# Patient Record
Sex: Female | Born: 1995 | Race: White | Hispanic: No | Marital: Single | Smoking: Current every day smoker
Health system: Southern US, Community
[De-identification: ages and names within clinical notes are randomized; demographics above are authoritative.]

## PROBLEM LIST (undated history)

## (undated) DIAGNOSIS — F329 Major depressive disorder, single episode, unspecified: Secondary | ICD-10-CM

## (undated) DIAGNOSIS — F410 Panic disorder [episodic paroxysmal anxiety] without agoraphobia: Secondary | ICD-10-CM

## (undated) DIAGNOSIS — F319 Bipolar disorder, unspecified: Secondary | ICD-10-CM

## (undated) DIAGNOSIS — F32A Depression, unspecified: Secondary | ICD-10-CM

## (undated) DIAGNOSIS — L732 Hidradenitis suppurativa: Secondary | ICD-10-CM

## (undated) DIAGNOSIS — F419 Anxiety disorder, unspecified: Secondary | ICD-10-CM

## (undated) DIAGNOSIS — N730 Acute parametritis and pelvic cellulitis: Secondary | ICD-10-CM

## (undated) DIAGNOSIS — Z789 Other specified health status: Secondary | ICD-10-CM

## (undated) DIAGNOSIS — M1990 Unspecified osteoarthritis, unspecified site: Secondary | ICD-10-CM

## (undated) DIAGNOSIS — F209 Schizophrenia, unspecified: Secondary | ICD-10-CM

## (undated) HISTORY — DX: Bipolar disorder, unspecified: F31.9

---
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
## 2017-01-04 ENCOUNTER — Encounter (HOSPITAL_COMMUNITY): Payer: Self-pay | Admitting: Emergency Medicine

## 2017-01-04 ENCOUNTER — Emergency Department (HOSPITAL_COMMUNITY)
Admission: EM | Admit: 2017-01-04 | Discharge: 2017-01-05 | Disposition: A | Discharge status: Home / Self Care | Payer: BLUE CROSS/BLUE SHIELD | Attending: Emergency Medicine | Admitting: Emergency Medicine

## 2017-01-04 DIAGNOSIS — F32A Depression, unspecified: Secondary | ICD-10-CM | POA: Diagnosis present

## 2017-01-04 DIAGNOSIS — Z5181 Encounter for therapeutic drug level monitoring: Secondary | ICD-10-CM | POA: Insufficient documentation

## 2017-01-04 DIAGNOSIS — F411 Generalized anxiety disorder: Secondary | ICD-10-CM | POA: Diagnosis not present

## 2017-01-04 DIAGNOSIS — F419 Anxiety disorder, unspecified: Secondary | ICD-10-CM | POA: Diagnosis present

## 2017-01-04 DIAGNOSIS — F17200 Nicotine dependence, unspecified, uncomplicated: Secondary | ICD-10-CM | POA: Insufficient documentation

## 2017-01-04 DIAGNOSIS — F39 Unspecified mood [affective] disorder: Secondary | ICD-10-CM | POA: Diagnosis not present

## 2017-01-04 DIAGNOSIS — F329 Major depressive disorder, single episode, unspecified: Secondary | ICD-10-CM | POA: Diagnosis present

## 2017-01-04 HISTORY — DX: Unspecified osteoarthritis, unspecified site: M19.90

## 2017-01-04 NOTE — ED Triage Notes (Addendum)
Pt from home via EMS with complaints of panic attack that has been ongoing intermittently for 2 months. Pt is tearful at time of arrival. Per EMS pt has seen a therapist once, but stopped because she perceived the therapist saying something negative about pt's appearance. Pt states that today has been worse than normal. Pt denies SI/HI. Pt states she has previously was diagnosed with schiophrenia  But she questions the diagnosis because she spent little time with the psychiatrist. Pt has been on different medications, but is currently off meds. Pt is a poor historian and is labile

## 2017-01-05 DIAGNOSIS — F329 Major depressive disorder, single episode, unspecified: Secondary | ICD-10-CM | POA: Diagnosis present

## 2017-01-05 DIAGNOSIS — F411 Generalized anxiety disorder: Secondary | ICD-10-CM | POA: Diagnosis present

## 2017-01-05 DIAGNOSIS — F39 Unspecified mood [affective] disorder: Secondary | ICD-10-CM

## 2017-01-05 DIAGNOSIS — F32A Depression, unspecified: Secondary | ICD-10-CM | POA: Diagnosis present

## 2017-01-05 LAB — CBC WITH DIFFERENTIAL/PLATELET
Basophils Absolute: 0 10*3/uL (ref 0.0–0.1)
Basophils Relative: 0 %
EOS PCT: 0 %
Eosinophils Absolute: 0 10*3/uL (ref 0.0–0.7)
HCT: 40.5 % (ref 36.0–46.0)
Hemoglobin: 14.2 g/dL (ref 12.0–15.0)
LYMPHS ABS: 2.2 10*3/uL (ref 0.7–4.0)
LYMPHS PCT: 16 %
MCH: 30.8 pg (ref 26.0–34.0)
MCHC: 35.1 g/dL (ref 30.0–36.0)
MCV: 87.9 fL (ref 78.0–100.0)
MONO ABS: 0.9 10*3/uL (ref 0.1–1.0)
MONOS PCT: 7 %
Neutro Abs: 10.3 10*3/uL — ABNORMAL HIGH (ref 1.7–7.7)
Neutrophils Relative %: 77 %
PLATELETS: 343 10*3/uL (ref 150–400)
RBC: 4.61 MIL/uL (ref 3.87–5.11)
RDW: 12.8 % (ref 11.5–15.5)
WBC: 13.5 10*3/uL — AB (ref 4.0–10.5)

## 2017-01-05 LAB — BASIC METABOLIC PANEL
Anion gap: 10 (ref 5–15)
BUN: 11 mg/dL (ref 6–20)
CALCIUM: 9.5 mg/dL (ref 8.9–10.3)
CO2: 21 mmol/L — AB (ref 22–32)
CREATININE: 0.72 mg/dL (ref 0.44–1.00)
Chloride: 105 mmol/L (ref 101–111)
GFR calc Af Amer: >60 mL/min (ref >60)
GFR calc non Af Amer: >60 mL/min (ref >60)
GLUCOSE: 129 mg/dL — AB (ref 65–99)
Potassium: 3.9 mmol/L (ref 3.5–5.1)
Sodium: 136 mmol/L (ref 135–145)

## 2017-01-05 LAB — RAPID URINE DRUG SCREEN, HOSP PERFORMED
AMPHETAMINES: NOT DETECTED
Barbiturates: NOT DETECTED
Benzodiazepines: NOT DETECTED
Cocaine: NOT DETECTED
Opiates: NOT DETECTED
TETRAHYDROCANNABINOL: POSITIVE — AB

## 2017-01-05 LAB — SALICYLATE LEVEL: Salicylate Lvl: <7 mg/dL (ref 2.8–30.0)

## 2017-01-05 LAB — ACETAMINOPHEN LEVEL: Acetaminophen (Tylenol), Serum: <10 ug/mL — ABNORMAL LOW (ref 10–30)

## 2017-01-05 LAB — PREGNANCY, URINE: PREG TEST UR: NEGATIVE

## 2017-01-05 LAB — ETHANOL: Alcohol, Ethyl (B): <5 mg/dL (ref <5)

## 2017-01-05 MED ORDER — ONDANSETRON HCL 4 MG PO TABS: 4.0000 mg | ORAL_TABLET | Freq: Three times a day (TID) | ORAL | Status: DC | PRN

## 2017-01-05 MED ORDER — HYDROXYZINE HCL 25 MG PO TABS: 25.0000 mg | ORAL_TABLET | Freq: Four times a day (QID) | ORAL | Status: DC | PRN

## 2017-01-05 MED ORDER — CARBAMAZEPINE ER 200 MG PO TB12: 200.0000 mg | ORAL_TABLET | Freq: Two times a day (BID) | ORAL | Status: DC

## 2017-01-05 MED ORDER — CARBAMAZEPINE ER 200 MG PO TB12: 200.0000 mg | ORAL_TABLET | Freq: Two times a day (BID) | ORAL | 0 refills | Status: DC

## 2017-01-05 MED ORDER — LORAZEPAM 1 MG PO TABS: 1.0000 mg | ORAL_TABLET | Freq: Three times a day (TID) | ORAL | Status: DC | PRN

## 2017-01-05 MED ORDER — ACETAMINOPHEN 325 MG PO TABS: 650.0000 mg | ORAL_TABLET | ORAL | Status: DC | PRN

## 2017-01-05 MED ORDER — HYDROXYZINE HCL 25 MG PO TABS: 25.0000 mg | ORAL_TABLET | Freq: Four times a day (QID) | ORAL | 0 refills | Status: DC | PRN

## 2017-01-05 MED ORDER — LORAZEPAM 1 MG PO TABS
1.0000 mg | ORAL_TABLET | Freq: Once | ORAL | Status: AC
  Administered 2017-01-05: 1 mg via ORAL
  Filled 2017-01-05: qty 1

## 2017-01-05 NOTE — ED Notes (Signed)
Pt appears calm, cooperative, and friendly. Pt friend and mother are at the bedside.

## 2017-01-05 NOTE — [facility] Suicide Risk Assessment (Signed)
Suicide Risk Assessment  Discharge Assessment   [facility]   Principal Problem: Generalized anxiety disorder Discharge Diagnoses:  Patient Active Problem List   Diagnosis Date Noted  . Generalized anxiety disorder [F41.1] 01/05/2017    Priority: High  . Mood disorder (HCC) [F39] 01/05/2017    Priority: High    Total Time spent with patient: 45 minutes  Musculoskeletal: Strength & Muscle Tone: within normal limits Gait & Station: normal Patient leans: N/A  Psychiatric Specialty Exam:   Blood pressure 118/79, pulse 90, temperature 98.9 F (37.2 C), resp. rate 15, height 5\' 4"  (1.626 m), weight 57.5 kg (126 lb 12 oz), SpO2 98 %.Body mass index is 21.76 kg/m.  General Appearance: Casual  Eye Contact::  Good  Speech:  Normal Rate409  Volume:  Normal  Mood:  Anxious  Affect:  Congruent  Thought Process:  Coherent and Descriptions of Associations: Intact  Orientation:  Full (Time, Place, and Person)  Thought Content:  WDL and Logical  Suicidal Thoughts:  No  Homicidal Thoughts:  No  Memory:  Immediate;   Good Recent;   Good Remote;   Good  Judgement:  Fair  Insight:  Fair  Psychomotor Activity:  Normal  Concentration:  Good  Recall:  Good  Fund of Knowledge:Good  Language: Good  Akathisia:  No  Handed:  Right  AIMS (if indicated):     Assets:  Housing Leisure Time Physical Health Resilience Social Support  Sleep:     Cognition: WNL  ADL's:  Intact   Mental Status Per Nursing Assessment::   On Admission:   anxiety, panic attack.  Appointment in place for 4/4, Rx provided  Demographic Factors:  Adolescent or young adult and Caucasian  Loss Factors: NA  Historical Factors: NA  Risk Reduction Factors:   Sense of responsibility to family, Living with another person, especially a relative and Positive social support  Continued Clinical Symptoms:  Anxiety   Cognitive Features That Contribute To Risk:  None    Suicide Risk:   Minimal: No identifiable suicidal ideation.  Patients presenting with no risk factors but with morbid ruminations; may be classified as minimal risk based on the severity of the depressive symptoms    Plan Of Care/Follow-up recommendations:  Activity:  as tolerated Diet:  heart healthy diet  Brecken Walth, NP 01/05/2017, 11:38 AM

## 2017-01-05 NOTE — ED Provider Notes (Signed)
[facility]-EMER[facility]ENCY DEPT Provider Note   CSN: [id number] Arrival date & time: 01/04/17  2336     History   Chief Complaint Chief Complaint  Patient presents with  . Panic Attack    HPI Kimberly Boyle is a 21 y.o. female.  Patient presents to the ED with a chief complaint of anxiety.  She states that it feels like she has been having a panic attack for the past several months. She reports a history of schizophrenia, but is not actively being treated. She states that she likes to talk to someone. She states that she has many intrusive thoughts, but denies any suicidal or homicidal ideation. States that she had some tingling in her fingertips which she associates with panic attack.She denies any other associated symptoms or modifying factors. She has not taken anything for symptoms. She smokes marijauna. She denies any other drug use.   The history is provided by the patient. No language interpreter was used.    Past Medical History:  Diagnosis Date  . Arthritis     There are no active problems to display for this patient.   History reviewed. No pertinent surgical history.  OB History    No data available       Home Medications    Prior to Admission medications   Not on File    Family History No family history on file.  Social History Social History  Substance Use Topics  . Smoking status: Current Every Day Smoker  . Smokeless tobacco: Former Neurosurgeon  . Alcohol use No     Allergies   Penicillins   Review of Systems Review of Systems  All other systems reviewed and are negative.    Physical Exam Updated Vital Signs BP (!) 117/42 (BP Location: Left Arm)   Pulse 69   Temp 98.6 F (37 C) (Oral)   Resp 18   Ht 5\' 4"  (1.626 m)   Wt 57.5 kg   SpO2 100%   BMI 21.76 kg/m   Physical Exam  Constitutional: She is oriented to person, place, and time. She appears well-developed and well-nourished.  HENT:  Head: Normocephalic and atraumatic.  Eyes:  Conjunctivae and EOM are normal. Pupils are equal, round, and reactive to light.  Neck: Normal range of motion. Neck supple.  Cardiovascular: Normal rate and regular rhythm.  Exam reveals no gallop and no friction rub.   No murmur heard. Pulmonary/Chest: Effort normal and breath sounds normal. No respiratory distress. She has no wheezes. She has no rales. She exhibits no tenderness.  Abdominal: Soft. Bowel sounds are normal. She exhibits no distension and no mass. There is no tenderness. There is no rebound and no guarding.  Musculoskeletal: Normal range of motion. She exhibits no edema or tenderness.  Neurological: She is alert and oriented to person, place, and time.  Skin: Skin is warm and dry.  Psychiatric:  Anxious Possibly manic Tangential thoughts  Nursing note and vitals reviewed.    ED Treatments / Results  Labs (all labs ordered are listed, but only abnormal results are displayed) Labs Reviewed  CBC WITH DIFFERENTIAL/PLATELET  BASIC METABOLIC PANEL  ETHANOL  SALICYLATE LEVEL  ACETAMINOPHEN LEVEL    EKG  EKG Interpretation None       Radiology No results found.  Procedures Procedures (including critical care time)  Medications Ordered in ED Medications - No data to display   Initial Impression / Assessment and Plan / ED Course  I have reviewed the triage vital signs and  the nursing notes.  Pertinent labs & imaging results that were available during my care of the patient were reviewed by me and considered in my medical decision making (see chart for details).     Patient with panic attack 2-3 months. I think that this is less likely a panic attack, and more likely chronic underlying behavioral disorder.  She reports history of schizophrenia. Question whether she has some bipolar/mania as well. Patient would like to speak with a psychiatrist. I think this is reasonable given the patient's current presentation. Will check labs and consult  TTS.  Nonspecific leukocytosis.  VSS.  Afebrile.  Medically clear.  TTS consult pending  Final Clinical Impressions(s) / ED Diagnoses   Final diagnoses:  None    New Prescriptions New Prescriptions   No medications on file     Roxy HorsemanRobert Shantell Belongia, PA-C 01/05/17 0034    Roxy Horsemanobert Brielyn Bosak, PA-C 01/05/17 0104    Rolland PorterMark James, MD 01/13/17 1229
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
## 2017-08-19 ENCOUNTER — Other Ambulatory Visit: Payer: Self-pay

## 2017-08-19 ENCOUNTER — Emergency Department
Admission: EM | Admit: 2017-08-19 | Discharge: 2017-08-21 | Disposition: A | Discharge status: Other Acute Inpatient Hospital | Payer: BLUE CROSS/BLUE SHIELD | Attending: Student in an Organized Health Care Education/Training Program | Admitting: Student in an Organized Health Care Education/Training Program

## 2017-08-19 ENCOUNTER — Encounter: Payer: Self-pay | Admitting: Emergency Medicine

## 2017-08-19 DIAGNOSIS — R740 Nonspecific elevation of levels of transaminase and lactic acid dehydrogenase [LDH]: Secondary | ICD-10-CM | POA: Insufficient documentation

## 2017-08-19 DIAGNOSIS — Z7289 Other problems related to lifestyle: Secondary | ICD-10-CM

## 2017-08-19 DIAGNOSIS — F17200 Nicotine dependence, unspecified, uncomplicated: Secondary | ICD-10-CM | POA: Diagnosis not present

## 2017-08-19 DIAGNOSIS — R7401 Elevation of levels of liver transaminase levels: Secondary | ICD-10-CM

## 2017-08-19 DIAGNOSIS — T71162A Asphyxiation due to hanging, intentional self-harm, initial encounter: Secondary | ICD-10-CM | POA: Insufficient documentation

## 2017-08-19 DIAGNOSIS — X781XXA Intentional self-harm by knife, initial encounter: Secondary | ICD-10-CM | POA: Insufficient documentation

## 2017-08-19 DIAGNOSIS — F39 Unspecified mood [affective] disorder: Secondary | ICD-10-CM | POA: Insufficient documentation

## 2017-08-19 DIAGNOSIS — X838XXA Intentional self-harm by other specified means, initial encounter: Secondary | ICD-10-CM

## 2017-08-19 DIAGNOSIS — Y939 Activity, unspecified: Secondary | ICD-10-CM | POA: Diagnosis not present

## 2017-08-19 DIAGNOSIS — T07XXXA Unspecified multiple injuries, initial encounter: Secondary | ICD-10-CM

## 2017-08-19 DIAGNOSIS — F1210 Cannabis abuse, uncomplicated: Secondary | ICD-10-CM | POA: Insufficient documentation

## 2017-08-19 DIAGNOSIS — T391X1A Poisoning by 4-Aminophenol derivatives, accidental (unintentional), initial encounter: Secondary | ICD-10-CM

## 2017-08-19 DIAGNOSIS — F333 Major depressive disorder, recurrent, severe with psychotic symptoms: Secondary | ICD-10-CM

## 2017-08-19 DIAGNOSIS — X789XXA Intentional self-harm by unspecified sharp object, initial encounter: Secondary | ICD-10-CM

## 2017-08-19 DIAGNOSIS — T1491XA Suicide attempt, initial encounter: Secondary | ICD-10-CM | POA: Insufficient documentation

## 2017-08-19 DIAGNOSIS — Y92009 Unspecified place in unspecified non-institutional (private) residence as the place of occurrence of the external cause: Secondary | ICD-10-CM | POA: Insufficient documentation

## 2017-08-19 DIAGNOSIS — S61512A Laceration without foreign body of left wrist, initial encounter: Secondary | ICD-10-CM | POA: Diagnosis not present

## 2017-08-19 DIAGNOSIS — F1220 Cannabis dependence, uncomplicated: Secondary | ICD-10-CM

## 2017-08-19 DIAGNOSIS — T71192A Asphyxiation due to mechanical threat to breathing due to other causes, intentional self-harm, initial encounter: Secondary | ICD-10-CM

## 2017-08-19 DIAGNOSIS — Y999 Unspecified external cause status: Secondary | ICD-10-CM | POA: Diagnosis not present

## 2017-08-19 DIAGNOSIS — F419 Anxiety disorder, unspecified: Secondary | ICD-10-CM | POA: Insufficient documentation

## 2017-08-19 DIAGNOSIS — S61519A Laceration without foreign body of unspecified wrist, initial encounter: Secondary | ICD-10-CM

## 2017-08-19 HISTORY — DX: Anxiety disorder, unspecified: F41.9

## 2017-08-19 HISTORY — DX: Major depressive disorder, single episode, unspecified: F32.9

## 2017-08-19 LAB — CBC WITH DIFFERENTIAL/PLATELET
BASOS ABS: 0 10*3/uL (ref 0–0.1)
Basophils Relative: 0 %
Eosinophils Absolute: 0 10*3/uL (ref 0–0.7)
Eosinophils Relative: 0 %
HCT: 41.1 % (ref 35.0–47.0)
HEMOGLOBIN: 13.8 g/dL (ref 12.0–16.0)
LYMPHS PCT: 6 %
Lymphs Abs: 0.6 10*3/uL — ABNORMAL LOW (ref 1.0–3.6)
MCH: 30.8 pg (ref 26.0–34.0)
MCHC: 33.5 g/dL (ref 32.0–36.0)
MCV: 91.8 fL (ref 80.0–100.0)
Monocytes Absolute: 0.4 10*3/uL (ref 0.2–0.9)
Monocytes Relative: 4 %
NEUTROS ABS: 9.9 10*3/uL — AB (ref 1.4–6.5)
Neutrophils Relative %: 90 %
Platelets: 179 10*3/uL (ref 150–440)
RBC: 4.48 MIL/uL (ref 3.80–5.20)
RDW: 13 % (ref 11.5–14.5)
WBC: 11 10*3/uL (ref 3.6–11.0)

## 2017-08-19 LAB — COMPREHENSIVE METABOLIC PANEL
ALT: 3817 U/L — ABNORMAL HIGH (ref 14–54)
AST: 2834 U/L — AB (ref 15–41)
Albumin: 4 g/dL (ref 3.5–5.0)
Alkaline Phosphatase: 62 U/L (ref 38–126)
Anion gap: 12 (ref 5–15)
BILIRUBIN TOTAL: 1.2 mg/dL (ref 0.3–1.2)
BUN: 7 mg/dL (ref 6–20)
CO2: 25 mmol/L (ref 22–32)
CREATININE: 0.75 mg/dL (ref 0.44–1.00)
Calcium: 8.8 mg/dL — ABNORMAL LOW (ref 8.9–10.3)
Chloride: 99 mmol/L — ABNORMAL LOW (ref 101–111)
GFR calc Af Amer: >60 mL/min (ref >60)
Glucose, Bld: 190 mg/dL — ABNORMAL HIGH (ref 65–99)
Potassium: 3.1 mmol/L — ABNORMAL LOW (ref 3.5–5.1)
Sodium: 136 mmol/L (ref 135–145)
Total Protein: 6.5 g/dL (ref 6.5–8.1)

## 2017-08-19 LAB — URINE DRUG SCREEN, QUALITATIVE (ARMC ONLY)
Amphetamines, Ur Screen: NOT DETECTED
Barbiturates, Ur Screen: NOT DETECTED
Benzodiazepine, Ur Scrn: NOT DETECTED
Cannabinoid 50 Ng, Ur ~~LOC~~: POSITIVE — AB
Cocaine Metabolite,Ur ~~LOC~~: NOT DETECTED
MDMA (Ecstasy)Ur Screen: NOT DETECTED
Methadone Scn, Ur: NOT DETECTED
Opiate, Ur Screen: NOT DETECTED
Phencyclidine (PCP) Ur S: NOT DETECTED
Tricyclic, Ur Screen: NOT DETECTED

## 2017-08-19 LAB — URINALYSIS, COMPLETE (UACMP) WITH MICROSCOPIC
Bilirubin Urine: NEGATIVE
Glucose, UA: 150 mg/dL — AB
Ketones, ur: 5 mg/dL — AB
Leukocytes, UA: NEGATIVE
Nitrite: NEGATIVE
Protein, ur: 30 mg/dL — AB
Specific Gravity, Urine: 1.008 (ref 1.005–1.030)
pH: 6 (ref 5.0–8.0)

## 2017-08-19 LAB — SALICYLATE LEVEL: Salicylate Lvl: <7 mg/dL (ref 2.8–30.0)

## 2017-08-19 LAB — ACETAMINOPHEN LEVEL

## 2017-08-19 MED ORDER — CEPHALEXIN 500 MG PO CAPS
500.0000 mg | ORAL_CAPSULE | Freq: Three times a day (TID) | ORAL | Status: DC
  Administered 2017-08-19 – 2017-08-21 (×5): 500 mg via ORAL
  Filled 2017-08-19 (×7): qty 1

## 2017-08-19 MED ORDER — BACITRACIN ZINC 500 UNIT/GM EX OINT
TOPICAL_OINTMENT | CUTANEOUS | Status: AC
Start: 2017-08-19 — End: 2017-08-19
  Administered 2017-08-19: 1 via TOPICAL
  Filled 2017-08-19: qty 6.3

## 2017-08-19 MED ORDER — BACITRACIN ZINC 500 UNIT/GM EX OINT
TOPICAL_OINTMENT | CUTANEOUS | Status: DC | PRN
  Administered 2017-08-19: 1 via TOPICAL

## 2017-08-19 MED ORDER — BUPIVACAINE HCL (PF) 0.5 % IJ SOLN
30.0000 mL | Freq: Once | INTRAMUSCULAR | Status: AC
Start: 2017-08-19 — End: 2017-08-19
  Administered 2017-08-19: 30 mL
  Filled 2017-08-19: qty 30
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
## 2017-08-19 NOTE — ED Notes (Addendum)
Patient has given permission to speak and update mother on medical conditions, psychiatric care, and her treatment plans.  Mother given password for patient access.

## 2017-08-19 NOTE — ED Provider Notes (Signed)
[facility] Emergency Department Provider Note    None    (approximate)  I have reviewed the triage vital signs and the nursing notes.   HISTORY  Chief Complaint Suicidal and Laceration    HPI Kimberly Boyle is a 21 y.o. female who presents via EMS after suicide attempt.  Patient is 0 reportedly been trying to hang herself with it by wrapping necktie around her neck and pulling on it to strangulate herself.  Patient also has been cutting her left wrist.  According to EMS the family found her trying to choke herself and were able to stop her.  She then ran around the house trying to get kitchen knives to cut herself.  They are agreeable to get these away from her and they drove her to the church to ask for help and they called EMS.  Past Medical History:  Diagnosis Date  . Anxiety   . Arthritis   . Major depressive disorder    No family history on file. History reviewed. No pertinent surgical history. Patient Active Problem List   Diagnosis Date Noted  . Generalized anxiety disorder 01/05/2017  . Mood disorder (HCC) 01/05/2017      Prior to Admission medications   Medication Sig Start Date End Date Taking? Authorizing Provider  carbamazepine (TEGRETOL XR) 200 MG 12 hr tablet Take 1 tablet (200 mg total) by mouth 2 (two) times daily. 01/05/17   Charm Rings, NP  hydrOXYzine (ATARAX/VISTARIL) 25 MG tablet Take 1 tablet (25 mg total) by mouth every 6 (six) hours as needed for anxiety. 01/05/17   Charm Rings, NP    Allergies Penicillins    Social History Social History   Tobacco Use  . Smoking status: Current Every Day Smoker  . Smokeless tobacco: Former Engineer, water Use Topics  . Alcohol use: No  . Drug use: Yes    Types: Marijuana    Review of Systems Patient denies headaches, rhinorrhea, blurry vision, numbness, shortness of breath, chest pain, edema, cough, abdominal pain, nausea, vomiting, diarrhea, dysuria, fevers, rashes or  hallucinations unless otherwise stated above in HPI. ____________________________________________   PHYSICAL EXAM:  VITAL SIGNS: Vitals:   08/19/17 2129  BP: 124/83  Pulse: 94  Resp: 18  Temp: 98.7 F (37.1 C)  SpO2: 98%    Constitutional: Alert and oriented. in no acute distress. Eyes: Conjunctivae are normal.  Head: Atraumatic. Nose: No congestion/rhinnorhea. Mouth/Throat: Mucous membranes are moist.   Neck: No stridor. Painless ROM., no contusion, small superficial linear abrasion to right anteiror neck  Cardiovascular: Normal rate, regular rhythm. Grossly normal heart sounds.  Good peripheral circulation. Respiratory: Normal respiratory effort.  No retractions. Lungs CTAB. Gastrointestinal: Soft and nontender. No distention. No abdominal bruits. No CVA tenderness. Genitourinary:  Musculoskeletal: No lower extremity tenderness nor edema.  No joint effusions. Neurologic:  Normal speech and language. No gross focal neurologic deficits are appreciated. No facial droop Skin: Innumerable lacerations to the left volar forearm she has 6 poorly approximated deep lacerations through the subcutaneous tissue.  No tendon involvement.  They are hemostatic and appear to be acute.  Purulence but there is surrounding erythema.Marland Kitchen Psychiatric: Withdrawn with bizarre affect.  ____________________________________________   LABS (all labs ordered are listed, but only abnormal results are displayed)  Results for orders placed or performed during the hospital encounter of 08/19/17 (from the past 24 hour(s))  CBC with Differential/Platelet     Status: Abnormal   Collection Time: 08/19/17  9:31  PM  Result Value Ref Range   WBC 11.0 3.6 - 11.0 K/uL   RBC 4.48 3.80 - 5.20 MIL/uL   Hemoglobin 13.8 12.0 - 16.0 g/dL   HCT 82.941.1 56.235.0 - 13.047.0 %   MCV 91.8 80.0 - 100.0 fL   MCH 30.8 26.0 - 34.0 pg   MCHC 33.5 32.0 - 36.0 g/dL   RDW 86.513.0 78.411.5 - 69.614.5 %   Platelets 179 150 - 440 K/uL   Neutrophils  Relative % 90 %   Neutro Abs 9.9 (H) 1.4 - 6.5 K/uL   Lymphocytes Relative 6 %   Lymphs Abs 0.6 (L) 1.0 - 3.6 K/uL   Monocytes Relative 4 %   Monocytes Absolute 0.4 0.2 - 0.9 K/uL   Eosinophils Relative 0 %   Eosinophils Absolute 0.0 0 - 0.7 K/uL   Basophils Relative 0 %   Basophils Absolute 0.0 0 - 0.1 K/uL  Comprehensive metabolic panel     Status: Abnormal   Collection Time: 08/19/17  9:31 PM  Result Value Ref Range   Sodium 136 135 - 145 mmol/L   Potassium 3.1 (L) 3.5 - 5.1 mmol/L   Chloride 99 (L) 101 - 111 mmol/L   CO2 25 22 - 32 mmol/L   Glucose, Bld 190 (H) 65 - 99 mg/dL   BUN 7 6 - 20 mg/dL   Creatinine, Ser 2.950.75 0.44 - 1.00 mg/dL   Calcium 8.8 (L) 8.9 - 10.3 mg/dL   Total Protein 6.5 6.5 - 8.1 g/dL   Albumin 4.0 3.5 - 5.0 g/dL   AST 2,8412,834 (H) 15 - 41 U/L   ALT 3,817 (H) 14 - 54 U/L   Alkaline Phosphatase 62 38 - 126 U/L   Total Bilirubin 1.2 0.3 - 1.2 mg/dL   GFR calc non Af Amer >60 >60 mL/min   GFR calc Af Amer >60 >60 mL/min   Anion gap 12 5 - 15  Urine Drug Screen, Qualitative ([facility] only)     Status: Abnormal   Collection Time: 08/19/17  9:31 PM  Result Value Ref Range   Tricyclic, Ur Screen NONE DETECTED NONE DETECTED   Amphetamines, Ur Screen NONE DETECTED NONE DETECTED   MDMA (Ecstasy)Ur Screen NONE DETECTED NONE DETECTED   Cocaine Metabolite,Ur [place] NONE DETECTED NONE DETECTED   Opiate, Ur Screen NONE DETECTED NONE DETECTED   Phencyclidine (PCP) Ur S NONE DETECTED NONE DETECTED   Cannabinoid 50 Ng, Ur [place] POSITIVE (A) NONE DETECTED   Barbiturates, Ur Screen NONE DETECTED NONE DETECTED   Benzodiazepine, Ur Scrn NONE DETECTED NONE DETECTED   Methadone Scn, Ur NONE DETECTED NONE DETECTED  Urinalysis, Complete w Microscopic     Status: Abnormal   Collection Time: 08/19/17  9:31 PM  Result Value Ref Range   Color, Urine YELLOW (A) YELLOW   APPearance HAZY (A) CLEAR   Specific Gravity, Urine 1.008 1.005 - 1.030   pH 6.0 5.0 - 8.0   Glucose, UA 150 (A)  NEGATIVE mg/dL   Hgb urine dipstick MODERATE (A) NEGATIVE   Bilirubin Urine NEGATIVE NEGATIVE   Ketones, ur 5 (A) NEGATIVE mg/dL   Protein, ur 30 (A) NEGATIVE mg/dL   Nitrite NEGATIVE NEGATIVE   Leukocytes, UA NEGATIVE NEGATIVE   RBC / HPF 0-5 0 - 5 RBC/hpf   WBC, UA 0-5 0 - 5 WBC/hpf   Bacteria, UA RARE (A) NONE SEEN   Squamous Epithelial / LPF 6-30 (A) NONE SEEN   Mucus PRESENT   Acetaminophen level  Status: Abnormal   Collection Time: 08/19/17  9:31 PM  Result Value Ref Range   Acetaminophen (Tylenol), Serum <10 (L) 10 - 30 ug/mL  Salicylate level     Status: None   Collection Time: 08/19/17  9:31 PM  Result Value Ref Range   Salicylate Lvl <7.0 2.8 - 30.0 mg/dL   ____________________________________________  EKG My review and personal interpretation at Time: 21:36   Indication: psych  Rate: 95  Rhythm: sinus Axis: normal Other: normal intervals, no stemi ____________________________________________  RADIOLOGY   ____________________________________________   PROCEDURES  Procedure(s) performed:  Marland Kitchen.Marland Kitchen.Laceration Repair Date/Time: 08/19/2017 11:24 PM Performed by: Willy Eddyobinson, Tanai Bouler, MD Authorized by: Willy Eddyobinson, Keawe Marcello, MD   Consent:    Consent obtained:  Verbal   Consent given by:  Patient   Risks discussed:  Infection, need for additional repair, nerve damage, poor cosmetic result, poor wound healing and pain   Alternatives discussed:  Delayed treatment and no treatment Anesthesia (see MAR for exact dosages):    Anesthesia method:  Local infiltration   Local anesthetic:  Bupivacaine 0.5% w/o epi Laceration details:    Location:  Shoulder/arm   Shoulder/arm location:  L lower arm   Wound length (cm): 6 lacerations in horizontal orientation totalling 15 cm.   Depth (mm):  5 Repair type:    Repair type:  Complex Exploration:    Limited defect created (wound extended): no     Wound exploration: wound explored through full range of motion and entire depth of  wound probed and visualized     Wound extent: no foreign bodies/material noted, no nerve damage noted and no tendon damage noted     Contaminated: yes   Treatment:    Area cleansed with:  Hibiclens   Amount of cleaning:  Extensive   Irrigation method:  Syringe   Visualized foreign bodies/material removed: no     Debridement:  Moderate   Undermining:  None Subcutaneous repair:    Suture size:  5-0   Suture material:  Vicryl   Number of sutures:  6 Skin repair:    Repair method:  Sutures   Suture size:  4-0   Suture material:  Nylon   Number of sutures:  29 Approximation:    Approximation:  Loose   Vermilion border: well-aligned   Post-procedure details:    Dressing:  Antibiotic ointment, non-adherent dressing and bulky dressing   Patient tolerance of procedure:  Tolerated well, no immediate complications      Critical Care performed: no ____________________________________________   INITIAL IMPRESSION / ASSESSMENT AND PLAN / ED COURSE  Pertinent labs & imaging results that were available during my care of the patient were reviewed by me and considered in my medical decision making (see chart for details).  DDX: Psychosis, delirium, medication effect, noncompliance, polysubstance abuse, Si, Hi, depression   Neal DySarah B Auman is a 21 y.o. who presents to the ED with extensive lacerations as described above as well concern for strangulation injury.  She has no stridor no hard or soft signs of deep space neck injury.  Wound appears to be old therefore I do suspect some component of this being repetitive cell facet asphyxiation attempts.  Laceration repaired as above.  Blood work sent for the above differential.  Patient clearly suffering some acute psychotic or mental breakdown and is clearly a danger to herself therefore she was IVC and will be kept in the hospital for evaluation by psychiatry.     ____________________________________________   FINAL CLINICAL IMPRESSION(S) /  ED DIAGNOSES  Final diagnoses:  Deliberate self-cutting  Intentional self-harm by strangulation [facility])  Multiple lacerations      NEW MEDICATIONS STARTED DURING THIS VISIT:  This SmartLink is deprecated. Use AVSMEDLIST instead to display the medication list for a patient.   Note:  This document was prepared using Dragon voice recognition software and may include unintentional dictation errors.    Willy Eddy, MD 08/19/17 2328

## 2017-08-19 NOTE — ED Notes (Signed)
Physician at bedside suturing patient lacerations.  Patient tolerating this procedure well at this time with discomfort noted.

## 2017-08-19 NOTE — ED Notes (Signed)
Patient mother is Jason NestDeborah Curington and can be reached at [phone].

## 2017-08-19 NOTE — ED Notes (Signed)
ED Provider at bedside. 

## 2017-08-20 ENCOUNTER — Emergency Department: Payer: BLUE CROSS/BLUE SHIELD

## 2017-08-20 DIAGNOSIS — F333 Major depressive disorder, recurrent, severe with psychotic symptoms: Secondary | ICD-10-CM

## 2017-08-20 DIAGNOSIS — F1220 Cannabis dependence, uncomplicated: Secondary | ICD-10-CM

## 2017-08-20 DIAGNOSIS — X789XXA Intentional self-harm by unspecified sharp object, initial encounter: Secondary | ICD-10-CM

## 2017-08-20 DIAGNOSIS — T391X1A Poisoning by 4-Aminophenol derivatives, accidental (unintentional), initial encounter: Secondary | ICD-10-CM

## 2017-08-20 DIAGNOSIS — S61519A Laceration without foreign body of unspecified wrist, initial encounter: Secondary | ICD-10-CM

## 2017-08-20 DIAGNOSIS — T1491XA Suicide attempt, initial encounter: Secondary | ICD-10-CM

## 2017-08-20 LAB — COMPREHENSIVE METABOLIC PANEL
ALK PHOS: 57 U/L (ref 38–126)
ALT: 3459 U/L — AB (ref 14–54)
AST: 1972 U/L — ABNORMAL HIGH (ref 15–41)
Albumin: 3.6 g/dL (ref 3.5–5.0)
Anion gap: 7 (ref 5–15)
BILIRUBIN TOTAL: 0.6 mg/dL (ref 0.3–1.2)
BUN: 6 mg/dL (ref 6–20)
CALCIUM: 8.4 mg/dL — AB (ref 8.9–10.3)
CO2: 25 mmol/L (ref 22–32)
CREATININE: 0.62 mg/dL (ref 0.44–1.00)
Chloride: 105 mmol/L (ref 101–111)
Glucose, Bld: 88 mg/dL (ref 65–99)
Potassium: 3.3 mmol/L — ABNORMAL LOW (ref 3.5–5.1)
Sodium: 137 mmol/L (ref 135–145)
Total Protein: 6 g/dL — ABNORMAL LOW (ref 6.5–8.1)

## 2017-08-20 LAB — PROTIME-INR
INR: 1.47
Prothrombin Time: 17.7 seconds — ABNORMAL HIGH (ref 11.4–15.2)

## 2017-08-20 LAB — AMMONIA: Ammonia: 17 umol/L (ref 9–35)

## 2017-08-20 LAB — CARBAMAZEPINE LEVEL, TOTAL: Carbamazepine Lvl: <2 ug/mL — ABNORMAL LOW (ref 4.0–12.0)

## 2017-08-20 MED ORDER — ALBUTEROL SULFATE (2.5 MG/3ML) 0.083% IN NEBU
2.5000 mg | INHALATION_SOLUTION | Freq: Once | RESPIRATORY_TRACT | Status: AC
  Administered 2017-08-20: 2.5 mg via RESPIRATORY_TRACT

## 2017-08-20 MED ORDER — ALBUTEROL SULFATE (2.5 MG/3ML) 0.083% IN NEBU
INHALATION_SOLUTION | RESPIRATORY_TRACT | Status: AC
  Filled 2017-08-20: qty 3

## 2017-08-20 MED ORDER — ALBUTEROL SULFATE (5 MG/ML) 0.5% IN NEBU: 2.5000 mg | INHALATION_SOLUTION | RESPIRATORY_TRACT | Status: DC | PRN

## 2017-08-20 MED ORDER — ALBUTEROL SULFATE (2.5 MG/3ML) 0.083% IN NEBU
2.5000 mg | INHALATION_SOLUTION | RESPIRATORY_TRACT | Status: DC | PRN
  Filled 2017-08-20: qty 3

## 2017-08-20 NOTE — ED Notes (Signed)
Pts mother brought knife with blood on it that pt used to cut herself. Placed in biohazard bag and locked up in the safe upfront.

## 2017-08-20 NOTE — ED Notes (Signed)
Pt mother called and reported she found 3 empty extra strength tylenol bottles under pts bed along with bloody knife. Pts mom did report she believes she took the pills Tuesday because on Tuesday pt was vomiting all day. MD notified.

## 2017-08-20 NOTE — ED Notes (Signed)
Sitter at bedside, nurse gave Patient tray, she does not want to eat at this time, she is sleeping, q 15 minute checks and camera surveillance in progress for safety.

## 2017-08-20 NOTE — ED Notes (Signed)
Poison control says no med reversal necessary due to decreasing levels.

## 2017-08-20 NOTE — ED Notes (Signed)
EKG completed and signed off by Dr. Lenard LancePaduchowski

## 2017-08-20 NOTE — ED Notes (Signed)
Pt. Alert and oriented, warm and dry, in no distress. Pt. Denies  HI, and AVH. Pt states having SI no plan. Patient unable to contract for safety. Patient continues to be a one to one with a sitter. Pt appears to be hearing something but denies when asked. This Clinical research associatewriter talked to patient about the importance of being truthful with staff if she is hearing voices or seeing any hallucinations. Pt states if she sees or hears anything she would let this writer know. Pt. Encouraged to let nursing staff know of any concerns or needs.

## 2017-08-20 NOTE — ED Notes (Signed)
Nurse talked with Patient, and she only would answer a few questions, and would respond very slowly, she has thought blocking but did state " Im worthless, and I know I'm no good, I should be able to do more" patient has poor eye contact, looks down most of the time, she is calm, sitter ordered , and nurse to monitor, camera surveillance in progress for safety.

## 2017-08-20 NOTE — ED Notes (Signed)
Pt is medically cleared per Dr. Pershing ProudSchaevitz.

## 2017-08-20 NOTE — ED Provider Notes (Signed)
-----------------------------------------   11:00 AM on 08/20/2017 -----------------------------------------   Blood pressure (!) 126/95, pulse 79, temperature 99.2 F (37.3 C), temperature source Oral, resp. rate 18, height 5\' 3"  (1.6 m), weight 59.9 kg (132 lb), SpO2 97 %.  The patient had no acute events since last update.  Calm and cooperative at this time.  The patient's liver labs are trending down.  Normal INR today.  Patient now admitting to attempting Tylenol overdose about 1 week ago.  ER staff had also discussed the case with the mother who found 3 bottles, extra strength Tylenol, MDM to the patient's bed.  Mother reports that the patient had been vomiting this past Tuesday or Wednesday.  [facility] with the representative, Beth, who will be checking with the toxicologist but does not believe that the patient will require treatment at this time.  Patient is denying any nausea or vomiting.  I palpated the abdomen and the abdomen is nontender.    Myrna BlazerSchaevitz, Kimberly Traore Matthew, MD 08/20/17 1102

## 2017-08-20 NOTE — BH Assessment (Signed)
Assessment Note  Kimberly Boyle is an 21 y.o. female presenting to the ED voluntarily for suicidal ideations with plan and intent. Patient reportedly was found by her mother asphyxiating herself with a belt tonight.  Per the mother, she did not realize the patient has been cutting herself until she saw it tonight.  Patient's mother states that she noticed she had been withdrawn, not eating, and not sleeping for 5 days. Pt's mother was able to stop pt from strangling herself. However, pt ran to the kitchen and grabbed a knife and cut her forearm.  Pt reports prior hospital[facility] [place].      Diagnosis: Major Depressive Disorder  Past Medical History:  Past Medical History:  Diagnosis Date  . Anxiety   . Arthritis   . Major depressive disorder     History reviewed. No pertinent surgical history.  Family History: No family history on file.  Social History:  reports that she has been smoking.  She has quit using smokeless tobacco. She reports that she uses drugs. Drug: Marijuana. She reports that she does not drink alcohol.  Additional Social History:  Alcohol / Drug Use Pain Medications: See PTA Prescriptions: Se PTA Over the Counter: See PTA History of alcohol / drug use?: No history of alcohol / drug abuse  CIWA: CIWA-Ar BP: 124/83 Pulse Rate: 94 COWS:    Allergies:  Allergies  Allergen Reactions  . Penicillins Nausea Only    Has patient had a PCN reaction causing immediate rash, facial/tongue/throat swelling, SOB or lightheadedness with hypotension:  no Has patient had a PCN reaction causing severe rash involving mucus membranes or skin necrosis:  no Has patient had a PCN reaction that required hospitalization: no Has patient had a PCN reaction occurring within the last 10 years: no If all of the above answers are "NO", then may proceed with Cephalosporin use.     Home Medications:  (Not in a hospital admission)  OB/GYN Status:  No  LMP recorded.  General Assessment Data [facility] Assessment: In system Is this a Tele or Face-to-Face Assessment?: Face-to-Face Is this an Initial Assessment or a Re-assessment for this encounter?: Initial Assessment Marital status: Single Maiden name: n/a Is patient pregnant?: No Pregnancy Status: No Living Arrangements: Parent Can pt return to current living arrangement?: No Admission Status: Voluntary Is patient capable of signing voluntary admission?: Yes Referral Source: Self/Family/Friend Insurance type: BCBS     Crisis Care Plan Living Arrangements: Parent Legal Guardian: Other:(self) Name of Psychiatrist: unknown Name of Therapist: unknown  Education Status Is patient currently in school?: No Current Grade: n/a Highest grade of school patient has completed: hs Name of school: na Contact person: na  Risk to self with the past 6 months Suicidal Ideation: Yes-Currently Present Has patient been a risk to self within the past 6 months prior to admission? : Yes Suicidal Intent: Yes-Currently Present Has patient had any suicidal intent within the past 6 months prior to admission? : Yes Is patient at risk for suicide?: Yes Suicidal Plan?: Yes-Currently Present Has patient had any suicidal plan within the past 6 months prior to admission? : Yes Specify Current Suicidal Plan: Pt tried to strangle herself Access to Means: Yes Specify Access to Suicidal Means: Pt has access to razors and tie to strangle herself What has been your use of drugs/alcohol within the last 12 months?: cannabis Previous Attempts/Gestures: Yes How many times?: 3 Other Self Harm Risks: hx  of cutting Triggers for Past Attempts: Unknown Intentional Self Injurious Behavior: Cutting Comment - Self Injurious Behavior: Pt has hx of cutting her wrists Family Suicide History: No Recent stressful life event(s): Conflict (Comment) Persecutory voices/beliefs?: No Depression:  Yes Depression Symptoms: Loss of interest in usual pleasures, Feeling worthless/self pity, Isolating Substance abuse history and/or treatment for substance abuse?: Yes Suicide prevention information given to non-admitted patients: Not applicable  Risk to Others within the past 6 months Homicidal Ideation: No Does patient have any lifetime risk of violence toward others beyond the six months prior to admission? : No Thoughts of Harm to Others: No Current Homicidal Intent: No Current Homicidal Plan: No Access to Homicidal Means: No Identified Victim: none identified History of harm to others?: No Assessment of Violence: None Noted Violent Behavior Description: none identified Does patient have access to weapons?: No Criminal Charges Pending?: No Does patient have a court date: No Is patient on probation?: No  Psychosis Hallucinations: None noted Delusions: None noted  Mental Status Report Appearance/Hygiene: In scrubs Eye Contact: Poor Motor Activity: Freedom of movement Speech: Slow, Soft Level of Consciousness: Alert Mood: Empty, Depressed Affect: Blunted, Depressed, Sad Anxiety Level: None Thought Processes: Relevant Judgement: Impaired Orientation: Place, Person, Time, Situation Obsessive Compulsive Thoughts/Behaviors: Minimal  Cognitive Functioning Concentration: Normal Memory: Recent Intact, Remote Intact IQ: Average Insight: Poor Impulse Control: Poor Appetite: Poor Weight Loss: 0 Weight Gain: 0 Sleep: Decreased Total Hours of Sleep: 2 Vegetative Symptoms: None  ADLScreening [facility] Assessment Services) Patient's cognitive ability adequate to safely complete daily activities?: Yes Patient able to express need for assistance with ADLs?: Yes Independently performs ADLs?: Yes (appropriate for developmental age)  Prior Inpatient Therapy Prior Inpatient Therapy: Yes Prior Therapy Dates: 2017,2018 Prior Therapy Facilty/Provider(s[facility] [place] Reason  for Treatment: depression  Prior Outpatient Therapy Prior Outpatient Therapy: Yes Prior Therapy Dates: current Prior Therapy Facilty/Provider(s): unknown Reason for Treatment: depression Does patient have an ACCT team?: No Does patient have Intensive In-House Services?  : No Does patient have [facility] services? : No Does patient have P4CC services?: No  ADL Screening (condition at time of admission) Patient's cognitive ability adequate to safely complete daily activities?: Yes Patient able to express need for assistance with ADLs?: Yes Independently performs ADLs?: Yes (appropriate for developmental age)       Abuse/Neglect Assessment (Assessment to be complete while patient is alone) Abuse/Neglect Assessment Can Be Completed: Yes Physical Abuse: Denies Verbal Abuse: Denies Sexual Abuse: Denies Exploitation of patient/patient's resources: Denies Self-Neglect: Denies Values / Beliefs Cultural Requests During Hospitalization: None Spiritual Requests During Hospitalization: None Consults Spiritual Care Consult Needed: No Social Work Consult Needed: No Merchant navy officerAdvance Directives (For Healthcare) Does Patient Have a Medical Advance Directive?: No    Additional Information 1:1 In Past 12 Months?: No CIRT Risk: No Elopement Risk: No Does patient have medical clearance?: Yes     Disposition:  Disposition Initial Assessment Completed for this Encounter: Yes Disposition of Patient: Pending Review with psychiatrist  On Site Evaluation by:   Reviewed with Physician:    Manus Ruddoxana C Jamol Ginyard 08/20/2017 2:30 AM
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
## 2017-08-20 NOTE — ED Notes (Signed)
Patient transferred to room 1, nurse [place]. Patient does have order for sitter, sitter at bedside.

## 2017-08-20 NOTE — ED Provider Notes (Signed)
-----------------------------------------   12:17 AM on 08/20/2017 -----------------------------------------  Noted abnormally elevated AST and ALT.  T bili within normal limits.  Comparison from May 2018 significantly changed.  Patient denies Tylenol ingestion and Tylenol level is within normal limits.  Looks like she is on Tegretol.  Will check Tegretol level, obtain hepatitis panel, evaluate with right upper quadrant ultrasound.  Clinically patient is not jaundiced, not complaining of abdominal pain, nausea or vomiting.  ----------------------------------------- 1:22 AM on 08/20/2017 -----------------------------------------  Noted results of normal ultrasound, subtherapeutic Tegretol level and negative ammonia.  Hepatitis panel is pending.  Patient resting in no acute distress.  ----------------------------------------- 6:43 AM on 08/20/2017 -----------------------------------------  Patient sleeping in no acute distress.  Hepatitis panel is pending.  Disposition per psychiatry.   Irean HongSung, Jade J, MD 08/20/17 [phone]

## 2017-08-20 NOTE — ED Notes (Signed)
Patient did eat 50% of dinner tray, and had beverage.

## 2017-08-20 NOTE — ED Notes (Signed)
Bacitracin applied and rewrapped pts arm.

## 2017-08-20 NOTE — ED Notes (Signed)

## 2017-08-20 NOTE — ED Notes (Signed)
Pt in shower.  

## 2017-08-21 LAB — HEPATITIS PANEL, ACUTE
HCV Ab: <0.1 s/co ratio (ref 0.0–0.9)
HEP B C IGM: NEGATIVE
Hep A IgM: NEGATIVE
Hepatitis B Surface Ag: NEGATIVE

## 2017-08-21 LAB — COMPREHENSIVE METABOLIC PANEL
ALBUMIN: 3.6 g/dL (ref 3.5–5.0)
ALK PHOS: 46 U/L (ref 38–126)
ALT: 2184 U/L — ABNORMAL HIGH (ref 14–54)
AST: 522 U/L — AB (ref 15–41)
Anion gap: 7 (ref 5–15)
BILIRUBIN TOTAL: 1 mg/dL (ref 0.3–1.2)
BUN: 7 mg/dL (ref 6–20)
CALCIUM: 8.4 mg/dL — AB (ref 8.9–10.3)
CO2: 24 mmol/L (ref 22–32)
Chloride: 107 mmol/L (ref 101–111)
Creatinine, Ser: 0.71 mg/dL (ref 0.44–1.00)
GFR calc Af Amer: >60 mL/min (ref >60)
GFR calc non Af Amer: >60 mL/min (ref >60)
GLUCOSE: 87 mg/dL (ref 65–99)
Potassium: 3.8 mmol/L (ref 3.5–5.1)
Sodium: 138 mmol/L (ref 135–145)
TOTAL PROTEIN: 5.8 g/dL — AB (ref 6.5–8.1)

## 2017-08-21 MED ORDER — LORAZEPAM 1 MG PO TABS
1.0000 mg | ORAL_TABLET | Freq: Once | ORAL | Status: AC
  Administered 2017-08-21: 1 mg via ORAL
  Filled 2017-08-21: qty 1

## 2017-08-21 NOTE — ED Provider Notes (Signed)
-----------------------------------------   7:13 AM on 08/21/2017 -----------------------------------------   Blood pressure 121/81, pulse 81, temperature 98 F (36.7 C), temperature source Oral, resp. rate 20, height 1.6 m (5\' 3" ), weight 59.9 kg (132 lb), SpO2 98 %.  No acute overnight events.  Patient accepted to [facility] for transfer after 10:00am today. Completed EMTALA documentation but will require reassessment prior to transfer.   Loleta RoseForbach, Maxten Shuler, MD 08/21/17 [phone]

## 2017-08-21 NOTE — ED Notes (Signed)
emtala reviewed by this RN 

## 2017-08-21 NOTE — ED Notes (Signed)
Pt pleasant and cooperative with staff. Pt with thought blocking and flat, sad affect.  Pt denies SI and AVH.  However, patient does appear to be responding to internal stimuli. Pt often covers her head / ears with pillows. Maintained on 15 minute checks and observation by security camera for safety.

## 2017-08-21 NOTE — ED Provider Notes (Signed)
-----------------------------------------   3:57 PM on 08/21/2017 -----------------------------------------   Blood pressure (!) 102/53, pulse 68, temperature 98.6 F (37 C), temperature source Oral, resp. rate 18, height 5\' 3"  (1.6 m), weight 59.9 kg (132 lb), SpO2 98 %.  The patient had no acute events since last update.  Calm and cooperative at this time.  Disposition is pending Psychiatry/Behavioral Medicine team recommendations.     Arnaldo NatalMalinda, Shavonda Wiedman F, MD 08/21/17 [phone]

## 2017-08-21 NOTE — ED Notes (Signed)
Pt in dayroom visiting with her mother. Pt is calm, flat, sad affect. Maintained on 15 minute checks and observation by security camera for safety.

## 2017-08-21 NOTE — BH Assessment (Signed)
Patient has been accepted by [facility] (Per Annice PihJackie, Admissions Rep). Accepting MD:  Dr. Roselyn Reefreque Call Report: [phone] Pt can arrive after 10 am to:    [facility] [address] [facility] [address] [place] [id number]

## 2017-08-21 NOTE — ED Notes (Signed)
Pt aware of transfer to [facility]. Pt accepting. Patient's mother also aware of transfer. Pt compliant with medication and VS.  No behavioral issues. Maintained on 15 minute checks and observation by security camera for safety.

## 2017-08-21 NOTE — ED Notes (Signed)
Pt complaining of elevated anxiety.  RN notified EDP. 1 mg of Ativan ordered. Maintained on 15 minute checks and observation by security camera for safety.

## 2017-08-21 NOTE — ED Notes (Signed)
Pt transferred to [facility] under [facility].  Pt accepting. All belongings sent with officer. Report called to RN Sasha. No behavioral issues from patient.

## 2017-08-21 NOTE — ED Notes (Signed)
Patient resting quietly in room. No noted distress or abnormal behaviors noted. Will continue 15 minute checks and observation by security camera for safety. 

## 2017-08-31 ENCOUNTER — Other Ambulatory Visit (HOSPITAL_COMMUNITY): Payer: BLUE CROSS/BLUE SHIELD | Attending: Psychiatry | Admitting: Licensed Clinical Social Worker

## 2017-08-31 DIAGNOSIS — F39 Unspecified mood [affective] disorder: Secondary | ICD-10-CM | POA: Insufficient documentation

## 2017-08-31 DIAGNOSIS — F250 Schizoaffective disorder, bipolar type: Secondary | ICD-10-CM | POA: Insufficient documentation

## 2017-08-31 DIAGNOSIS — F411 Generalized anxiety disorder: Secondary | ICD-10-CM | POA: Insufficient documentation

## 2017-08-31 DIAGNOSIS — Z915 Personal history of self-harm: Secondary | ICD-10-CM | POA: Insufficient documentation
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
## 2017-10-12 ENCOUNTER — Emergency Department
Admission: EM | Admit: 2017-10-12 | Discharge: 2017-10-13 | Disposition: A | Discharge status: Home / Self Care | Payer: BLUE CROSS/BLUE SHIELD | Attending: Emergency Medicine | Admitting: Emergency Medicine

## 2017-10-12 ENCOUNTER — Encounter: Payer: Self-pay | Admitting: Emergency Medicine

## 2017-10-12 DIAGNOSIS — F3181 Bipolar II disorder: Secondary | ICD-10-CM

## 2017-10-12 DIAGNOSIS — F329 Major depressive disorder, single episode, unspecified: Secondary | ICD-10-CM | POA: Insufficient documentation

## 2017-10-12 DIAGNOSIS — F39 Unspecified mood [affective] disorder: Secondary | ICD-10-CM

## 2017-10-12 DIAGNOSIS — F209 Schizophrenia, unspecified: Secondary | ICD-10-CM | POA: Insufficient documentation

## 2017-10-12 DIAGNOSIS — F17210 Nicotine dependence, cigarettes, uncomplicated: Secondary | ICD-10-CM | POA: Insufficient documentation

## 2017-10-12 DIAGNOSIS — R451 Restlessness and agitation: Secondary | ICD-10-CM | POA: Insufficient documentation

## 2017-10-12 DIAGNOSIS — F4325 Adjustment disorder with mixed disturbance of emotions and conduct: Secondary | ICD-10-CM

## 2017-10-12 HISTORY — DX: Schizophrenia, unspecified: F20.9

## 2017-10-12 HISTORY — DX: Depression, unspecified: F32.A

## 2017-10-12 HISTORY — DX: Panic disorder (episodic paroxysmal anxiety): F41.0

## 2017-10-12 HISTORY — DX: Major depressive disorder, single episode, unspecified: F32.9

## 2017-10-12 LAB — COMPREHENSIVE METABOLIC PANEL
ALBUMIN: 4.1 g/dL (ref 3.5–5.0)
ALK PHOS: 66 U/L (ref 38–126)
ALT: 16 U/L (ref 14–54)
AST: 23 U/L (ref 15–41)
Anion gap: 8 (ref 5–15)
BILIRUBIN TOTAL: 0.5 mg/dL (ref 0.3–1.2)
BUN: 8 mg/dL (ref 6–20)
CO2: 25 mmol/L (ref 22–32)
CREATININE: 0.45 mg/dL (ref 0.44–1.00)
Calcium: 8.9 mg/dL (ref 8.9–10.3)
Chloride: 105 mmol/L (ref 101–111)
GFR calc Af Amer: >60 mL/min (ref >60)
GLUCOSE: 90 mg/dL (ref 65–99)
POTASSIUM: 3.7 mmol/L (ref 3.5–5.1)
Sodium: 138 mmol/L (ref 135–145)
TOTAL PROTEIN: 6.9 g/dL (ref 6.5–8.1)

## 2017-10-12 LAB — CBC
HEMATOCRIT: 40.1 % (ref 35.0–47.0)
Hemoglobin: 13.9 g/dL (ref 12.0–16.0)
MCH: 31.3 pg (ref 26.0–34.0)
MCHC: 34.6 g/dL (ref 32.0–36.0)
MCV: 90.6 fL (ref 80.0–100.0)
Platelets: 320 10*3/uL (ref 150–440)
RBC: 4.43 MIL/uL (ref 3.80–5.20)
RDW: 13.6 % (ref 11.5–14.5)
WBC: 10.9 10*3/uL (ref 3.6–11.0)

## 2017-10-12 LAB — URINE DRUG SCREEN, QUALITATIVE (ARMC ONLY)
Amphetamines, Ur Screen: NOT DETECTED
BENZODIAZEPINE, UR SCRN: NOT DETECTED
Barbiturates, Ur Screen: NOT DETECTED
CANNABINOID 50 NG, UR ~~LOC~~: POSITIVE — AB
Cocaine Metabolite,Ur ~~LOC~~: NOT DETECTED
MDMA (ECSTASY) UR SCREEN: NOT DETECTED
Methadone Scn, Ur: NOT DETECTED
OPIATE, UR SCREEN: NOT DETECTED
PHENCYCLIDINE (PCP) UR S: NOT DETECTED
Tricyclic, Ur Screen: NOT DETECTED

## 2017-10-12 LAB — POCT PREGNANCY, URINE: Preg Test, Ur: NEGATIVE

## 2017-10-12 LAB — SALICYLATE LEVEL: Salicylate Lvl: <7 mg/dL (ref 2.8–30.0)

## 2017-10-12 LAB — ACETAMINOPHEN LEVEL: Acetaminophen (Tylenol), Serum: <10 ug/mL — ABNORMAL LOW (ref 10–30)

## 2017-10-12 LAB — ETHANOL
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
## 2017-10-12 NOTE — ED Notes (Addendum)
Report given to Dr Maricela BoSprague, [facility], over the phone.

## 2017-10-12 NOTE — ED Notes (Addendum)
SOC Completed.  

## 2017-10-13 DIAGNOSIS — F4325 Adjustment disorder with mixed disturbance of emotions and conduct: Secondary | ICD-10-CM | POA: Diagnosis not present

## 2017-10-13 DIAGNOSIS — F39 Unspecified mood [affective] disorder: Secondary | ICD-10-CM

## 2017-10-13 DIAGNOSIS — F3181 Bipolar II disorder: Secondary | ICD-10-CM

## 2017-10-13 NOTE — ED Notes (Signed)
Patient's parents requesting to speak to RN.  Pt's stepfather and mother wanting patient to be admitted.  RN paged and spoke with Dr. Chelsea Primuslapcacs. Pt to be discharged - no change in disposition.  Pt discharged to parents.

## 2017-10-13 NOTE — ED Notes (Signed)
[facility] and [facility] Pt mood is sad and affect is anxious. Pt acknowledges tx plan and is agreeable to inpatient admission, although she prefers out patient follow up. She denies SI at this time. Writer provided nutrition and 15 minute checks are ongoing for safety.

## 2017-10-13 NOTE — ED Notes (Signed)
Pt is visiting with her mother in the dayroom.  Pt is very soft spoken. Denies SI.HI and AVH.  Maintained on 15 minute checks and observation by security camera for safety.

## 2017-10-13 NOTE — ED Notes (Signed)
Pt discharged to parents. Pt denies SI/HI and AVH.  All belongings returned to patient.

## 2017-10-13 NOTE — ED Provider Notes (Signed)
Discussed with Dr. Toni Amendlapacs after his evaluation in the ED. Feels the patient is psychiatrically stable, not a danger to herself or others, will resend IVC. Patient remains medically stable with unremarkable vital signs and suitable for outpatient follow-up.    Kimberly Boyle, Kimberly Furness, MD 10/13/17 [phone]
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
## 2017-10-13 NOTE — ED Notes (Signed)
Patient resting quietly in room. No noted distress or abnormal behaviors noted. Will continue 15 minute checks and observation by security camera for safety. 

## 2017-10-13 NOTE — ED Notes (Signed)
Pt is waiting on her mother to pick her up. Maintained on 15 minute checks and observation by security camera for safety.

## 2017-10-18 ENCOUNTER — Encounter: Payer: Self-pay | Admitting: Emergency Medicine

## 2017-10-20 ENCOUNTER — Other Ambulatory Visit: Payer: Self-pay

## 2017-10-20 ENCOUNTER — Ambulatory Visit (INDEPENDENT_AMBULATORY_CARE_PROVIDER_SITE_OTHER): Payer: BLUE CROSS/BLUE SHIELD | Admitting: Psychiatry

## 2017-10-20 ENCOUNTER — Encounter: Payer: Self-pay | Admitting: Psychiatry

## 2017-10-20 VITALS — BP 123/84 | HR 76 | Temp 99.2°F | Wt 137.0 lb

## 2017-10-20 DIAGNOSIS — F411 Generalized anxiety disorder: Secondary | ICD-10-CM | POA: Diagnosis not present

## 2017-10-20 DIAGNOSIS — F1210 Cannabis abuse, uncomplicated: Secondary | ICD-10-CM | POA: Diagnosis not present

## 2017-10-20 DIAGNOSIS — F209 Schizophrenia, unspecified: Secondary | ICD-10-CM

## 2017-10-20 MED ORDER — RISPERIDONE 2 MG PO TABS: 2.0000 mg | ORAL_TABLET | Freq: Two times a day (BID) | ORAL | 1 refills | Status: DC

## 2017-10-20 MED ORDER — CARBAMAZEPINE ER 200 MG PO CP12: 200.0000 mg | ORAL_CAPSULE | Freq: Two times a day (BID) | ORAL | 1 refills | Status: DC

## 2017-10-20 MED ORDER — HYDROXYZINE HCL 25 MG PO TABS: 25.0000 mg | ORAL_TABLET | Freq: Three times a day (TID) | ORAL | 1 refills | Status: DC | PRN

## 2017-10-20 MED ORDER — BENZTROPINE MESYLATE 0.5 MG PO TABS: 0.5000 mg | ORAL_TABLET | Freq: Two times a day (BID) | ORAL | 1 refills | Status: DC

## 2017-10-20 MED ORDER — MIRTAZAPINE 7.5 MG PO TABS
7.5000 mg | ORAL_TABLET | Freq: Every day | ORAL | 1 refills | Status: DC
Start: 2017-10-20 — End: 2017-12-16
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
## 2017-10-21 ENCOUNTER — Encounter: Payer: Self-pay | Admitting: Psychiatry

## 2017-10-25 ENCOUNTER — Other Ambulatory Visit: Payer: Self-pay | Admitting: Psychiatry

## 2017-10-26 ENCOUNTER — Other Ambulatory Visit: Payer: Self-pay | Admitting: Psychiatry

## 2017-10-26 LAB — LIPID PANEL W/O CHOL/HDL RATIO
CHOLESTEROL TOTAL: 139 mg/dL (ref 100–199)
HDL: 43 mg/dL (ref >39)
LDL Calculated: 72 mg/dL (ref 0–99)
TRIGLYCERIDES: 119 mg/dL (ref 0–149)
VLDL Cholesterol Cal: 24 mg/dL (ref 5–40)

## 2017-10-26 LAB — CBC WITH DIFFERENTIAL/PLATELET
BASOS: 0 %
Basophils Absolute: 0 10*3/uL (ref 0.0–0.2)
EOS (ABSOLUTE): 0.1 10*3/uL (ref 0.0–0.4)
EOS: 2 %
HEMATOCRIT: 38.9 % (ref 34.0–46.6)
Hemoglobin: 13.4 g/dL (ref 11.1–15.9)
IMMATURE GRANULOCYTES: 0 %
Immature Grans (Abs): 0 10*3/uL (ref 0.0–0.1)
LYMPHS ABS: 1.6 10*3/uL (ref 0.7–3.1)
Lymphs: 21 %
MCH: 31.2 pg (ref 26.6–33.0)
MCHC: 34.4 g/dL (ref 31.5–35.7)
MCV: 91 fL (ref 79–97)
MONOS ABS: 0.7 10*3/uL (ref 0.1–0.9)
Monocytes: 9 %
Neutrophils Absolute: 5.2 10*3/uL (ref 1.4–7.0)
Neutrophils: 68 %
PLATELETS: 267 10*3/uL (ref 150–379)
RBC: 4.29 x10E6/uL (ref 3.77–5.28)
RDW: 13.5 % (ref 12.3–15.4)
WBC: 7.6 10*3/uL (ref 3.4–10.8)

## 2017-10-26 LAB — COMPREHENSIVE METABOLIC PANEL
ALK PHOS: 66 IU/L (ref 39–117)
ALT: 8 IU/L (ref 0–32)
AST: 13 IU/L (ref 0–40)
Albumin/Globulin Ratio: 1.8 (ref 1.2–2.2)
Albumin: 4.1 g/dL (ref 3.5–5.5)
BUN/Creatinine Ratio: 11 (ref 9–23)
BUN: 6 mg/dL (ref 6–20)
CALCIUM: 8.8 mg/dL (ref 8.7–10.2)
CHLORIDE: 102 mmol/L (ref 96–106)
CO2: 22 mmol/L (ref 20–29)
Creatinine, Ser: 0.56 mg/dL — ABNORMAL LOW (ref 0.57–1.00)
GFR calc Af Amer: 152 mL/min/{1.73_m2} (ref >59)
GFR, EST NON AFRICAN AMERICAN: 132 mL/min/{1.73_m2} (ref >59)
GLOBULIN, TOTAL: 2.3 g/dL (ref 1.5–4.5)
Glucose: 84 mg/dL (ref 65–99)
Potassium: 4.6 mmol/L (ref 3.5–5.2)
SODIUM: 143 mmol/L (ref 134–144)
Total Protein: 6.4 g/dL (ref 6.0–8.5)

## 2017-10-26 LAB — PROLACTIN: Prolactin: 132.8 ng/mL — ABNORMAL HIGH (ref 4.8–23.3)

## 2017-10-26 LAB — CARBAMAZEPINE LEVEL, TOTAL: Carbamazepine (Tegretol), S: 5.9 ug/mL (ref 4.0–12.0)

## 2017-10-26 NOTE — Telephone Encounter (Signed)
RECEIVED lab result  from [facility] - dated 10/25/2017  CBC with diff - wnl CMP - wnl Lipid panel- wnl Prolactin - 132.8 - High Carbamazepine level - 5.9 ug/ml - wnl   Patient has appointment on 11/03/2017 - will discuss .

## 2017-11-03 ENCOUNTER — Encounter: Payer: Self-pay | Admitting: Psychiatry

## 2017-11-03 ENCOUNTER — Other Ambulatory Visit: Payer: Self-pay

## 2017-11-03 ENCOUNTER — Ambulatory Visit (INDEPENDENT_AMBULATORY_CARE_PROVIDER_SITE_OTHER): Payer: BLUE CROSS/BLUE SHIELD | Admitting: Psychiatry

## 2017-11-03 VITALS — BP 110/72 | HR 75 | Temp 98.8°F | Wt 142.4 lb

## 2017-11-03 DIAGNOSIS — F200 Paranoid schizophrenia: Secondary | ICD-10-CM

## 2017-11-03 DIAGNOSIS — F411 Generalized anxiety disorder: Secondary | ICD-10-CM

## 2017-11-03 DIAGNOSIS — F17200 Nicotine dependence, unspecified, uncomplicated: Secondary | ICD-10-CM | POA: Diagnosis not present

## 2017-11-03 DIAGNOSIS — F1210 Cannabis abuse, uncomplicated: Secondary | ICD-10-CM

## 2017-11-03 MED ORDER — ARIPIPRAZOLE 5 MG PO TABS: 5.0000 mg | ORAL_TABLET | Freq: Every day | ORAL | 1 refills | Status: DC

## 2017-11-03 MED ORDER — RISPERIDONE 2 MG PO TABS: 1.0000 mg | ORAL_TABLET | Freq: Every day | ORAL | 0 refills | Status: DC
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
## 2017-11-03 NOTE — Patient Instructions (Signed)
Aripiprazole intramuscular injection What is this medicine? ARIPIPRAZOLE (ay ri PIP ray zole) is an atypical antipsychotic. It is used to treat agitation associated with schizophrenia or bipolar disorder, also known as manic-depression. This medicine may be used for other purposes; ask your health care provider or pharmacist if you have questions. COMMON BRAND NAME(S): Abilify What should I tell my health care provider before I take this medicine? They need to know if you have any of these conditions: -dehydration -dementia -diabetes -heart disease -history of stroke -low blood counts, like low white cell, platelet, or red cell counts -Parkinson's disease -seizures -suicidal thoughts, plans, or attempt; a previous suicide attempt by you or a family member -an unusual or allergic reaction to aripiprazole, other medicines, foods, dyes, or preservatives -pregnant or trying to get pregnant -breast-feeding How should I use this medicine? This medicine is for injection into a muscle. It is given by a health care professional in a hospital or clinic setting. A special MedGuide will be given to you before each treatment. Be sure to read this information carefully each time. Talk to your pediatrician regarding the use of this medicine in children. Special care may be needed. Overdosage: If you think you have taken too much of this medicine contact a poison control center or emergency room at once. NOTE: This medicine is only for you. Do not share this medicine with others. What if I miss a dose? This does not apply. What may interact with this medicine? Do not take this medicine with any of the following medications: -brexpiprazole -cisapride -dofetilide -dronedarone -metoclopramide -pimozide -thioridazine This medicine may also interact with the following medications: -alcohol -carbamazepine -certain medicines for anxiety or sleep -certain medicines for blood pressure -certain  medicines for fungal infections like ketoconazole, fluconazole, posaconazole, and itraconazole -clarithromycin -fluoxetine -other medicines that prolong the QT interval (cause an abnormal heart rhythm) -paroxetine -quinidine -rifampin This list may not describe all possible interactions. Give your health care provider a list of all the medicines, herbs, non-prescription drugs, or dietary supplements you use. Also tell them if you smoke, drink alcohol, or use illegal drugs. Some items may interact with your medicine. What should I watch for while using this medicine? Your condition will be monitored carefully while you are receiving this medicine. Tell your doctor or healthcare professional if your symptoms do not start to get better or if they get worse. You may get dizzy or drowsy. Do not drive, use machinery, or do anything that needs mental alertness until you know how this medicine affects you. Do not stand or sit up quickly, especially if you are an older patient. This reduces the risk of dizzy or fainting spells. Alcohol can increase dizziness and drowsiness. Avoid alcoholic drinks. This medicine can reduce the response of your body to heat or cold. Dress warm in cold weather and stay hydrated in hot weather. If possible, avoid extreme temperatures like saunas, hot tubs, very hot or cold showers, or activities that can cause dehydration such as vigorous exercise. This medicine may cause dry eyes and blurred vision. If you wear contact lenses you may feel some discomfort. Lubricating drops may help. See your eye doctor if the problem does not go away or is severe. If you notice an increased hunger or thirst, different from your normal hunger or thirst, or if you find that you have to urinate more frequently, you should contact your health care provider as soon as possible. You may need to have your blood sugar monitored.  This medicine may cause changes in your blood sugar levels. You should monitor  your blood sugar frequently if you have diabetes. There have been reports of uncontrollable and strong urges to gamble, binge eat, shop, and have sex while taking this medicine. If you experience any of these or other uncontrollable and strong urges while taking this medicine, you should report it to your health care provider as soon as possible. What side effects may I notice from receiving this medicine? Side effects that you should report to your doctor or health care professional as soon as possible: -allergic reactions like skin rash, itching or hives, swelling of the face, lips, or tongue -breathing problems -confusion -feeling faint or lightheaded, falls -fever or chills, sore throat -increased hunger or thirst -increased urination -joint pain -muscles pain, spasms -pain, redness, or irritation at site where injected -problems with balance, talking, walking -restlessness or need to keep moving -seizures -suicidal thoughts or other mood changes -trouble swallowing -uncontrollable and excessive urges (examples: gambling, binge eating, shopping, having sex) -uncontrollable head, mouth, neck, arm, or leg movements -unusually weak or tired Side effects that usually do not require medical attention (report to your doctor or health care professional if they continue or are bothersome): -blurred vision -constipation -headache -nausea, vomiting -trouble sleeping -weight gain This list may not describe all possible side effects. Call your doctor for medical advice about side effects. You may report side effects to FDA at 1-800-FDA-1088. Where should I keep my medicine? This does not apply. You will not be given this medicine to store at home. NOTE: This sheet is a summary. It may not cover all possible information. If you have questions about this medicine, talk to your doctor, pharmacist, or health care provider.  2018 Elsevier/Gold Standard (2016-09-13 11:41:34) Aripiprazole  tablets What is this medicine? ARIPIPRAZOLE (ay ri PIP ray zole) is an atypical antipsychotic. It is used to treat schizophrenia and bipolar disorder, also known as manic-depression. It is also used to treat Tourette's disorder and some symptoms of autism. This medicine may also be used in combination with antidepressants to treat major depressive disorder. This medicine may be used for other purposes; ask your health care provider or pharmacist if you have questions. COMMON BRAND NAME(S): Abilify What should I tell my health care provider before I take this medicine? They need to know if you have any of these conditions: -dehydration -dementia -diabetes -heart disease -history of stroke -low blood counts, like low white cell, platelet, or red cell counts -Parkinson's disease -seizures -suicidal thoughts, plans, or attempt; a previous suicide attempt by you or a family member -an unusual or allergic reaction to aripiprazole, other medicines, foods, dyes, or preservatives -pregnant or trying to get pregnant -breast-feeding How should I use this medicine? Take this medicine by mouth with a glass of water. Follow the directions on the prescription label. You can take this medicine with or without food. Take your doses at regular intervals. Do not take your medicine more often than directed. Do not stop taking except on the advice of your doctor or health care professional. A special MedGuide will be given to you by the pharmacist with each prescription and refill. Be sure to read this information carefully each time. Talk to your pediatrician regarding the use of this medicine in children. While this drug may be prescribed for children as young as 59 years of age for selected conditions, precautions do apply. Overdosage: If you think you have taken too much of this medicine  contact a poison control center or emergency room at once. NOTE: This medicine is only for you. Do not share this medicine  with others. What if I miss a dose? If you miss a dose, take it as soon as you can. If it is almost time for your next dose, take only that dose. Do not take double or extra doses. What may interact with this medicine? Do not take this medicine with any of the following medications: -brexpiprazole -cisapride -dofetilide -dronedarone -metoclopramide -pimozide -thioridazine This medicine may also interact with the following medications: -alcohol -carbamazepine -certain medicines for anxiety or sleep -certain medicines for blood pressure -certain medicines for fungal infections like ketoconazole, fluconazole, posaconazole, and itraconazole -clarithromycin -fluoxetine -other medicines that prolong the QT interval (cause an abnormal heart rhythm) -paroxetine -quinidine -rifampin This list may not describe all possible interactions. Give your health care provider a list of all the medicines, herbs, non-prescription drugs, or dietary supplements you use. Also tell them if you smoke, drink alcohol, or use illegal drugs. Some items may interact with your medicine. What should I watch for while using this medicine? Visit your doctor or health care professional for regular checks on your progress. It may be several weeks before you see the full effects of this medicine. Do not suddenly stop taking this medicine. You may need to gradually reduce the dose. Patients and their families should watch out for worsening depression or thoughts of suicide. Also watch out for sudden changes in feelings such as feeling anxious, agitated, panicky, irritable, hostile, aggressive, impulsive, severely restless, overly excited and hyperactive, or not being able to sleep. If this happens, especially at the beginning of antidepressant treatment or after a change in dose, call your health care professional. Bonita Quin may get dizzy or drowsy. Do not drive, use machinery, or do anything that needs mental alertness until you  know how this medicine affects you. Do not stand or sit up quickly, especially if you are an older patient. This reduces the risk of dizzy or fainting spells. Alcohol can increase dizziness and drowsiness. Avoid alcoholic drinks. This medicine can reduce the response of your body to heat or cold. Dress warm in cold weather and stay hydrated in hot weather. If possible, avoid extreme temperatures like saunas, hot tubs, very hot or cold showers, or activities that can cause dehydration such as vigorous exercise. This medicine may cause dry eyes and blurred vision. If you wear contact lenses you may feel some discomfort. Lubricating drops may help. See your eye doctor if the problem does not go away or is severe. If you notice an increased hunger or thirst, different from your normal hunger or thirst, or if you find that you have to urinate more frequently, you should contact your health care provider as soon as possible. You may need to have your blood sugar monitored. This medicine may cause changes in your blood sugar levels. You should monitor you blood sugar frequently if you have diabetes. There have been reports of uncontrollable and strong urges to gamble, binge eat, shop, and have sex while taking this medicine. If you experience any of these or other uncontrollable and strong urges while taking this medicine, you should report it to your health care provider as soon as possible. What side effects may I notice from receiving this medicine? Side effects that you should report to your doctor or health care professional as soon as possible: -allergic reactions like skin rash, itching or hives, swelling of the face, lips,  or tongue -breathing problems -confusion -feeling faint or lightheaded, falls -fever or chills, sore throat -increased hunger or thirst -increased urination -joint pain -muscles pain, spasms -problems with balance, talking, walking -restlessness or need to keep  moving -seizures -suicidal thoughts or other mood changes -trouble swallowing -uncontrollable and excessive urges (examples: gambling, binge eating, shopping, having sex) -uncontrollable head, mouth, neck, arm, or leg movements -unusually weak or tired Side effects that usually do not require medical attention (report to your doctor or health care professional if they continue or are bothersome): -blurred vision -constipation -headache -nausea, vomiting -trouble sleeping -weight gain This list may not describe all possible side effects. Call your doctor for medical advice about side effects. You may report side effects to FDA at 1-800-FDA-1088. Where should I keep my medicine? Keep out of the reach of children. Store at room temperature between 15 and 30 degrees C (59 and 86 degrees F). Throw away any unused medicine after the expiration date. NOTE: This sheet is a summary. It may not cover all possible information. If you have questions about this medicine, talk to your doctor, pharmacist, or health care provider.  2018 Elsevier/Gold Standard (2016-09-13 11:45:05)

## 2017-11-23 ENCOUNTER — Ambulatory Visit: Payer: BLUE CROSS/BLUE SHIELD | Admitting: Licensed Clinical Social Worker

## 2017-11-23 ENCOUNTER — Ambulatory Visit: Payer: BLUE CROSS/BLUE SHIELD | Admitting: Psychiatry

## 2017-11-27 DIAGNOSIS — F250 Schizoaffective disorder, bipolar type: Secondary | ICD-10-CM | POA: Insufficient documentation

## 2017-12-14 ENCOUNTER — Ambulatory Visit: Payer: BLUE CROSS/BLUE SHIELD | Admitting: Psychiatry

## 2017-12-16 ENCOUNTER — Encounter: Payer: Self-pay | Admitting: Psychiatry

## 2017-12-16 ENCOUNTER — Ambulatory Visit (INDEPENDENT_AMBULATORY_CARE_PROVIDER_SITE_OTHER): Payer: BLUE CROSS/BLUE SHIELD | Admitting: Psychiatry

## 2017-12-16 ENCOUNTER — Other Ambulatory Visit: Payer: Self-pay

## 2017-12-16 ENCOUNTER — Ambulatory Visit: Payer: Self-pay

## 2017-12-16 VITALS — BP 130/87 | HR 92 | Temp 98.8°F | Wt 129.6 lb

## 2017-12-16 DIAGNOSIS — F411 Generalized anxiety disorder: Secondary | ICD-10-CM

## 2017-12-16 DIAGNOSIS — F250 Schizoaffective disorder, bipolar type: Secondary | ICD-10-CM | POA: Diagnosis not present

## 2017-12-16 DIAGNOSIS — F17200 Nicotine dependence, unspecified, uncomplicated: Secondary | ICD-10-CM | POA: Diagnosis not present

## 2017-12-16 DIAGNOSIS — F1210 Cannabis abuse, uncomplicated: Secondary | ICD-10-CM | POA: Diagnosis not present

## 2017-12-16 MED ORDER — LITHIUM CARBONATE ER 450 MG PO TBCR: 450.0000 mg | EXTENDED_RELEASE_TABLET | Freq: Two times a day (BID) | ORAL | 1 refills | Status: DC

## 2017-12-16 MED ORDER — RISPERIDONE MICROSPHERES 25 MG IM SUSR: 25.0000 mg | INTRAMUSCULAR | 1 refills | Status: DC

## 2017-12-16 MED ORDER — RISPERIDONE 2 MG PO TABS: 1.0000 mg | ORAL_TABLET | Freq: Every day | ORAL | 1 refills | Status: DC

## 2017-12-16 MED ORDER — MIRTAZAPINE 7.5 MG PO TABS: 7.5000 mg | ORAL_TABLET | Freq: Every day | ORAL | 1 refills | Status: DC

## 2017-12-16 MED ORDER — HYDROXYZINE HCL 25 MG PO TABS: 25.0000 mg | ORAL_TABLET | Freq: Three times a day (TID) | ORAL | 1 refills | Status: DC | PRN
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
## 2017-12-17 ENCOUNTER — Encounter: Payer: Self-pay | Admitting: Psychiatry

## 2017-12-20 ENCOUNTER — Telehealth: Payer: Self-pay

## 2017-12-20 ENCOUNTER — Ambulatory Visit: Payer: Self-pay

## 2017-12-20 NOTE — Telephone Encounter (Signed)
pt mother called left a message on voicemail that Kimberly Boyle is not allow back in there neighborhood and so she been staying at a hotel.  this am her stuff was on the front pourch and she was standing at the end of the road.  she is out of it. not taking medications.  Mom states she needs her place in a facility that is going to keep her.  She needs to be in the hospital. States if she takes her up her they just release her.   She needs help.   Mother crying upset.

## 2017-12-20 NOTE — Telephone Encounter (Signed)
Returned called to mother - she states her daughter is hearing voices and standing at the end of the road this AM , could not stay at the motel due to her psychosis. Advised her to call 911 or take out IVC petition on her at the magistrate's office if she is having an acute psychotic episode at this time.  Pt as well as mother was advised to come back to clinic after her last visit for an LAI Risperdal consta on 12/16/2017. Mother states pharmacy did not have it. Mother seems to believe that LAI is not going to solve  her problems either. Pt with medication noncompliance , several IP admissions , will need LAI as well as community services in place.

## 2017-12-22 ENCOUNTER — Telehealth: Payer: Self-pay

## 2017-12-22 ENCOUNTER — Telehealth: Payer: Self-pay | Admitting: Psychiatry

## 2017-12-22 NOTE — Telephone Encounter (Signed)
Lea gave me a message that a Dolores from [facility]otherap[facility]etic wanted to speak with you about this pt.

## 2017-12-22 NOTE — Telephone Encounter (Signed)
Returned call to [employer].  As per her they are not taking any patients for IPRS funding right now and hence will not be  able to offer services to Evie   at this time.

## 2017-12-22 NOTE — Telephone Encounter (Signed)
Can I have her number pls and I can call her today

## 2017-12-22 NOTE — Telephone Encounter (Signed)
[phone]

## 2017-12-23 ENCOUNTER — Ambulatory Visit: Payer: BLUE CROSS/BLUE SHIELD | Admitting: Psychiatry

## 2017-12-30 ENCOUNTER — Ambulatory Visit: Payer: BLUE CROSS/BLUE SHIELD | Admitting: Licensed Clinical Social Worker

## 2018-02-09 ENCOUNTER — Encounter: Payer: Self-pay | Admitting: Psychiatry

## 2018-02-09 ENCOUNTER — Ambulatory Visit (INDEPENDENT_AMBULATORY_CARE_PROVIDER_SITE_OTHER): Payer: BLUE CROSS/BLUE SHIELD | Admitting: Psychiatry

## 2018-02-09 ENCOUNTER — Other Ambulatory Visit: Payer: Self-pay

## 2018-02-09 VITALS — BP 115/80 | HR 94 | Temp 99.3°F | Wt 119.4 lb

## 2018-02-09 DIAGNOSIS — F250 Schizoaffective disorder, bipolar type: Secondary | ICD-10-CM

## 2018-02-09 DIAGNOSIS — F17200 Nicotine dependence, unspecified, uncomplicated: Secondary | ICD-10-CM

## 2018-02-09 DIAGNOSIS — F1210 Cannabis abuse, uncomplicated: Secondary | ICD-10-CM

## 2018-02-09 DIAGNOSIS — F411 Generalized anxiety disorder: Secondary | ICD-10-CM

## 2018-02-09 NOTE — Patient Instructions (Addendum)
I am unable to make medication changes since I have not received any discharge recommendation records from your most recent admission. Please sign a release to obtain medical records and return in 1 week. Please also get Lithium level done in the morning prior to morning dose of Lithium.

## 2018-02-09 NOTE — Progress Notes (Signed)
BH MD OP Progress Note  02/09/2018 3:28 PM Kimberly Boyle  MRN:  [id number]  Chief Complaint: ' I am here for follow up." Chief Complaint    Follow-up; Medication Refill     HPI: Kimberly Boyle is a 22 year old Caucasian female, unemployed, single, lives in [place], has a history of schizoaffective disorder, cannabis use disorder, anxiety disorder, tobacco use disorder, presented to the clinic today for a follow-up visit.  Patient reports she was recently discharged from [facility] .  Patient reports she went to a party and used some illicit drugs during that time and ended up in the ED. Patient reports her medications were changed during her most recent hospital stay.   Patient  cannot recollect all the medications that she is on.  She does remember that she is taking lithium and that she recently got the Risperdal Consta injection.  Patient however does not know what is the dose and when she is due for the injection.  It is likely that patient continues to be noncompliant with her medication as well as outpatient appointments.  Advised patient to sign a release so that we can obtain medical records from her most recent hospital stay.  Advised patient to return to clinic in 1 week from now.  Discussed with patient to get lithium levels done and provided her with lab slip to do so.  Patient today appears to be alert and oriented.  She denies any perceptual disturbances.  She reports her mood symptoms is okay.  She denies any suicidality or homicidality.  She reports she is trying to get a job and is in the process of doing so.  She reports she is very happy about the same.  She reports she continues to stay with her parents.  She continues to have a rocky relationship with her parents.  She denies abusing any drugs or alcohol.  She reports the last time she used was a one-time use when she was at a party.    Visit Diagnosis:    ICD-10-CM   1. Schizoaffective disorder, bipolar type (HCC) F25.0    2. Tobacco use disorder F17.200   3. GAD (generalized anxiety disorder) F41.1   4. Cannabis use disorder, mild, abuse F12.10     Past Psychiatric History: I have reviewed past psychiatric history from my progress note on 12/16/2017.  Patient reports she was recently discharged from [facility].  Past Medical History:  Past Medical History:  Diagnosis Date  . Anxiety   . Arthritis   . Bipolar disorder (HCC)   . Depression   . Major depressive disorder   . Panic disorder   . Schizophrenia (HCC)    History reviewed. No pertinent surgical history.  Family Psychiatric History: I have reviewed family psychiatric history from my progress note on 12/16/2017.  Family History:  Family History  Problem Relation Age of Onset  . Post-traumatic stress disorder Mother   . Depression Mother   . Depression Maternal Uncle   . Cancer Maternal Grandmother    Substance abuse history: Denies at this now.  However patient was seen in the emergency department end of March 2019 and at that time was positive for cannabis.  Patient however reports she stopped using cannabis.  Per ED notes she was also seen for methamphetamine abuse.  Social History: Patient reports she currently lives with her parents in [place].  She has 2 siblings.  I have reviewed social history from my progress note on 12/16/2017.  Social History   Socioeconomic History  . Marital status: Single    Spouse name: Not on file  . Number of children: 0  . Years of education: Not on file  . Highest education level: High school graduate  Occupational History    Comment: unemployed  Social Needs  . Financial resource strain: Not on file  . Food insecurity:    Worry: Not on file    Inability: Not on file  . Transportation needs:    Medical: No    Non-medical: No  Tobacco Use  . Smoking status: Current Every Day Smoker    Packs/day: 0.25    Types: Cigarettes  . Smokeless tobacco: Never Used  Substance and Sexual  Activity  . Alcohol use: No    Frequency: Never  . Drug use: No    Types: Marijuana  . Sexual activity: Not Currently  Lifestyle  . Physical activity:    Days per week: 0 days    Minutes per session: 0 min  . Stress: To some extent  Relationships  . Social connections:    Talks on phone: Not on file    Gets together: Not on file    Attends religious service: Never    Active member of club or organization: No    Attends meetings of clubs or organizations: Never    Relationship status: Never married  Other Topics Concern  . Not on file  Social History Narrative   ** Merged History Encounter **        Allergies:  Allergies  Allergen Reactions  . Codeine   . Penicillins Nausea Only    Has patient had a PCN reaction causing immediate rash, facial/tongue/throat swelling, SOB or lightheadedness with hypotension:  no Has patient had a PCN reaction causing severe rash involving mucus membranes or skin necrosis:  no Has patient had a PCN reaction that required hospitalization: no Has patient had a PCN reaction occurring within the last 10 years: no If all of the above answers are "NO", then may proceed with Cephalosporin use.     Metabolic Disorder Labs: No results found for: HGBA1C, MPG Lab Results  Component Value Date   PROLACTIN 132.8 (H) 10/25/2017   Lab Results  Component Value Date   CHOL 139 10/25/2017   TRIG 119 10/25/2017   HDL 43 10/25/2017   LDLCALC 72 10/25/2017   No results found for: TSH  Therapeutic Level Labs: No results found for: LITHIUM No results found for: VALPROATE No components found for:  CBMZ  Current Medications: Current Outpatient Medications  Medication Sig Dispense Refill  . benztropine (COGENTIN) 0.5 MG tablet Take 1 tablet (0.5 mg total) by mouth 2 (two) times daily. 60 tablet 1  . hydrOXYzine (ATARAX/VISTARIL) 25 MG tablet Take 1 tablet (25 mg total) by mouth every 8 (eight) hours as needed for anxiety. 90 tablet 1  . lithium  carbonate (ESKALITH) 450 MG CR tablet Take 1 tablet (450 mg total) by mouth 2 (two) times daily. 60 tablet 1  . risperiDONE (RISPERDAL) 2 MG tablet Take 0.5 tablets (1 mg total) by mouth daily. 15 tablet 1  . risperiDONE microspheres (RISPERDAL CONSTA) 25 MG injection Inject 2 mLs (25 mg total) into the muscle every 14 (fourteen) days. First dose 12/17/2017 1 each 1   No current facility-administered medications for this visit.      Musculoskeletal: Strength & Muscle Tone: within normal limits Gait & Station: normal Patient leans: N/A  Psychiatric Specialty Exam: Review of Systems  Psychiatric/Behavioral: Negative for depression. The patient is not nervous/anxious.   All other systems reviewed and are negative.   Blood pressure 115/80, pulse 94, temperature 99.3 F (37.4 C), temperature source Oral, weight 119 lb 6.4 oz (54.2 kg), last menstrual period 01/10/2018.Body mass index is 21.15 kg/m.  General Appearance: Casual  Eye Contact:  Fair  Speech:  Normal Rate  Volume:  Normal  Mood:  Euthymic  Affect:  Congruent  Thought Process:  Goal Directed and Descriptions of Associations: Intact  Orientation:  Full (Time, Place, and Person)  Thought Content: Logical   Suicidal Thoughts:  No  Homicidal Thoughts:  No  Memory:  Immediate;   Fair Recent;   Fair Remote;   Fair  Judgement:  Fair  Insight:  Fair  Psychomotor Activity:  Normal  Concentration:  Concentration: Fair and Attention Span: Fair  Recall:  [employer] of Knowledge: Fair  Language: Fair  Akathisia:  No  Handed:  Right  AIMS (if indicated): denies stiffness, rigidity  Assets:  Communication Skills Desire for Improvement Housing Social Support  ADL's:  Intact  Cognition: WNL  Sleep:  Fair   Screenings:   Assessment and Plan: Daryana is a 22 year old Caucasian female who has a history of schizophrenia, anxiety disorder, cannabis abuse, presented to the clinic today for a follow-up visit.  Patient reports she  was recently discharged from [facility].  However does not know what medications she was discharged on.  She reports she has been compliant.  She also reports she was recently started on Risperdal Consta however she does not know when she is due for her next injection.  It is likely that patient may be noncompliant with medication.  Patient may need more community support system like PSR/ACT . Will discuss this with patient as well as Child psychotherapist here in clinic.  Plan as noted below.  Plan Schizoaffective disorder Patient was recently discharged from [facility] however patient unable to give information about what medication she is on.  Neither did she bring in any discharge instructions or medication list with her.  Discussed with patient to sign a release so that we can obtain medical records.  Discussed with patient that I am not changing any medications for her today and to continue the same medications as prescribed by her recent hospital stay.  We will provide lab slip to get lithium level done.  Advised patient to get lithium level in the morning prior to a.m. dose of lithium.  Will discuss patient's referral for more community services with Ms.Nolon Rod due to her multiple inpatient hospital admissions as well as noncompliance with medications and outpatient appointments.    Follow-up in clinic in 1 week or sooner if needed.    More than 50 % of the time was spent for psychoeducation and supportive psychotherapy and care coordination.  This note was generated in part or whole with voice recognition software. Voice recognition is usually quite accurate but there are transcription errors that can and very often do occur. I apologize for any typographical errors that were not detected and corrected.         Jomarie Longs, MD 02/09/2018, 3:28 PM

## 2018-02-16 ENCOUNTER — Ambulatory Visit (INDEPENDENT_AMBULATORY_CARE_PROVIDER_SITE_OTHER): Payer: BLUE CROSS/BLUE SHIELD | Admitting: Psychiatry

## 2018-02-16 ENCOUNTER — Encounter: Payer: Self-pay | Admitting: Psychiatry

## 2018-02-16 ENCOUNTER — Other Ambulatory Visit: Payer: Self-pay

## 2018-02-16 VITALS — BP 112/79 | HR 86 | Temp 98.0°F | Wt 138.2 lb

## 2018-02-16 DIAGNOSIS — F250 Schizoaffective disorder, bipolar type: Secondary | ICD-10-CM

## 2018-02-16 DIAGNOSIS — F1210 Cannabis abuse, uncomplicated: Secondary | ICD-10-CM

## 2018-02-16 DIAGNOSIS — F17200 Nicotine dependence, unspecified, uncomplicated: Secondary | ICD-10-CM

## 2018-02-16 DIAGNOSIS — F411 Generalized anxiety disorder: Secondary | ICD-10-CM | POA: Diagnosis not present

## 2018-02-16 NOTE — Progress Notes (Signed)
BH MD OP Progress Note  02/16/2018 4:07 PM Kimberly Boyle  MRN:  [id number]  Chief Complaint: 'I am here for follow up." Chief Complaint    Follow-up; Medication Refill     HPI: Kimberly Boyle is a 22 year old Caucasian female, unemployed, single, lives in [place], has a history of schizoaffective disorder, cannabis use disorder, anxiety  disorder, tobacco use disorder, presented to the clinic today for a follow-up visit. Pt was advised to return to clinic in a week during her last visit. She was advised to bring medication list as well as discharge instruction from her most recent hospital stay.  Patient was also advised to get lithium level done in order to help writer make medication readjustment and also to monitor her levels .  Patient today presents without any discharge instructions or summaries.  Patient also was unable to get her lithium levels done.  Patient appears to be passive although pleasant.  She reports she does not remember the dosages of her medications with which she were discharged.  Patient however claims that she is compliant with her medications as prescribed.  Patient also reports she was discharged on Risperdal Consta injection however she reports she does not know when she is due for her next one.  Discussed with patient that she needs to get her medication list and discharge instructions to writer.  Discussed with patient to sign another release to obtain medical records from her most recent inpatient hospital stay.  Discussed with patient to get her lithium level done again.  Visit Diagnosis:    ICD-10-CM   1. Schizoaffective disorder, bipolar type (HCC) F25.0   2. Tobacco use disorder F17.200   3. GAD (generalized anxiety disorder) F41.1   4. Cannabis use disorder, mild, abuse F12.10     Past Psychiatric History: Reviewed past psychiatric history from my progress note on 12/16/2017.  Past Medical History:  Past Medical History:  Diagnosis Date  . Anxiety   . Arthritis    . Bipolar disorder (HCC)   . Depression   . Major depressive disorder   . Panic disorder   . Schizophrenia (HCC)    History reviewed. No pertinent surgical history.  Family Psychiatric History: Have reviewed family psychiatric history from my progress note on 12/16/2017  Family History:  Family History  Problem Relation Age of Onset  . Post-traumatic stress disorder Mother   . Depression Mother   . Depression Maternal Uncle   . Cancer Maternal Grandmother   Substance abuse history: Denies at this time.  Patient however was seen in the emergency department end of March 2019 at that time was positive for cannabis.  Patient however reports she stopped using cannabis.  Per ED notes she was also seen for methamphetamine abuse.   Social History: Reviewed social history from my progress note on 12/16/2017 Social History   Socioeconomic History  . Marital status: Single    Spouse name: Not on file  . Number of children: 0  . Years of education: Not on file  . Highest education level: High school graduate  Occupational History    Comment: unemployed  Social Needs  . Financial resource strain: Not on file  . Food insecurity:    Worry: Not on file    Inability: Not on file  . Transportation needs:    Medical: No    Non-medical: No  Tobacco Use  . Smoking status: Current Every Day Smoker    Packs/day: 0.25    Types: Cigarettes  .  Smokeless tobacco: Never Used  Substance and Sexual Activity  . Alcohol use: No    Frequency: Never  . Drug use: No    Types: Marijuana  . Sexual activity: Not Currently  Lifestyle  . Physical activity:    Days per week: 0 days    Minutes per session: 0 min  . Stress: To some extent  Relationships  . Social connections:    Talks on phone: Not on file    Gets together: Not on file    Attends religious service: Never    Active member of club or organization: No    Attends meetings of clubs or organizations: Never    Relationship status: Never  married  Other Topics Concern  . Not on file  Social History Narrative   ** Merged History Encounter **        Allergies:  Allergies  Allergen Reactions  . Codeine   . Penicillins Nausea Only    Has patient had a PCN reaction causing immediate rash, facial/tongue/throat swelling, SOB or lightheadedness with hypotension:  no Has patient had a PCN reaction causing severe rash involving mucus membranes or skin necrosis:  no Has patient had a PCN reaction that required hospitalization: no Has patient had a PCN reaction occurring within the last 10 years: no If all of the above answers are "NO", then may proceed with Cephalosporin use.     Metabolic Disorder Labs: No results found for: HGBA1C, MPG Lab Results  Component Value Date   PROLACTIN 132.8 (H) 10/25/2017   Lab Results  Component Value Date   CHOL 139 10/25/2017   TRIG 119 10/25/2017   HDL 43 10/25/2017   LDLCALC 72 10/25/2017   No results found for: TSH  Therapeutic Level Labs: No results found for: LITHIUM No results found for: VALPROATE No components found for:  CBMZ  Current Medications: Current Outpatient Medications  Medication Sig Dispense Refill  . benztropine (COGENTIN) 0.5 MG tablet Take 1 tablet (0.5 mg total) by mouth 2 (two) times daily. 60 tablet 1  . hydrOXYzine (ATARAX/VISTARIL) 25 MG tablet Take 1 tablet (25 mg total) by mouth every 8 (eight) hours as needed for anxiety. 90 tablet 1  . lithium carbonate (ESKALITH) 450 MG CR tablet Take 1 tablet (450 mg total) by mouth 2 (two) times daily. 60 tablet 1  . risperiDONE (RISPERDAL) 2 MG tablet Take 0.5 tablets (1 mg total) by mouth daily. 15 tablet 1  . risperiDONE microspheres (RISPERDAL CONSTA) 25 MG injection Inject 2 mLs (25 mg total) into the muscle every 14 (fourteen) days. First dose 12/17/2017 1 each 1  . VIENVA 0.1-20 MG-MCG tablet TK 1 T PO DAILY  3   No current facility-administered medications for this visit.       Musculoskeletal: Strength & Muscle Tone: within normal limits Gait & Station: normal Patient leans: N/A  Psychiatric Specialty Exam: Review of Systems  Psychiatric/Behavioral: The patient has insomnia.   All other systems reviewed and are negative.   Blood pressure 112/79, pulse 86, temperature 98 F (36.7 C), temperature source Oral, weight 138 lb 3.2 oz (62.7 kg), last menstrual period 01/17/2018.Body mass index is 24.48 kg/m.  General Appearance: Casual  Eye Contact:  Fair  Speech:  Clear and Coherent  Volume:  Normal  Mood:  Euthymic  Affect:  Congruent  Thought Process:  Goal Directed and Descriptions of Associations: Intact  Orientation:  Full (Time, Place, and Person)  Thought Content: Logical   Suicidal Thoughts:  No  Homicidal Thoughts:  No  Memory:  Immediate;   Fair Recent;   Fair Remote;   Fair  Judgement:  Fair  Insight:  Fair  Psychomotor Activity:  Normal  Concentration:  Concentration: Fair and Attention Span: Fair  Recall:  [employer] of Knowledge: Fair  Language: Fair  Akathisia:  No  Handed:  Right  AIMS (if indicated): denies rigidity, stiffness  Assets:  Communication Skills Desire for Improvement  ADL's:  Intact  Cognition: WNL  Sleep:  restless   Screenings:   Assessment and Plan:Kimberly Boyle is a 22 year old Caucasian female who has a history of schizophrenia, anxiety disorder, cannabis abuse, presented to the clinic today for a follow-up visit.  Patient today presents without any discharge instructions or medication list.  Patient also was unable to get her lithium level as requested last visit.  She is likely noncompliant with her medications.  Discussed with patient to get her lithium level as well as return to clinic with her medication list.  Discussed with patient that she will be terminated from the clinic if she is not following recommendations.  Plan Schizoaffective disorder No medication changes were made today.  Patient was  recently discharged from inpatient mental health admissions, however she was unable to bring in any discharge instructions or medication list with her as requested during her last visit with Clinical research associate.  She was also provided with lab slip to get lithium level done last visit however patient did not follow recommendations.  Patient also has been referred to Ms. Nolon Rod for referral for community support team or act team.  Follow-up in clinic in 1 week or sooner if needed.  More than 50 % of the time was spent for psychoeducation and supportive psychotherapy and care coordination.  This note was generated in part or whole with voice recognition software. Voice recognition is usually quite accurate but there are transcription errors that can and very often do occur. I apologize for any typographical errors that were not detected and corrected.       Jomarie Longs, MD 02/17/2018, 9:44 AM

## 2018-02-16 NOTE — Patient Instructions (Signed)
Please bring in the medication list as well as discharge instructions when you come in next week.  Please bring your lab report for Lithium level with you when you come in next week.  Please sign a release at front desk to obtain medical records from you most recent hospital stay.

## 2018-02-17 ENCOUNTER — Encounter: Payer: Self-pay | Admitting: Psychiatry

## 2018-02-23 ENCOUNTER — Ambulatory Visit: Payer: BLUE CROSS/BLUE SHIELD | Admitting: Psychiatry

## 2018-05-01 ENCOUNTER — Emergency Department
Admission: EM | Admit: 2018-05-01 | Discharge: 2018-05-01 | Disposition: A | Discharge status: Home / Self Care | Payer: BLUE CROSS/BLUE SHIELD | Attending: Student in an Organized Health Care Education/Training Program | Admitting: Student in an Organized Health Care Education/Training Program

## 2018-05-01 ENCOUNTER — Other Ambulatory Visit: Payer: Self-pay

## 2018-05-01 DIAGNOSIS — R5383 Other fatigue: Secondary | ICD-10-CM | POA: Insufficient documentation

## 2018-05-01 DIAGNOSIS — F17210 Nicotine dependence, cigarettes, uncomplicated: Secondary | ICD-10-CM | POA: Diagnosis not present

## 2018-05-01 DIAGNOSIS — R55 Syncope and collapse: Secondary | ICD-10-CM | POA: Diagnosis present

## 2018-05-01 LAB — BASIC METABOLIC PANEL
ANION GAP: 6 (ref 5–15)
BUN: 8 mg/dL (ref 6–20)
CALCIUM: 8.7 mg/dL — AB (ref 8.9–10.3)
CO2: 25 mmol/L (ref 22–32)
Chloride: 108 mmol/L (ref 98–111)
Creatinine, Ser: 0.51 mg/dL (ref 0.44–1.00)
Glucose, Bld: 107 mg/dL — ABNORMAL HIGH (ref 70–99)
Potassium: 4 mmol/L (ref 3.5–5.1)
Sodium: 139 mmol/L (ref 135–145)

## 2018-05-01 LAB — CBC WITH DIFFERENTIAL/PLATELET
BASOS ABS: 0 10*3/uL (ref 0–0.1)
BASOS PCT: 0 %
EOS PCT: 1 %
Eosinophils Absolute: 0.1 10*3/uL (ref 0–0.7)
HEMATOCRIT: 36.8 % (ref 35.0–47.0)
Hemoglobin: 12.7 g/dL (ref 12.0–16.0)
Lymphocytes Relative: 11 %
Lymphs Abs: 1.3 10*3/uL (ref 1.0–3.6)
MCH: 32.1 pg (ref 26.0–34.0)
MCHC: 34.6 g/dL (ref 32.0–36.0)
MCV: 92.8 fL (ref 80.0–100.0)
MONO ABS: 0.7 10*3/uL (ref 0.2–0.9)
Monocytes Relative: 6 %
NEUTROS ABS: 9.8 10*3/uL — AB (ref 1.4–6.5)
Neutrophils Relative %: 82 %
PLATELETS: 300 10*3/uL (ref 150–440)
RBC: 3.96 MIL/uL (ref 3.80–5.20)
RDW: 13.1 % (ref 11.5–14.5)
WBC: 12 10*3/uL — ABNORMAL HIGH (ref 3.6–11.0)

## 2018-05-01 LAB — POCT PREGNANCY, URINE: PREG TEST UR: NEGATIVE

## 2018-05-01 LAB — HCG, QUANTITATIVE, PREGNANCY: hCG, Beta Chain, Quant, S: <1 m[IU]/mL (ref <5)

## 2018-05-01 MED ORDER — SODIUM CHLORIDE 0.9 % IV BOLUS
1000.0000 mL | Freq: Once | INTRAVENOUS | Status: AC
  Administered 2018-05-01: 1000 mL via INTRAVENOUS

## 2018-05-01 NOTE — ED Notes (Signed)
Pt changed her mind and stated that she DOES want the fluids now. This RN started fluids.
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
## 2018-05-01 NOTE — ED Notes (Signed)
Pt refused fluids.

## 2018-05-01 NOTE — ED Notes (Signed)
Pt given food tray.

## 2018-05-01 NOTE — ED Notes (Signed)
Recently released from psych and was placed on flexeril 3x daily. States it makes her feel "messed up". Advised she should not be driving on muscle relaxers or working with muscle relaxers in her system.

## 2018-05-01 NOTE — ED Triage Notes (Signed)
Pt arrived at work and states before she could go in she felt like the blood rushed to her head and felt dizzy so sat down.

## 2018-05-05 ENCOUNTER — Emergency Department
Admission: EM | Admit: 2018-05-05 | Discharge: 2018-05-06 | Disposition: A | Discharge status: Other Acute Inpatient Hospital | Payer: BLUE CROSS/BLUE SHIELD | Attending: Emergency Medicine | Admitting: Emergency Medicine

## 2018-05-05 DIAGNOSIS — F1220 Cannabis dependence, uncomplicated: Secondary | ICD-10-CM | POA: Diagnosis present

## 2018-05-05 DIAGNOSIS — F200 Paranoid schizophrenia: Secondary | ICD-10-CM | POA: Insufficient documentation

## 2018-05-05 DIAGNOSIS — F17210 Nicotine dependence, cigarettes, uncomplicated: Secondary | ICD-10-CM | POA: Diagnosis not present

## 2018-05-05 DIAGNOSIS — F315 Bipolar disorder, current episode depressed, severe, with psychotic features: Secondary | ICD-10-CM | POA: Diagnosis not present

## 2018-05-05 DIAGNOSIS — F29 Unspecified psychosis not due to a substance or known physiological condition: Secondary | ICD-10-CM | POA: Diagnosis present

## 2018-05-05 DIAGNOSIS — Z79899 Other long term (current) drug therapy: Secondary | ICD-10-CM | POA: Diagnosis not present

## 2018-05-05 DIAGNOSIS — F250 Schizoaffective disorder, bipolar type: Secondary | ICD-10-CM

## 2018-05-05 LAB — URINE DRUG SCREEN, QUALITATIVE (ARMC ONLY)
AMPHETAMINES, UR SCREEN: NOT DETECTED
Barbiturates, Ur Screen: NOT DETECTED
Benzodiazepine, Ur Scrn: NOT DETECTED
Cannabinoid 50 Ng, Ur ~~LOC~~: POSITIVE — AB
Cocaine Metabolite,Ur ~~LOC~~: NOT DETECTED
MDMA (ECSTASY) UR SCREEN: NOT DETECTED
Methadone Scn, Ur: NOT DETECTED
OPIATE, UR SCREEN: NOT DETECTED
PHENCYCLIDINE (PCP) UR S: NOT DETECTED
Tricyclic, Ur Screen: NOT DETECTED

## 2018-05-05 LAB — COMPREHENSIVE METABOLIC PANEL
ALT: 11 U/L (ref 0–44)
AST: 18 U/L (ref 15–41)
Albumin: 4.2 g/dL (ref 3.5–5.0)
Alkaline Phosphatase: 53 U/L (ref 38–126)
Anion gap: 8 (ref 5–15)
BILIRUBIN TOTAL: 0.4 mg/dL (ref 0.3–1.2)
BUN: 7 mg/dL (ref 6–20)
CHLORIDE: 105 mmol/L (ref 98–111)
CO2: 25 mmol/L (ref 22–32)
Calcium: 8.9 mg/dL (ref 8.9–10.3)
Creatinine, Ser: 0.56 mg/dL (ref 0.44–1.00)
GFR calc Af Amer: >60 mL/min (ref >60)
GFR calc non Af Amer: >60 mL/min (ref >60)
GLUCOSE: 99 mg/dL (ref 70–99)
POTASSIUM: 3.6 mmol/L (ref 3.5–5.1)
Sodium: 138 mmol/L (ref 135–145)
TOTAL PROTEIN: 7.7 g/dL (ref 6.5–8.1)

## 2018-05-05 LAB — CBC
HEMATOCRIT: 40.1 % (ref 35.0–47.0)
Hemoglobin: 14 g/dL (ref 12.0–16.0)
MCH: 32.3 pg (ref 26.0–34.0)
MCHC: 34.9 g/dL (ref 32.0–36.0)
MCV: 92.6 fL (ref 80.0–100.0)
Platelets: 383 10*3/uL (ref 150–440)
RBC: 4.34 MIL/uL (ref 3.80–5.20)
RDW: 12.6 % (ref 11.5–14.5)
WBC: 8.9 10*3/uL (ref 3.6–11.0)

## 2018-05-05 LAB — SALICYLATE LEVEL: Salicylate Lvl: <7 mg/dL (ref 2.8–30.0)

## 2018-05-05 LAB — POCT PREGNANCY, URINE: Preg Test, Ur: NEGATIVE

## 2018-05-05 LAB — ETHANOL: Alcohol, Ethyl (B): <10 mg/dL (ref <10)

## 2018-05-05 LAB — ACETAMINOPHEN LEVEL: Acetaminophen (Tylenol), Serum: <10 ug/mL — ABNORMAL LOW (ref 10–30)

## 2018-05-05 MED ORDER — ACETAMINOPHEN 500 MG PO TABS
1000.0000 mg | ORAL_TABLET | Freq: Once | ORAL | Status: AC
  Administered 2018-05-05: 1000 mg via ORAL
  Filled 2018-05-05: qty 2
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
## 2018-05-05 NOTE — ED Notes (Signed)
Patient is talking to self. When asked who she is speaking to, Patient states she is just practicing conversations.
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
## 2018-05-05 NOTE — ED Notes (Signed)
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
## 2018-05-05 NOTE — ED Notes (Signed)
Patient's belonging's include:  Silver-colored ring, metal earring, metal earring, pair sneakers, pair panties, pair pants, long-sleeve shirt, bra. belonging's placed in labeled belonging's bag.

## 2018-05-06 ENCOUNTER — Other Ambulatory Visit: Payer: Self-pay

## 2018-05-06 ENCOUNTER — Inpatient Hospital Stay
Admission: AD | Admit: 2018-05-06 | Discharge: 2018-06-08 | DRG: 885 | Disposition: A | Discharge status: Home / Self Care | Payer: BLUE CROSS/BLUE SHIELD | Attending: Psychiatry | Admitting: Psychiatry

## 2018-05-06 ENCOUNTER — Encounter: Payer: Self-pay | Admitting: Behavioral Health

## 2018-05-06 DIAGNOSIS — G2581 Restless legs syndrome: Secondary | ICD-10-CM | POA: Diagnosis not present

## 2018-05-06 DIAGNOSIS — Z818 Family history of other mental and behavioral disorders: Secondary | ICD-10-CM

## 2018-05-06 DIAGNOSIS — F17200 Nicotine dependence, unspecified, uncomplicated: Secondary | ICD-10-CM | POA: Diagnosis present

## 2018-05-06 DIAGNOSIS — F410 Panic disorder [episodic paroxysmal anxiety] without agoraphobia: Secondary | ICD-10-CM | POA: Diagnosis present

## 2018-05-06 DIAGNOSIS — F411 Generalized anxiety disorder: Secondary | ICD-10-CM | POA: Diagnosis present

## 2018-05-06 DIAGNOSIS — Z915 Personal history of self-harm: Secondary | ICD-10-CM | POA: Diagnosis not present

## 2018-05-06 DIAGNOSIS — F1220 Cannabis dependence, uncomplicated: Secondary | ICD-10-CM | POA: Diagnosis present

## 2018-05-06 DIAGNOSIS — G4700 Insomnia, unspecified: Secondary | ICD-10-CM | POA: Diagnosis present

## 2018-05-06 DIAGNOSIS — K5900 Constipation, unspecified: Secondary | ICD-10-CM | POA: Diagnosis present

## 2018-05-06 DIAGNOSIS — F17210 Nicotine dependence, cigarettes, uncomplicated: Secondary | ICD-10-CM | POA: Diagnosis present

## 2018-05-06 DIAGNOSIS — F250 Schizoaffective disorder, bipolar type: Principal | ICD-10-CM | POA: Diagnosis present

## 2018-05-06 DIAGNOSIS — F200 Paranoid schizophrenia: Secondary | ICD-10-CM | POA: Diagnosis not present

## 2018-05-06 DIAGNOSIS — F1210 Cannabis abuse, uncomplicated: Secondary | ICD-10-CM | POA: Diagnosis present

## 2018-05-06 DIAGNOSIS — Z79899 Other long term (current) drug therapy: Secondary | ICD-10-CM | POA: Diagnosis not present

## 2018-05-06 DIAGNOSIS — F315 Bipolar disorder, current episode depressed, severe, with psychotic features: Secondary | ICD-10-CM

## 2018-05-06 DIAGNOSIS — Z9114 Patient's other noncompliance with medication regimen: Secondary | ICD-10-CM

## 2018-05-06 DIAGNOSIS — F4481 Dissociative identity disorder: Secondary | ICD-10-CM | POA: Diagnosis present

## 2018-05-06 LAB — LITHIUM LEVEL: Lithium Lvl: <0.06 mmol/L — ABNORMAL LOW (ref 0.60–1.20)

## 2018-05-06 LAB — TSH: TSH: 5.367 u[IU]/mL — AB (ref 0.350–4.500)

## 2018-05-06 MED ORDER — ALUM & MAG HYDROXIDE-SIMETH 200-200-20 MG/5ML PO SUSP: 30.0000 mL | ORAL | Status: DC | PRN

## 2018-05-06 MED ORDER — LORAZEPAM 1 MG PO TABS
1.0000 mg | ORAL_TABLET | Freq: Four times a day (QID) | ORAL | Status: DC | PRN
  Administered 2018-05-06 – 2018-05-08 (×5): 1 mg via ORAL
  Filled 2018-05-06 (×6): qty 1

## 2018-05-06 MED ORDER — LITHIUM CARBONATE 150 MG PO CAPS
450.0000 mg | ORAL_CAPSULE | Freq: Two times a day (BID) | ORAL | Status: DC
  Administered 2018-05-06 – 2018-05-12 (×12): 450 mg via ORAL
  Filled 2018-05-06 (×12): qty 3

## 2018-05-06 MED ORDER — RISPERIDONE 1 MG PO TABS
1.0000 mg | ORAL_TABLET | Freq: Two times a day (BID) | ORAL | Status: DC
  Administered 2018-05-06 – 2018-05-09 (×6): 1 mg via ORAL
  Filled 2018-05-06 (×6): qty 1

## 2018-05-06 MED ORDER — ACETAMINOPHEN 325 MG PO TABS
650.0000 mg | ORAL_TABLET | Freq: Four times a day (QID) | ORAL | Status: DC | PRN
  Administered 2018-05-07 – 2018-06-02 (×7): 650 mg via ORAL
  Filled 2018-05-06 (×7): qty 2

## 2018-05-06 MED ORDER — OLANZAPINE 10 MG IM SOLR: 5.0000 mg | Freq: Once | INTRAMUSCULAR | Status: DC | PRN

## 2018-05-06 MED ORDER — RISPERIDONE 1 MG PO TABS: 1.0000 mg | ORAL_TABLET | Freq: Two times a day (BID) | ORAL | Status: DC

## 2018-05-06 MED ORDER — LORAZEPAM 1 MG PO TABS
1.0000 mg | ORAL_TABLET | Freq: Four times a day (QID) | ORAL | Status: DC | PRN
  Administered 2018-05-06: 1 mg via ORAL
  Filled 2018-05-06: qty 1

## 2018-05-06 MED ORDER — BENZTROPINE MESYLATE 1 MG PO TABS
0.5000 mg | ORAL_TABLET | Freq: Two times a day (BID) | ORAL | Status: DC
  Administered 2018-05-06 – 2018-05-09 (×6): 0.5 mg via ORAL
  Filled 2018-05-06 (×6): qty 1

## 2018-05-06 MED ORDER — MAGNESIUM HYDROXIDE 400 MG/5ML PO SUSP
30.0000 mL | Freq: Every day | ORAL | Status: DC | PRN
  Filled 2018-05-06 (×2): qty 30

## 2018-05-06 MED ORDER — HYDROXYZINE HCL 25 MG PO TABS: 50.0000 mg | ORAL_TABLET | Freq: Four times a day (QID) | ORAL | Status: DC | PRN

## 2018-05-06 MED ORDER — LITHIUM CARBONATE 150 MG PO CAPS
450.0000 mg | ORAL_CAPSULE | Freq: Two times a day (BID) | ORAL | Status: DC
  Filled 2018-05-06: qty 3

## 2018-05-06 MED ORDER — BENZTROPINE MESYLATE 1 MG PO TABS: 0.5000 mg | ORAL_TABLET | Freq: Two times a day (BID) | ORAL | Status: DC

## 2018-05-06 MED ORDER — HYDROXYZINE HCL 50 MG PO TABS
50.0000 mg | ORAL_TABLET | Freq: Four times a day (QID) | ORAL | Status: DC | PRN
  Administered 2018-05-08 – 2018-05-15 (×10): 50 mg via ORAL
  Filled 2018-05-06 (×10): qty 1

## 2018-05-06 NOTE — ED Notes (Signed)
Breakfast tray left in pt room. Pt sleeping 
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
## 2018-05-06 NOTE — ED Notes (Signed)
Hourly rounding reveals patient sleeping in room. No complaints, stable, in no acute distress. Q15 minute rounds and monitoring via Security Cameras to continue. 

## 2018-05-06 NOTE — ED Notes (Signed)
Pt given supper tray and sprite 
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
## 2018-05-06 NOTE — ED Notes (Signed)
SOC in progress with Dr Hermelinda MedicusAlvarado.

## 2018-05-06 NOTE — Plan of Care (Signed)
Pt continues to progress towards goals and d/c. RN will continue to monitor.  Problem: Education: Goal: Knowledge of [place] [facility] Education information/materials will improve Outcome: Progressing Goal: Mental status will improve Outcome: Progressing   Problem: Coping: Goal: Coping ability will improve Outcome: Progressing Goal: Will verbalize feelings Outcome: Progressing   Problem: Safety: Goal: Ability to remain free from injury will improve Outcome: Progressing   Problem: Self-Concept: Goal: Will verbalize positive feelings about self Outcome: Progressing   Problem: Education: Goal: Utilization of techniques to improve thought processes will improve Outcome: Progressing Goal: Knowledge of the prescribed therapeutic regimen will improve Outcome: Progressing   Problem: Activity: Goal: Interest or engagement in leisure activities will improve Outcome: Progressing Goal: Imbalance in normal sleep/wake cycle will improve Outcome: Progressing   Problem: Coping: Goal: Coping ability will improve Outcome: Progressing Goal: Will verbalize feelings Outcome: Progressing   Problem: Health Behavior/Discharge Planning: Goal: Ability to make decisions will improve Outcome: Progressing Goal: Compliance with therapeutic regimen will improve Outcome: Progressing   Problem: Role Relationship: Goal: Will demonstrate positive changes in social behaviors and relationships Outcome: Progressing   Problem: Safety: Goal: Ability to disclose and discuss suicidal ideas will improve Outcome: Progressing Goal: Ability to identify and utilize support systems that promote safety will improve Outcome: Progressing   Problem: Self-Concept: Goal: Will verbalize positive feelings about self Outcome: Progressing Goal: Level of anxiety will decrease Outcome: Progressing

## 2018-05-06 NOTE — BH Assessment (Signed)
[facility] by Dr. Jennet MaduroPucilowska.  Attending Physician will be Dr. Jennet MaduroPucilowska.   Patient has been [facility] to room 316, [facility] T'Ywan.   Intake Paper Work has been signed and placed on patient chart.  ER staff is aware of the admission:  Davy PiqueLuan, ER Kathrynn SpeedSectary   Dr. Alphonzo LemmingsMcShane, ER MD   Geralynn OchsJadeka, Patient's Nurse   Ethelene BrownsAnthony, Patient Access.

## 2018-05-06 NOTE — ED Notes (Signed)
Report given to Danella DeisShay, RN in the BMU, patient room will be 316
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
## 2018-05-06 NOTE — Tx Team (Signed)
Initial Treatment Plan 05/06/2018 6:20 PM Kimberly Boyle [id number]    PATIENT STRESSORS: Marital or family conflict Substance abuse Other: paranoia   PATIENT STRENGTHS: Communication skills Motivation for treatment/growth Supportive family/friends   PATIENT IDENTIFIED PROBLEMS: Coping skills  Medication compliance  paranoia  Family issues               DISCHARGE CRITERIA:  Ability to meet basic life and health needs Improved stabilization in mood, thinking, and/or behavior Motivation to continue treatment in a less acute level of care  PRELIMINARY DISCHARGE PLAN: Participate in family therapy Return to previous living arrangement  PATIENT/FAMILY INVOLVEMENT: This treatment plan has been presented to and reviewed with the patient, Kimberly Boyle, and/or family member.  The patient and family have been given the opportunity to ask questions and make suggestions.  Leamon ArntShatara K Ady Heimann, RN 05/06/2018, 6:20 PM
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
## 2018-05-06 NOTE — Progress Notes (Signed)
Nursing note 7p-7a  Pt observed isolating in room unit this shift. Displayed a animated affect and anxious mood upon interaction with this Clinical research associatewriter. Pt denies pain ,denies SI/HI, and also denies any audio or visual hallucinations at this time.But seems to be responding to internal stimuli. Pt laughing, and smiling at opposite bed in her room.  Pt complains of anxiety 7/10 but could not express why. See MAR for prn medication administration. Pt is now resting in bed with eyes closed, with no signs or symptoms of pain or distress noted. Pt continues to remain safe on the unit and is observed by rounding every 15 min. RN will continue to monitor.

## 2018-05-06 NOTE — ED Notes (Signed)
Patient talking to [name]

## 2018-05-06 NOTE — ED Provider Notes (Signed)
-----------------------------------------   2:59 AM on 05/06/2018 -----------------------------------------   Blood pressure (!) 141/81, pulse 73, temperature 98 F (36.7 C), temperature source Oral, resp. rate 19, height 5\' 4"  (1.626 m), weight 63.5 kg (140 lb), last menstrual period 04/11/2018, SpO2 99 %.  Patient was [facility] Dr. Hermelinda MedicusAlvarado who recommends inpatient psychiatric hospitalization and to continue IVC.  At her recommendation, will follow up on TSH and lithium.   ----------------------------------------- 5:00 AM on 05/06/2018 -----------------------------------------  Have noted results of TSH and lithium.    Irean HongSung, Jade J, MD 05/06/18 [phone]

## 2018-05-07 DIAGNOSIS — F250 Schizoaffective disorder, bipolar type: Principal | ICD-10-CM

## 2018-05-07 LAB — LIPID PANEL
Cholesterol: 190 mg/dL (ref 0–200)
HDL: 44 mg/dL (ref >40)
LDL CALC: 124 mg/dL — AB (ref 0–99)
Total CHOL/HDL Ratio: 4.3 RATIO
Triglycerides: 112 mg/dL (ref <150)
VLDL: 22 mg/dL (ref 0–40)

## 2018-05-07 LAB — HEMOGLOBIN A1C
HEMOGLOBIN A1C: 5.1 % (ref 4.8–5.6)
MEAN PLASMA GLUCOSE: 99.67 mg/dL

## 2018-05-07 LAB — TSH: TSH: 2.386 u[IU]/mL (ref 0.350–4.500)
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
## 2018-05-07 NOTE — Plan of Care (Signed)
Pt. Denies SI/HI/AVH. Pt. Verbally is able to contract for safety. Pt. Is monitored by staff for safety. Pt. Needs reinforcement on education provided. Pt. Mental status this evening continues to present paranoid and delusional. Pt. Isolative and withdrawn and does not participate this evening in unit activities or snacks. Pt. Is partially compliant this evening, failing to comply with prescribed EKG testing due to paranoia and was caught this evening attempting to cheek her medications. Will notify treatment team during morning report of cheeking precautions of medications.    Problem: Safety: Goal: Ability to remain free from injury will improve 05/07/2018 2305 by Lenox PondsStevens, Neena Beecham J, RN Outcome: Progressing 05/07/2018 2257 by Lenox PondsStevens, [place] J, RN Outcome: Progressing   Problem: Safety: Goal: Ability to disclose and discuss suicidal ideas will improve 05/07/2018 2305 by Lenox PondsStevens, Nabilah Davoli J, RN Outcome: Progressing 05/07/2018 2257 by Lenox PondsStevens, Auren Valdes J, RN Outcome: Progressing   Problem: Education: Goal: Knowledge of  General Education information/materials will improve 05/07/2018 2305 by Lenox PondsStevens, Rohil Lesch J, RN Outcome: Not Progressing 05/07/2018 2257 by Lenox PondsStevens, Keaghan Staton J, RN Outcome: Not Progressing Goal: Mental status will improve 05/07/2018 2305 by Lenox PondsStevens, Aleecia Tapia J, RN Outcome: Not Progressing 05/07/2018 2257 by Lenox PondsStevens, Sumiko Ceasar J, RN Outcome: Not Progressing   Problem: Education: Goal: Knowledge of the prescribed therapeutic regimen will improve 05/07/2018 2305 by Lenox PondsStevens, Glorine Hanratty J, RN Outcome: Not Progressing 05/07/2018 2257 by Lenox PondsStevens, Philomene Haff J, RN Outcome: Not Progressing   Problem: Activity: Goal: Interest or engagement in leisure activities will improve 05/07/2018 2305 by Lenox PondsStevens, Tracey Stewart J, RN Outcome: Not Progressing 05/07/2018 2257 by Lenox PondsStevens, Mechille Varghese J, RN Outcome: Not Progressing   Problem: Health Behavior/Discharge Planning: Goal: Compliance with therapeutic regimen will  improve 05/07/2018 2305 by Lenox PondsStevens, Rosaleah Person J, RN Outcome: Not Progressing 05/07/2018 2257 by Lenox PondsStevens, [place] Kallio J, RN Outcome: Progressing

## 2018-05-07 NOTE — Progress Notes (Signed)
D: Pt denies SI/HI/AVH. Pt. Presents very paranoid, preoccupied, and delusional as evidence by statements made and behaviors. Pt. Reports, "Do the police know that I'm here? Are the people able to know that I'm here? Well the police officer that knows me has a tracking device on my phone and will come here to take me to [place], because he's a terrorist and is going to rap me". Pt. Speech is soft, but tangential and rapid. Pt. Facial expression and eye contact is avertive and flat. Pt. Appears to be responding to some internal stimulation.   A: Q x 15 minute observation checks were completed for safety. Patient was provided with education, but needs reinforcement.  Patient was given/offered medications per orders. Patient  was encourage to attend groups, participate in unit activities and continue with plan of care. Pt. Chart and plans of care reviewed. Pt. Given support and encouragement.    R: Patient is partially compliant with medications and procedures. Pt. Was caught attempting several times to cheek her medications and save them. Pt. Was educated that this behavior is not appropriate. Pt. Not able to be compliant with EKG testing required for med management. Will continue to encourage compliance. Will notify next shift of cheeking precautions.             Precautionary checks every 15 minutes for safety maintained, room free of safety hazards, patient sustains no injury or falls during this shift. Will endorse care to next shift.

## 2018-05-07 NOTE — [facility] Suicide Risk Assessment (Signed)
[facility]   Nursing information obtained from:  Patient Demographic factors:  Caucasian, Adolescent or young adult Current Mental Status:  NA Loss Factors:  NA Historical Factors:  NA Risk Reduction Factors:  Positive social support, Living with another person, especially a relative  Total Time spent with patient: 45 minutes Principal Problem: active psychosis Diagnosis:   Patient Active Problem List   Diagnosis Date Noted  . Schizoaffective disorder, bipolar type (HCC) [F25.0] 05/06/2018  . Suicide attempt (HCC) [T14.91XA] 08/20/2017  . Cannabis abuse [F12.10] 08/20/2017  . Self-inflicted laceration of wrist [S61.519A] 08/20/2017  . Acetaminophen poisoning [T39.1X1A] 08/20/2017  . Generalized anxiety disorder [F41.1] 01/05/2017  . Mood disorder (HCC) [F39] 01/05/2017   Subjective Data: see H&P  Continued Clinical Symptoms:  Alcohol Use Disorder Identification Test Final Score (AUDIT): 0 The "Alcohol Use Disorders Identification Test", Guidelines for Use in Primary Care, Second Edition.  World Science writerHealth Organization [facility](WHO). Score between 0-7:  no or low risk or alcohol related problems. Score between 8-15:  moderate risk of alcohol related problems. Score between 16-19:  high risk of alcohol related problems. Score 20 or above:  warrants further diagnostic evaluation for alcohol dependence and treatment.   CLINICAL FACTORS:   Schizophrenia:   Command hallucinatons Less than 24 years old Paranoid or undifferentiated type   Musculoskeletal: Strength & Muscle Tone: within normal limits Gait & Station: normal Patient leans: N/A  Psychiatric Specialty Exam: Physical Exam  ROS  Blood pressure 114/80, pulse (!) 114, temperature 98.1 F (36.7 C), temperature source Oral, resp. rate 18, height 5\' 2"  (1.575 m), weight 63.5 kg (140 lb), last menstrual period 04/11/2018, SpO2 100 %.Body mass index is 25.61 kg/m.  General Appearance: Fairly Groomed  Eye  Contact:  Good  Speech:  Normal Rate  Volume:  Normal  Mood:  Depressed  Affect:  Congruent, Constricted and Depressed  Thought Process:  Goal Directed  Orientation:  Full (Time, Place, and Person)  Thought Content:  Delusions and Paranoid Ideation  Suicidal Thoughts:  No  Homicidal Thoughts:  No  Memory:  Immediate;   Fair Recent;   Fair Remote;   Fair  Judgement:  Impaired  Insight:  Lacking  Psychomotor Activity:  Normal  Concentration:  Concentration: Fair and Attention Span: Fair  Recall:  [employer]is[employer]Fair  Fund of Knowledge:  Fair  Language:  Fair  Akathisia:  No  Handed:  Right  AIMS (if indicated):     Assets:  Manufacturing systems engineerCommunication Skills Physical Health  ADL's:  Intact  Cognition:  WNL  Sleep:  Number of Hours: 7.25      COGNITIVE FEATURES THAT CONTRIBUTE TO RISK:  Closed-mindedness and Thought constriction (tunnel vision)    SUICIDE RISK:   Minimal: No identifiable suicidal ideation.  Patients presenting with no risk factors but with morbid ruminations; may be classified as minimal risk based on the severity of the depressive symptoms  PLAN OF CARE: medication and therapy. See H&P for details.   I certify that inpatient services furnished can reasonably be expected to improve the patient's condition.   Allisha Harter, MD 05/07/2018, 2:02 PM

## 2018-05-07 NOTE — BHH Group Notes (Signed)
LCSW Group Therapy Note  05/07/2018 1:15pm  Type of Therapy and Topic:  Group Therapy:  Cognitive Distortions  Participation Level:  Did Not Attend   Description of Group:    Patients in this group will be introduced to the topic of cognitive distortions.  Patients will identify and describe cognitive distortions, describe the feelings these distortions create for them.  Patients will identify one or more situations in their personal life where they have cognitively distorted thinking and will verbalize challenging this cognitive distortion through positive thinking skills.  Patients will practice the skill of using positive affirmations to challenge cognitive distortions using affirmation cards.    Therapeutic Goals:  1. Patient will identify two or more cognitive distortions they have used 2. Patient will identify one or more emotions that stem from use of a cognitive distortion 3. Patient will demonstrate use of a positive affirmation to counter a cognitive distortion through discussion and/or role play. 4. Patient will describe one way cognitive distortions can be detrimental to wellness   Summary of Patient Progress: Pt was invited to attend group but chose not to attend. CSW will continue to encourage pt to attend group throughout their admission.      Therapeutic Modalities:   Cognitive Behavioral Therapy Motivational Interviewing   Mckynleigh Mussell  CUEBAS-COLON, LCSW 05/07/2018 3:54 PM   

## 2018-05-08 ENCOUNTER — Encounter: Payer: Self-pay | Admitting: Psychiatry

## 2018-05-08 DIAGNOSIS — F17200 Nicotine dependence, unspecified, uncomplicated: Secondary | ICD-10-CM | POA: Diagnosis present
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
## 2018-05-08 NOTE — Plan of Care (Addendum)
Patient found asleep in bed upon my arrival. Patient is neither visible nor social this evening. Patient is isolative to her room throughout the evening. Complains of depression and anxiety, given Ativan PRN with positive results. Patient is minimally verbal throughout the assessment. Eye contact is brief. Hygiene is poor. Continues to report AH but is unable/unwilling to discuss the nature of it. Patient does not seem to be progressing. Denies HI/AVH. Denies SI/HI/VH. Reports appetite is poor. Q 15 minute checks maintained. Will continue to monitor throughout the shift. Patient slept 6.75 hours. No apparent distress. Compliant with EKG. VSS. Will endorse care to oncoming shift.  Problem: Safety: Goal: Ability to remain free from injury will improve Outcome: Progressing   Problem: Education: Goal: Knowledge of [facility] Education information/materials will improve Outcome: Not Progressing Goal: Mental status will improve Outcome: Not Progressing   Problem: Coping: Goal: Coping ability will improve Outcome: Not Progressing Goal: Will verbalize feelings Outcome: Not Progressing   Problem: Self-Concept: Goal: Will verbalize positive feelings about self Outcome: Not Progressing   Problem: Activity: Goal: Interest or engagement in leisure activities will improve Outcome: Not Progressing Goal: Imbalance in normal sleep/wake cycle will improve Outcome: Not Progressing   Problem: Self-Concept: Goal: Level of anxiety will decrease Outcome: Not Progressing
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
## 2018-05-08 NOTE — Progress Notes (Signed)
Received Kimberly Boyle this AM in her room asleep, She refused breakfast. She was compliant with her medications and later requested an Ativan for anxiety. She verbalized being worried about her mother and will call her later. She was OOB briefly for her meals and requested Vistaril for anxiety.

## 2018-05-08 NOTE — BHH Group Notes (Signed)
LCSW Group Therapy Note 05/08/2018 1:15pm  Type of Therapy and Topic: Group Therapy: Feelings Around Returning Home & Establishing a Supportive Framework and Supporting Oneself When Supports Not Available  Participation Level: Did Not Attend  Description of Group:  Patients first processed thoughts and feelings about upcoming discharge. These included fears of upcoming changes, lack of change, new living environments, judgements and expectations from others and overall stigma of mental health issues. The group then discussed the definition of a supportive framework, what that looks and feels like, and how do to discern it from an unhealthy non-supportive network. The group identified different types of supports as well as what to do when your family/friends are less than helpful or unavailable  Therapeutic Goals  1. Patient will identify one healthy supportive network that they can use at discharge. 2. Patient will identify one factor of a supportive framework and how to tell it from an unhealthy network. 3. Patient able to identify one coping skill to use when they do not have positive supports from others. 4. Patient will demonstrate ability to communicate their needs through discussion and/or role plays.  Summary of Patient Progress:  Pt was invited to attend group but chose not to attend. CSW will continue to encourage pt to attend group throughout their admission.   Therapeutic Modalities Cognitive Behavioral Therapy Motivational Interviewing   Kimberly Boyle  CUEBAS-COLON, LCSW 05/08/2018 9:50 AM 

## 2018-05-09 MED ORDER — RISPERIDONE 1 MG PO TABS
2.0000 mg | ORAL_TABLET | Freq: Two times a day (BID) | ORAL | Status: DC
  Administered 2018-05-09 – 2018-05-10 (×2): 2 mg via ORAL
  Filled 2018-05-09 (×2): qty 2

## 2018-05-09 MED ORDER — GABAPENTIN 300 MG PO CAPS
300.0000 mg | ORAL_CAPSULE | Freq: Three times a day (TID) | ORAL | Status: DC
  Administered 2018-05-09 – 2018-06-08 (×88): 300 mg via ORAL
  Filled 2018-05-09 (×89): qty 1

## 2018-05-09 MED ORDER — LORAZEPAM 0.5 MG PO TABS: 0.5000 mg | ORAL_TABLET | Freq: Four times a day (QID) | ORAL | Status: DC | PRN

## 2018-05-09 NOTE — Tx Team (Addendum)
Interdisciplinary Treatment and Diagnostic Plan Update  05/09/2018 Time of Session: 10:30am Kimberly Boyle MRN: [id number]  Principal Diagnosis: Schizoaffective disorder, bipolar type [facility])  Secondary Diagnoses: Principal Problem:   Schizoaffective disorder, bipolar type (HCC) Active Problems:   Cannabis use disorder, moderate, dependence (HCC)   Tobacco use disorder   Current Medications:  Current Facility-Administered Medications  Medication Dose Route Frequency Provider Last Rate Last Dose  . acetaminophen (TYLENOL) tablet 650 mg  650 mg Oral Q6H PRN [name], [name], MD   650 mg at 05/07/18 1224  . alum & mag hydroxide-simeth (MAALOX/MYLANTA) 200-200-20 MG/5ML suspension 30 mL  30 mL Oral Q4H PRN [name], [name], MD      . gabapentin (NEURONTIN) capsule 300 mg  300 mg Oral TID Pucilowska, [name], MD      . hydrOXYzine (ATARAX/VISTARIL) tablet 50 mg  50 mg Oral Q6H PRN [name], [name], MD   50 mg at 05/08/18 1743  . lithium carbonate capsule 450 mg  450 mg Oral BID WC [name], [name], MD   450 mg at 05/09/18 0810  . magnesium hydroxide (MILK OF MAGNESIA) suspension 30 mL  30 mL Oral Daily PRN [name], [name], MD      . OLANZapine (ZYPREXA) injection 5 mg  5 mg Intramuscular Once PRN [name], [name], MD      . risperiDONE (RISPERDAL) tablet 2 mg  2 mg Oral BID Pucilowska, [name], MD       PTA Medications: Medications Prior to Admission  Medication Sig Dispense Refill Last Dose  . benztropine (COGENTIN) 0.5 MG tablet Take 1 tablet (0.5 mg total) by mouth 2 (two) times daily. 60 tablet 1 unknown at unknown  . fluPHENAZine (PROLIXIN) 10 MG tablet Take 20 mg by mouth at bedtime.  0 unknown at unknown  . fluPHENAZine (PROLIXIN) 5 MG tablet Take 5 mg by mouth 2 (two) times daily.  0 unknown at unknown  . hydrOXYzine (ATARAX/VISTARIL) 25 MG tablet Take 1 tablet (25 mg total) by mouth every 8 (eight) hours as needed for anxiety. (Patient not taking: Reported on 05/05/2018) 90 tablet  1 Not Taking at Unknown time  . lithium carbonate (ESKALITH) 450 MG CR tablet Take 1 tablet (450 mg total) by mouth 2 (two) times daily. 60 tablet 1 unknown at unknown  . risperiDONE (RISPERDAL) 2 MG tablet Take 0.5 tablets (1 mg total) by mouth daily. 15 tablet 1 unknown at unknown  . risperiDONE microspheres (RISPERDAL CONSTA) 25 MG injection Inject 2 mLs (25 mg total) into the muscle every 14 (fourteen) days. First dose 12/17/2017 (Patient not taking: Reported on 05/05/2018) 1 each 1 Not Taking  . 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   Physician Treatment Plan for Primary Diagnosis: Schizoaffective disorder, bipolar type (HCC) Long Term Goal(s): Improvement in symptoms so as ready for discharge Improvement in symptoms so as ready for discharge   Short Term Goals: Ability to identify changes in lifestyle to reduce recurrence of condition will improve Ability to identify changes in lifestyle to reduce recurrence of condition will improve  Medication Management: Evaluate patient's response, side effects, and tolerance of medication regimen.  Therapeutic Interventions: 1 to 1 sessions, Unit Group sessions  and Medication administration.  Evaluation of Outcomes: Progressing  Physician Treatment Plan for Secondary Diagnosis: Principal Problem:   Schizoaffective disorder, bipolar type (HCC) Active Problems:   Cannabis use disorder, moderate, dependence (HCC)   Tobacco use disorder  Long Term Goal(s): Improvement in symptoms so as ready for discharge Improvement in symptoms so as ready for discharge   Short Term Goals: Ability to identify  changes in lifestyle to reduce recurrence of condition will improve Ability to identify changes in lifestyle to reduce recurrence of condition will improve     Medication Management: Evaluate patient's response, side effects, and tolerance of medication regimen.  Therapeutic Interventions: 1 to 1 sessions, Unit Group sessions and Medication administration.  Evaluation of Outcomes: Progressing   RN Treatment Plan for Primary Diagnosis: Schizoaffective disorder, bipolar type (HCC) Long Term Goal(s): Knowledge of disease and therapeutic regimen to maintain health will improve  Short Term Goals: Ability to identify and develop effective coping behaviors will improve and Compliance with prescribed medications will improve  Medication Management: RN will administer medications as ordered by provider, will assess and evaluate patient's response and provide education to patient for prescribed medication. RN will report any adverse and/or side effects to prescribing provider.  Therapeutic Interventions: 1 on 1 counseling sessions, Psychoeducation, Medication administration, Evaluate responses to treatment, Monitor vital signs and CBGs as ordered, Perform/monitor CIWA, COWS, AIMS and Fall Risk screenings as ordered, Perform wound care treatments as ordered.  Evaluation of Outcomes: Progressing   LCSW Treatment Plan for Primary Diagnosis: Schizoaffective disorder, bipolar type (HCC) Long Term Goal(s): Safe transition to appropriate next level of care at discharge, Engage patient in therapeutic group addressing interpersonal concerns.  Short Term Goals: Engage patient in aftercare planning with referrals and resources, Identify triggers associated with mental health/substance abuse issues and Increase skills for wellness and recovery  Therapeutic Interventions: Assess for all discharge needs, 1 to 1 time with Social worker, Explore available resources and support systems, Assess for adequacy in  community support network, Educate family and significant other(s) on suicide prevention, Complete Psychosocial Assessment, Interpersonal group therapy.  Evaluation of Outcomes: Progressing   Progress in Treatment: Attending groups: No. Participating in groups: No. Taking medication as prescribed: Yes. Toleration medication: Yes. Family/Significant other contact made: No, will contact:    Patient understands diagnosis: Yes. Discussing patient identified problems/goals with staff: Yes. Medical problems stabilized or resolved: No. Denies suicidal/homicidal ideation: No. Issues/concerns per patient self-inventory: No. Other:    New problem(s) identified: No, Describe:     New Short Term/Long Term Goal(s):  Patient Goals:  To feel safe.   Discharge Plan or Barriers: Pt will likely return home with her parents and follow up with outpatient provider in [place].  Reason for Continuation of Hospitalization: Aggression Delusions  Medication stabilization  Estimated Length of Stay: 5-7 days    Attendees: Patient:Kimberly Boyle 05/09/2018 4:20 PM  Physician: Kristine LineaJolanta Pucilowska, MD 05/09/2018 4:20 PM  Nursing:  05/09/2018 4:20 PM  RN Care Manager: 05/09/2018 4:20 PM  Social Worker: Jake SharkSara Laws, LCSW 05/09/2018 4:20 PM  Recreational Therapist: Garret ReddishShay Rickie Gutierres, LRT 05/09/2018 4:20 PM  Other: Johny Shearsassandra Jarrett, LCSWA 05/09/2018 4:20 PM  Other:  05/09/2018 4:20 PM  Other: 05/09/2018 4:20 PM    Scribe for Treatment Team: Glennon MacSara P Laws, LCSW 05/09/2018 4:20 PM
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
## 2018-05-09 NOTE — Plan of Care (Addendum)
Patient found awake in bed upon my arrival. Patient is neither visible nor social this evening. Continues to report depression and anxiety. Patient is slightly more verbal tonight but speech remains minimal and guarded. Remains isolative. Only left room to obtain snack. Appetite is improved. Denies SI/HI/AVH. Denies pain. Patient has no scheduled HS medications. Q 15 minute checks maintained. Will continue to monitor throughout the shift. Patient slept 7 hours. No apparent distress. Received more belongings from outside. Inventory completed. Will endorse care to oncoming shift.  Problem: Education: Goal: Mental status will improve Outcome: Progressing   Problem: Coping: Goal: Coping ability will improve Outcome: Progressing   Problem: Safety: Goal: Ability to remain free from injury will improve Outcome: Progressing   Problem: Self-Concept: Goal: Level of anxiety will decrease Outcome: Progressing   Problem: Coping: Goal: Will verbalize feelings Outcome: Not Progressing   Problem: Self-Concept: Goal: Will verbalize positive feelings about self Outcome: Not Progressing   Problem: Activity: Goal: Interest or engagement in leisure activities will improve Outcome: Not Progressing Goal: Imbalance in normal sleep/wake cycle will improve Outcome: Not Progressing

## 2018-05-09 NOTE — Progress Notes (Signed)
Recreation Therapy Notes   Date: 05/09/2018  Time: 9:30 am   Location: Craft Room   Behavioral response: N/A   Intervention Topic: Self-esteem  Discussion/Intervention: Patient did not attend group.   Clinical Observations/Feedback:  Patient did not attend group.   Allysha Tryon LRT/CTRS        Kenly Xiao 05/09/2018 11:23 AM

## 2018-05-09 NOTE — [facility] Group Notes (Signed)
LCSW Group Therapy Note   05/09/2018 1:00pm   Type of Therapy and Topic:  Group Therapy:  Overcoming Obstacles   Participation Level:  Did Not Attend   Description of Group:    In this group patients will be encouraged to explore what they see as obstacles to their own wellness and recovery. They will be guided to discuss their thoughts, feelings, and behaviors related to these obstacles. The group will process together ways to cope with barriers, with attention given to specific choices patients can make. Each patient will be challenged to identify changes they are motivated to make in order to overcome their obstacles. This group will be process-oriented, with patients participating in exploration of their own experiences as well as giving and receiving support and challenge from other group members.   Therapeutic Goals: 1. Patient will identify personal and current obstacles as they relate to admission. 2. Patient will identify barriers that currently interfere with their wellness or overcoming obstacles.  3. Patient will identify feelings, thought process and behaviors related to these barriers. 4. Patient will identify two changes they are willing to make to overcome these obstacles:      Summary of Patient Progress      Therapeutic Modalities:   Cognitive Behavioral Therapy Solution Focused Therapy Motivational Interviewing Relapse Prevention Therapy  Glennon MacSara P Skie Vitrano, LCSW 05/09/2018 4:19 PM

## 2018-05-09 NOTE — Progress Notes (Signed)
Recreation Therapy Notes  INPATIENT RECREATION THERAPY ASSESSMENT  Patient Details Name: Kimberly Boyle MRN: [id number] DOB: 11/22/1995 Today's Date: 05/09/2018       Information Obtained From: (Patient refused)  Able to Participate in Assessment/Interview:    Patient Presentation:    Reason for Admission (Per Patient):    Patient Stressors:    Coping Skills:      Leisure Interests (2+):     Frequency of Recreation/Participation:    Awareness of Community Resources:     [employer] Resources:     Current Use:    If no, Barriers?:    Expressed Interest in [employer] CorporationCommunity Resource Information:    [place]     Patient Main Form of Transportation:    Patient Strengths:     Patient Identified Areas of Improvement:     Patient Goal for Hospitalization:     Current SI (including self-harm):     Current HI:     Current AVH:    Staff Intervention Plan:    Consent to Intern Participation:    Kimberly Boyle 05/09/2018, 3:00 PM
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
## 2018-05-10 MED ORDER — DOCUSATE SODIUM 100 MG PO CAPS
100.0000 mg | ORAL_CAPSULE | Freq: Every day | ORAL | Status: DC
  Administered 2018-05-10 – 2018-06-08 (×29): 100 mg via ORAL
  Filled 2018-05-10 (×31): qty 1

## 2018-05-10 MED ORDER — RISPERIDONE 1 MG PO TABS
3.0000 mg | ORAL_TABLET | Freq: Two times a day (BID) | ORAL | Status: AC
  Administered 2018-05-10: 3 mg via ORAL
  Filled 2018-05-10: qty 3

## 2018-05-10 MED ORDER — PALIPERIDONE ER 3 MG PO TB24
9.0000 mg | ORAL_TABLET | Freq: Every day | ORAL | Status: DC
  Administered 2018-05-11 – 2018-05-20 (×10): 9 mg via ORAL
  Filled 2018-05-10 (×10): qty 3

## 2018-05-10 MED ORDER — MAGNESIUM CITRATE PO SOLN
1.0000 | Freq: Once | ORAL | Status: AC
  Administered 2018-05-10: 1 via ORAL
  Filled 2018-05-10: qty 296

## 2018-05-10 NOTE — Plan of Care (Signed)
Patient is alert to self, time and place, disoriented to situation. Patient denies pain, but states, " I do not feel well today." Patient complains of constipation; no Bowel movement in 4 days. Psychiatrist made aware. Patient continues to be isolative, disheveled in dress with poor hygiene.  Affect is fat and depressed. Patient denies having suicidal thoughts or homicidal thoughts. Patient denies Auditory hallucinations although patient seems to be responding to internal stimuli at times. Patient did not want to eat breakfast this morning states she has not been eating a lot. Nurse removed several containers of juice and full containers of cereal out of room and educated on hygiene and cleanliness of environment.  Nurse will continue to monitor.

## 2018-05-10 NOTE — Progress Notes (Signed)
Recreation Therapy Notes          Kimberly Boyle 05/10/2018 2:36 PM 

## 2018-05-10 NOTE — [facility] Group Notes (Signed)
05/10/2018 1PM  Type of Therapy/Topic:  Group Therapy:  Feelings about Diagnosis  Participation Level:  Did Not Attend   Description of Group:   This group will allow patients to explore their thoughts and feelings about diagnoses they have received. Patients will be guided to explore their level of understanding and acceptance of these diagnoses. Facilitator will encourage patients to process their thoughts and feelings about the reactions of others to their diagnosis and will guide patients in identifying ways to discuss their diagnosis with significant others in their lives. This group will be process-oriented, with patients participating in exploration of their own experiences, giving and receiving support, and processing challenge from other group members.   Therapeutic Goals: 1. Patient will demonstrate understanding of diagnosis as evidenced by identifying two or more symptoms of the disorder 2. Patient will be able to express two feelings regarding the diagnosis 3. Patient will demonstrate their ability to communicate their needs through discussion and/or role play  Summary of Patient Progress: Patient was encouraged and invited to attend group. Patient did not attend group. Social worker will continue to encourage group participation in the future.        Therapeutic Modalities:   Cognitive Behavioral Therapy Brief Therapy Feelings Identification    Johny ShearsCassandra  Aunika Kirsten, LCSW 05/10/2018 4:32 PM

## 2018-05-10 NOTE — [facility] Group Notes (Signed)
LCSW Group Therapy Note 05/10/2018 9:30AM  Type of Therapy and Topic:  Group Therapy:  Setting Goals  Participation Level:  Did Not Attend  Description of Group: In this process group, patients discussed using strengths to work toward goals and address challenges.  Patients identified two positive things about themselves and one goal they were working on.  Patients were given the opportunity to share openly and support each other's plan for self-empowerment.  The group discussed the value of gratitude and were encouraged to have a daily reflection of positive characteristics or circumstances.  Patients were encouraged to identify a plan to utilize their strengths to work on current challenges and goals.  Therapeutic Goals 1. Patient will verbalize personal strengths/positive qualities and relate how these can assist with achieving desired personal goals 2. Patients will verbalize affirmation of peers plans for personal change and goal setting 3. Patients will explore the value of gratitude and positive focus as related to successful achievement of goals 4. Patients will verbalize a plan for regular reinforcement of personal positive qualities and circumstances.  Summary of Patient Progress:       Therapeutic Modalities Cognitive Behavioral Therapy Motivational Interviewing    Kimberly MacSara P Dartanian Knaggs, LCSW 05/10/2018 11:56 AM

## 2018-05-11 MED ORDER — ENSURE ENLIVE PO LIQD
237.0000 mL | Freq: Three times a day (TID) | ORAL | Status: DC
  Administered 2018-05-11 – 2018-06-03 (×61): 237 mL via ORAL

## 2018-05-11 NOTE — Progress Notes (Signed)
Recreation Therapy Notes   Date: 05/11/2018  Time: 9:30 pm   Location: Craft Room   Behavioral response: N/A   Intervention Topic: Stress  Discussion/Intervention: Patient did not attend group.   Clinical Observations/Feedback:  Patient did not attend group.   Djeneba Barsch LRT/CTRS        Savior Himebaugh 05/11/2018 12:08 PM

## 2018-05-11 NOTE — [facility] Suicide Risk Assessment (Signed)
[facility] INPATIENT:  Family/Significant Other Suicide Prevention Education  Suicide Prevention Education:  Education Completed; Mother, Kimberly Boyle ,  (name of family member/significant other) has been identified by the patient as the family member/significant other with whom the patient will be residing, and identified as the person(s) who will aid the patient in the event of a mental health crisis (suicidal ideations/suicide attempt).  With written consent from the patient, the family member/significant other has been provided the following suicide prevention education, prior to the and/or following the discharge of the patient.  The suicide prevention education provided includes the following:  Suicide risk factors  Suicide prevention and interventions  National Suicide Hotline telephone number  [facility] assessment telephone number  [facility] Emergency Assistance 911  [facility] and/or Residential [facility] Crisis Unit telephone number  Request made of family/significant other to:  Remove weapons (e.g., guns, rifles, knives), all items previously/currently identified as safety concern.    Remove drugs/medications (over-the-counter, prescriptions, illicit drugs), all items previously/currently identified as a safety concern.  The family member/significant other verbalizes understanding of the suicide prevention education information provided.  The family member/significant other agrees to remove the items of safety concern listed above.  She shares a lot of Pt history.  She has a long history of self-harm, risky behaviors and poor insight into her illness.  She expresses a lot of concerns about her safety, however has realistic expectations about what will be achieved in Pt hospitalization.  She shares she tries to keep Pt safe, but with her AH and delusions this is proving more difficult.  She and her husband seem to be well versed in Pt triggers and "red flag  behaviors".  She plans to visit frequently now that Pt is closer by.  She says Pt has shared she feels safe here and that she is hopeful that she will agree to whatever follow up is recommended.  She will assist in anyway she can to help Pt stay on medications and get to appointments. Kimberly Boyle 05/11/2018, 12:55 PM

## 2018-05-11 NOTE — Plan of Care (Signed)
Pt out in the milieu with peers. Pt denies SI/HI. Pt asked for anxiety medication and given vistaril 50 mg 1947. Pt is receptive to treatment and safety maintained on unit. Will continue to monitor. Problem: Education: Goal: Knowledge of [facility] Education information/materials will improve Outcome: Progressing   Problem: Coping: Goal: Coping ability will improve Outcome: Progressing Goal: Will verbalize feelings Outcome: Progressing   Problem: Safety: Goal: Ability to remain free from injury will improve Outcome: Progressing   Problem: Self-Concept: Goal: Will verbalize positive feelings about self Outcome: Progressing   Problem: Education: Goal: Utilization of techniques to improve thought processes will improve Outcome: Progressing

## 2018-05-11 NOTE — Plan of Care (Signed)
Patient is alert and oriented, denies SI, HI and AVH although patient can be seen talking to self and laughing. Patient complains of constipation but is now passing gas. Patient states she did not finish entire magnesium citrate from yesterday. Patient continues to isolate to room; not attending any groups. Patient remains flat and sad affect but pleasant. Nurse must check to make sure patient actually swallows medication. Patient has very poor hygiene; disheveled in hospital scrubs. Patient remains free of injury. Nurse will continue to monitor and encourage bathing; and group activity. Problem: Education: Goal: Knowledge of [facility] Education information/materials will improve Outcome: Not Progressing Goal: Mental status will improve Outcome: Not Progressing   Problem: Coping: Goal: Coping ability will improve Outcome: Not Progressing Goal: Will verbalize feelings Outcome: Not Progressing   Problem: Safety: Goal: Ability to remain free from injury will improve Outcome: Not Progressing   Problem: Self-Concept: Goal: Will verbalize positive feelings about self Outcome: Not Progressing

## 2018-05-11 NOTE — Progress Notes (Signed)
[facility]  05/11/2018 3:27 PM Kimberly GenerousSarah Belle Boyle  MRN:  [id number]  Subjective:    Ms. Kimberly Boyle is cool and collected today but still disorganized and paranoid. She feels safe in the hospital but not at home where she expects to be raped by a "cop from [place] She acceptes medications and tolerates Invega well. She still refuses Invega sustenna injections.  Principal Problem: Schizoaffective disorder, bipolar type (HCC) Diagnosis:   Patient Active Problem List   Diagnosis Date Noted  . Schizoaffective disorder, bipolar type (HCC) [F25.0] 05/06/2018    Priority: High  . Tobacco use disorder [F17.200] 05/08/2018  . Suicide attempt (HCC) [T14.91XA] 08/20/2017  . Cannabis use disorder, moderate, dependence (HCC) [F12.20] 08/20/2017  . Self-inflicted laceration of wrist [S61.519A] 08/20/2017  . Acetaminophen poisoning [T39.1X1A] 08/20/2017  . Generalized anxiety disorder [F41.1] 01/05/2017  . Mood disorder (HCC) [F39] 01/05/2017   Total Time spent with patient: 20 minutes  Past Psychiatric History: schizophrenia  Past Medical History:  Past Medical History:  Diagnosis Date  . Anxiety   . Arthritis   . Bipolar disorder (HCC)   . Depression   . Major depressive disorder   . Panic disorder   . Schizophrenia (HCC)    History reviewed. No pertinent surgical history. Family History:  Family History  Problem Relation Age of Onset  . Post-traumatic stress disorder Mother   . Depression Mother   . Depression Maternal Uncle   . Cancer Maternal Grandmother    Family Psychiatric  History: none Social History:  Social History   Substance and Sexual Activity  Alcohol Use No  . Frequency: Never     Social History   Substance and Sexual Activity  Drug Use No  . Types: Marijuana    Social History   Socioeconomic History  . Marital status: Single    Spouse name: Not on file  . Number of children: 0  . Years of education: Not on file  . Highest education  level: High school graduate  Occupational History    Comment: unemployed  Social Needs  . Financial resource strain: Not on file  . Food insecurity:    Worry: Not on file    Inability: Not on file  . Transportation needs:    Medical: No    Non-medical: No  Tobacco Use  . Smoking status: Current Every Day Smoker    Packs/day: 0.25    Types: Cigarettes  . Smokeless tobacco: Never Used  Substance and Sexual Activity  . Alcohol use: No    Frequency: Never  . Drug use: No    Types: Marijuana  . Sexual activity: Not Currently  Lifestyle  . Physical activity:    Days per week: 0 days    Minutes per session: 0 min  . Stress: To some extent  Relationships  . Social connections:    Talks on phone: Not on file    Gets together: Not on file    Attends religious service: Never    Active member of club or organization: No    Attends meetings of clubs or organizations: Never    Relationship status: Never married  Other Topics Concern  . Not on file  Social History Narrative   ** Merged History Encounter **       Additional Social History:                         Sleep: Fair  Appetite:  Fair  Current  Medications: Current Facility-Administered Medications  Medication Dose Route Frequency Provider Last Rate Last Dose  . acetaminophen (TYLENOL) tablet 650 mg  650 mg Oral Q6H PRN [name], [name], MD   650 mg at 05/07/18 1224  . alum & mag hydroxide-simeth (MAALOX/MYLANTA) 200-200-20 MG/5ML suspension 30 mL  30 mL Oral Q4H PRN [name], [name], MD      . docusate sodium (COLACE) capsule 100 mg  100 mg Oral Daily Sony Schlarb B, MD   100 mg at 05/11/18 0845  . feeding supplement (ENSURE ENLIVE) (ENSURE ENLIVE) liquid 237 mL  237 mL Oral TID BM Damani Rando B, MD      . gabapentin (NEURONTIN) capsule 300 mg  300 mg Oral TID Lupe Bonner B, MD   300 mg at 05/11/18 1226  . hydrOXYzine (ATARAX/VISTARIL) tablet 50 mg  50 mg Oral Q6H PRN [name], [name], MD    50 mg at 05/11/18 1226  . lithium carbonate capsule 450 mg  450 mg Oral BID WC [name], [name], MD   450 mg at 05/11/18 0846  . magnesium hydroxide (MILK OF MAGNESIA) suspension 30 mL  30 mL Oral Daily PRN [name], [name], MD      . paliperidone (INVEGA) 24 hr tablet 9 mg  9 mg Oral Daily Janeah Kovacich B, MD   9 mg at 05/11/18 0845    Lab Results: No results found for this or any previous visit (from the past 48 hour(s)).  Blood Alcohol level:  Lab Results  Component Value Date   ETH <10 05/05/2018   ETH <10 10/12/2017    Metabolic Disorder Labs: Lab Results  Component Value Date   HGBA1C 5.1 05/05/2018   MPG 99.67 05/05/2018   Lab Results  Component Value Date   PROLACTIN 132.8 (H) 10/25/2017   Lab Results  Component Value Date   CHOL 190 05/07/2018   TRIG 112 05/07/2018   HDL 44 05/07/2018   CHOLHDL 4.3 05/07/2018   VLDL 22 05/07/2018   LDLCALC 124 (H) 05/07/2018   LDLCALC 72 10/25/2017    Physical Findings: AIMS: Facial and Oral Movements Muscles of Facial Expression: None, normal Lips and Perioral Area: None, normal Jaw: None, normal Tongue: None, normal,Extremity Movements Upper (arms, wrists, hands, fingers): None, normal Lower (legs, knees, ankles, toes): None, normal, Trunk Movements Neck, shoulders, hips: None, normal, Overall Severity Severity of abnormal movements (highest score from questions above): None, normal Incapacitation due to abnormal movements: None, normal Patient's awareness of abnormal movements (rate only patient's report): No Awareness, Dental Status Current problems with teeth and/or dentures?: No Does patient usually wear dentures?: No  CIWA:    COWS:     Musculoskeletal: Strength & Muscle Tone: within normal limits Gait & Station: normal Patient leans: N/A  Psychiatric Specialty Exam: Physical Exam  Nursing note and vitals reviewed. Psychiatric: Her affect is labile. Her speech is tangential. She is actively  hallucinating. Thought content is paranoid and delusional. Cognition and memory are impaired. She expresses impulsivity.    Review of Systems  Neurological: Negative.   Psychiatric/Behavioral: Positive for hallucinations and substance abuse.  All other systems reviewed and are negative.   Blood pressure 105/72, pulse 70, temperature 97.9 F (36.6 C), temperature source Oral, resp. rate 18, height 5\' 2"  (1.575 m), weight 63.5 kg (140 lb), last menstrual period 04/11/2018, SpO2 100 %.Body mass index is 25.61 kg/m.  General Appearance: Bizarre  Eye Contact:  Good  Speech:  Clear and Coherent  Volume:  Normal  Mood:  Irritable  Affect:  Congruent  Thought Process:  Goal Directed and Descriptions of Associations: Intact  Orientation:  Full (Time, Place, and Person)  Thought Content:  Delusions, Hallucinations: Auditory and Paranoid Ideation  Suicidal Thoughts:  No  Homicidal Thoughts:  No  Memory:  Immediate;   Poor Recent;   Poor Remote;   Poor  Judgement:  Poor  Insight:  Lacking  Psychomotor Activity:  Increased  Concentration:  Concentration: Fair and Attention Span: Fair  Recall:  [employer] of Knowledge:  Fair  Language:  Fair  Akathisia:  No  Handed:  Right  AIMS (if indicated):     Assets:  Communication Skills Desire for Improvement Financial Resources/Insurance Housing Physical Health Resilience Social Support  ADL's:  Intact  Cognition:  WNL  Sleep:  Number of Hours: 8.3     Treatment Plan Summary: Daily contact with patient to assess and evaluate symptoms and progress in treatment and Medication management   Ms. Tschida is a 22 year old female with a history of schizoaffective disorder admitted for auditory hallucinations and paranoia in the context of medication noncompliance.  #Agitation, resolved  #Schizoaffective disorder, bipolar type: -continue Lithium 450mg  BID, Lilevel 0.06 on arrival, Li level in AM -continue Invega 9 mg daily -offer Invega  sustenna injection  #Anxiety -continue Neurontin 300 mg TID -discontinue ativan -offer Vistaril PRN  #Cannabis use disorder -- recommend substance abuse counseling after stabilization  #Smoking cessation -nicotine patch is availabe  #Labs -lipid panel, TSH, A1C are normal -EKG reviewed, NSR with QTc of 420 -pregnancy test is negative  Disposition -discharge to home with her parents -needs follow up with ACT team     Kristine Linea, MD 05/11/2018, 3:27 PM

## 2018-05-11 NOTE — Progress Notes (Signed)
[facility] MD Progress Note  05/12/2018 4:37 PM Kimberly Boyle  MRN:  [id number]  Subjective:   Kimberly Boyle is doing better. She is well groomed, maintains good eye contact and is able to engage. Her thinking however is still disorganized. She is paranoid. She likely attends to internal stimuli but denies any voices on direct questioning.   Li level is subtherapeutic. She refuses invega sustenna.   Principal Problem: Schizoaffective disorder, bipolar type (HCC) Diagnosis:   Patient Active Problem List   Diagnosis Date Noted  . Schizoaffective disorder, bipolar type (HCC) [F25.0] 05/06/2018    Priority: High  . Tobacco use disorder [F17.200] 05/08/2018  . Suicide attempt (HCC) [T14.91XA] 08/20/2017  . Cannabis use disorder, moderate, dependence (HCC) [F12.20] 08/20/2017  . Self-inflicted laceration of wrist [S61.519A] 08/20/2017  . Acetaminophen poisoning [T39.1X1A] 08/20/2017  . Generalized anxiety disorder [F41.1] 01/05/2017  . Mood disorder (HCC) [F39] 01/05/2017   Total Time spent with patient: 20 minutes  Past Psychiatric History: scizophrenia  Past Medical History:  Past Medical History:  Diagnosis Date  . Anxiety   . Arthritis   . Bipolar disorder (HCC)   . Depression   . Major depressive disorder   . Panic disorder   . Schizophrenia (HCC)    History reviewed. No pertinent surgical history. Family History:  Family History  Problem Relation Age of Onset  . Post-traumatic stress disorder Mother   . Depression Mother   . Depression Maternal Uncle   . Cancer Maternal Grandmother    Family Psychiatric  History: depression Social History:  Social History   Substance and Sexual Activity  Alcohol Use No  . Frequency: Never     Social History   Substance and Sexual Activity  Drug Use No  . Types: Marijuana    Social History   Socioeconomic History  . Marital status: Single    Spouse name: Not on file  . Number of children: 0  . Years of education: Not on file   . Highest education level: High school graduate  Occupational History    Comment: unemployed  Social Needs  . Financial resource strain: Not on file  . Food insecurity:    Worry: Not on file    Inability: Not on file  . Transportation needs:    Medical: No    Non-medical: No  Tobacco Use  . Smoking status: Current Every Day Smoker    Packs/day: 0.25    Types: Cigarettes  . Smokeless tobacco: Never Used  Substance and Sexual Activity  . Alcohol use: No    Frequency: Never  . Drug use: No    Types: Marijuana  . Sexual activity: Not Currently  Lifestyle  . Physical activity:    Days per week: 0 days    Minutes per session: 0 min  . Stress: To some extent  Relationships  . Social connections:    Talks on phone: Not on file    Gets together: Not on file    Attends religious service: Never    Active member of club or organization: No    Attends meetings of clubs or organizations: Never    Relationship status: Never married  Other Topics Concern  . Not on file  Social History Narrative   ** Merged History Encounter **       Additional Social History:                         Sleep: Fair  Appetite:  Fair  Current Medications: Current Facility-Administered Medications  Medication Dose Route Frequency Provider Last Rate Last Dose  . acetaminophen (TYLENOL) tablet 650 mg  650 mg Oral Q6H PRN [name], Jackquline DenmarkJohn T, MD   650 mg at 05/07/18 1224  . alum & mag hydroxide-simeth (MAALOX/MYLANTA) 200-200-20 MG/5ML suspension 30 mL  30 mL Oral Q4H PRN [name], [name], MD      . docusate sodium (COLACE) capsule 100 mg  100 mg Oral Daily Marquest Gunkel B, MD   100 mg at 05/12/18 0833  . feeding supplement (ENSURE ENLIVE) (ENSURE ENLIVE) liquid 237 mL  237 mL Oral TID BM Maddoxx Burkitt B, MD   237 mL at 05/12/18 1435  . gabapentin (NEURONTIN) capsule 300 mg  300 mg Oral TID Thaison Kolodziejski B, MD   300 mg at 05/12/18 1602  . hydrOXYzine (ATARAX/VISTARIL) tablet 50  mg  50 mg Oral Q6H PRN [name], Jackquline DenmarkJohn T, MD   50 mg at 05/12/18 1252  . lithium carbonate capsule 600 mg  600 mg Oral BID WC Averi Kilty B, MD   600 mg at 05/12/18 1602  . magnesium hydroxide (MILK OF MAGNESIA) suspension 30 mL  30 mL Oral Daily PRN [name], [name], MD      . paliperidone (INVEGA SUSTENNA) injection 234 mg  234 mg Intramuscular Q28 days Umer Harig B, MD      . paliperidone (INVEGA) 24 hr tablet 9 mg  9 mg Oral Daily Jazsmin Couse B, MD   9 mg at 05/12/18 [id number]    Lab Results:  Results for orders placed or performed during the hospital encounter of 05/06/18 (from the past 48 hour(s))  Lithium level     Status: Abnormal   Collection Time: 05/12/18  7:01 AM  Result Value Ref Range   Lithium Lvl 0.35 (L) 0.60 - 1.20 mmol/L    Comment: Performed at [facility], [address] [place], [place] [id number]    Blood Alcohol level:  Lab Results  Component Value Date   [facility] 05/05/2018   ETH <10 10/12/2017    Metabolic Disorder Labs: Lab Results  Component Value Date   HGBA1C 5.1 05/05/2018   MPG 99.67 05/05/2018   Lab Results  Component Value Date   PROLACTIN 132.8 (H) 10/25/2017   Lab Results  Component Value Date   CHOL 190 05/07/2018   TRIG 112 05/07/2018   HDL 44 05/07/2018   CHOLHDL 4.3 05/07/2018   VLDL 22 05/07/2018   LDLCALC 124 (H) 05/07/2018   LDLCALC 72 10/25/2017    Physical Findings: AIMS: Facial and Oral Movements Muscles of Facial Expression: None, normal Lips and Perioral Area: None, normal Jaw: None, normal Tongue: None, normal,Extremity Movements Upper (arms, wrists, hands, fingers): None, normal Lower (legs, knees, ankles, toes): None, normal, Trunk Movements Neck, shoulders, hips: None, normal, Overall Severity Severity of abnormal movements (highest score from questions above): None, normal Incapacitation due to abnormal movements: None, normal Patient's awareness of abnormal movements (rate only  patient's report): No Awareness, Dental Status Current problems with teeth and/or dentures?: No Does patient usually wear dentures?: No  CIWA:    COWS:     Musculoskeletal: Strength & Muscle Tone: within normal limits Gait & Station: normal Patient leans: N/A  Psychiatric Specialty Exam: Physical Exam  Nursing note and vitals reviewed. Psychiatric: Her speech is normal. Her affect is blunt. She is slowed and withdrawn. Thought content is paranoid and delusional. Cognition and memory are impaired. She expresses impulsivity.  Review of Systems  Neurological: Negative.   Psychiatric/Behavioral: Positive for hallucinations and substance abuse.  All other systems reviewed and are negative.   Blood pressure 101/62, pulse 60, temperature 97.9 F (36.6 C), temperature source Oral, resp. rate 16, height 5\' 2"  (1.575 m), weight 63.5 kg (140 lb), SpO2 99 %.Body mass index is 25.61 kg/m.  General Appearance: Bizarre and Disheveled  Eye Contact:  Fair  Speech:  Clear and Coherent  Volume:  Normal  Mood:  Dysphoric and Irritable  Affect:  Labile  Thought Process:  Disorganized and Descriptions of Associations: Tangential  Orientation:  Full (Time, Place, and Person)  Thought Content:  Delusions, Hallucinations: Auditory and Paranoid Ideation  Suicidal Thoughts:  No  Homicidal Thoughts:  No  Memory:  Immediate;   Poor Recent;   Poor Remote;   Poor  Judgement:  Poor  Insight:  Lacking  Psychomotor Activity:  Psychomotor Retardation  Concentration:  Concentration: Poor and Attention Span: Poor  Recall:  Poor  Fund of Knowledge:  Poor  Language:  Fair  Akathisia:  No  Handed:  Right  AIMS (if indicated):     Assets:  Communication Skills Desire for Improvement Financial Resources/Insurance Housing Physical Health Resilience Social Support  ADL's:  Intact  Cognition:  WNL  Sleep:  Number of Hours: 7     Treatment Plan Summary: Daily contact with patient to assess and  evaluate symptoms and progress in treatment and Medication management   Kimberly Boyle is a 22 year old female with a history of schizoaffective disorder admitted for auditory hallucinations and paranoia in the context of medication noncompliance.  #Agitation, resolved  #Schizoaffective disorder, bipolar type: -increase Lithium to 600 mg BID, Li level 0.35  -continue Invega 9 mg daily -Invega sustenna injection 234 mg monthly  #Anxiety -continueNeurontin 300 mg TID -discontinue ativan -offer Vistaril PRN  #Cannabis use disorder -- recommend substance abuse counseling after stabilization  #Smoking cessation -nicotine patch is availabe  #Labs -lipid panel, TSH, A1Care normal -EKGreviewed, NSR with QTc of 420 -pregnancy test is negative  Disposition -discharge to home with her parents -needs follow up with ACT team    Kristine Linea, MD 05/12/2018, 4:37 PM

## 2018-05-12 LAB — LITHIUM LEVEL: Lithium Lvl: 0.35 mmol/L — ABNORMAL LOW (ref 0.60–1.20)

## 2018-05-12 MED ORDER — LITHIUM CARBONATE 300 MG PO CAPS
600.0000 mg | ORAL_CAPSULE | Freq: Two times a day (BID) | ORAL | Status: DC
  Administered 2018-05-12 – 2018-05-16 (×9): 600 mg via ORAL
  Filled 2018-05-12 (×9): qty 2

## 2018-05-12 MED ORDER — PALIPERIDONE PALMITATE ER 234 MG/1.5ML IM SUSY
234.0000 mg | PREFILLED_SYRINGE | INTRAMUSCULAR | Status: DC
  Administered 2018-05-12: 234 mg via INTRAMUSCULAR
  Filled 2018-05-12: qty 1.5

## 2018-05-12 NOTE — [facility] Group Notes (Signed)
LCSW Group Therapy Note  05/12/2018 1:00pm  Type of Therapy/Topic:  Group Therapy:  Balance in Life  Participation Level:  Did Not Attend  Description of Group:    This group will address the concept of balance and how it feels and looks when one is unbalanced. Patients will be encouraged to process areas in their lives that are out of balance and identify reasons for remaining unbalanced. Facilitators will guide patients in utilizing problem-solving interventions to address and correct the stressor making their life unbalanced. Understanding and applying boundaries will be explored and addressed for obtaining and maintaining a balanced life. Patients will be encouraged to explore ways to assertively make their unbalanced needs known to significant others in their lives, using other group members and facilitator for support and feedback.  Therapeutic Goals: 1. Patient will identify two or more emotions or situations they have that consume much of in their lives. 2. Patient will identify signs/triggers that life has become out of balance:  3. Patient will identify two ways to set boundaries in order to achieve balance in their lives:  4. Patient will demonstrate ability to communicate their needs through discussion and/or role plays  Summary of Patient Progress:  Kimberly Boyle was invited to today's group, but chose not to attend.    Therapeutic Modalities:   Cognitive Behavioral Therapy Solution-Focused Therapy Assertiveness Training  Kimberly Boyle, [place] 05/12/2018 4:41 PM

## 2018-05-12 NOTE — [facility] Group Notes (Signed)
LCSW Group Therapy Note 05/12/2018 9:00 AM  Type of Therapy and Topic:  Group Therapy:  Setting Goals  Participation Level:  Did Not Attend  Description of Group: In this process group, patients discussed using strengths to work toward goals and address challenges.  Patients identified two positive things about themselves and one goal they were working on.  Patients were given the opportunity to share openly and support each other's plan for self-empowerment.  The group discussed the value of gratitude and were encouraged to have a daily reflection of positive characteristics or circumstances.  Patients were encouraged to identify a plan to utilize their strengths to work on current challenges and goals.  Therapeutic Goals 1. Patient will verbalize personal strengths/positive qualities and relate how these can assist with achieving desired personal goals 2. Patients will verbalize affirmation of peers plans for personal change and goal setting 3. Patients will explore the value of gratitude and positive focus as related to successful achievement of goals 4. Patients will verbalize a plan for regular reinforcement of personal positive qualities and circumstances.  Summary of Patient Progress:  Kimberly Boyle was invited to today's group, but chose not to attend.     Therapeutic Modalities Cognitive Behavioral Therapy Motivational Interviewing    Kimberly FrameSonya S Christipher Rieger, LCSW 05/12/2018 1:00 PM

## 2018-05-12 NOTE — Plan of Care (Signed)
  Problem: Education: Goal: Knowledge of [facility] Education information/materials will improve Outcome: Progressing Goal: Mental status will improve Outcome: Progressing   Problem: Coping: Goal: Coping ability will improve Outcome: Progressing Goal: Will verbalize feelings Outcome: Progressing   Problem: Safety: Goal: Ability to remain free from injury will improve Outcome: Progressing   Problem: Self-Concept: Goal: Will verbalize positive feelings about self Outcome: Progressing   Problem: Education: Goal: Utilization of techniques to improve thought processes will improve Outcome: Progressing Goal: Knowledge of the prescribed therapeutic regimen will improve Outcome: Progressing   Problem: Activity: Goal: Interest or engagement in leisure activities will improve Outcome: Progressing Goal: Imbalance in normal sleep/wake cycle will improve Outcome: Progressing   Problem: Coping: Goal: Coping ability will improve Outcome: Progressing Goal: Will verbalize feelings Outcome: Progressing   Problem: Health Behavior/Discharge Planning: Goal: Ability to make decisions will improve Outcome: Progressing Goal: Compliance with therapeutic regimen will improve Outcome: Progressing   Problem: Role Relationship: Goal: Will demonstrate positive changes in social behaviors and relationships Outcome: Progressing   Problem: Safety: Goal: Ability to disclose and discuss suicidal ideas will improve Outcome: Progressing Goal: Ability to identify and utilize support systems that promote safety will improve Outcome: Progressing   Problem: Self-Concept: Goal: Will verbalize positive feelings about self Outcome: Progressing Goal: Level of anxiety will decrease Outcome: Progressing   Problem: Education: Goal: Knowledge of General Education information will improve Description Including pain rating scale, medication(s)/side effects and non-pharmacologic comfort  measures Outcome: Progressing

## 2018-05-12 NOTE — Progress Notes (Signed)
Recreation Therapy Notes  Date: 05/12/2018  Time: 9:30 pm   Location: Craft Room   Behavioral response: N/A   Intervention Topic: Self-care  Discussion/Intervention: Patient did not attend group.   Clinical Observations/Feedback:  Patient did not attend group.   Pesach Frisch LRT/CTRS        Cordero Surette 05/12/2018 10:58 AM

## 2018-05-12 NOTE — Plan of Care (Signed)
Patient is calm and restful, sleep long hours with out any interruptions , voice no concerns not saying much and stays in her room most of the time  , encourage patient  therapeutically to participate in scheduled social activities, denies SI/HI and AVH, compliant with treatment regimen, safety is maintained and 15 minute safety rounds is in progress, no distress   Problem: Education: Goal: Knowledge of [facility] Education information/materials will improve Outcome: Progressing Goal: Mental status will improve Outcome: Progressing   Problem: Coping: Goal: Coping ability will improve Outcome: Progressing Goal: Will verbalize feelings Outcome: Progressing   Problem: Safety: Goal: Ability to remain free from injury will improve Outcome: Progressing   Problem: Self-Concept: Goal: Will verbalize positive feelings about self Outcome: Progressing   Problem: Education: Goal: Utilization of techniques to improve thought processes will improve Outcome: Progressing Goal: Knowledge of the prescribed therapeutic regimen will improve Outcome: Progressing   Problem: Activity: Goal: Interest or engagement in leisure activities will improve Outcome: Progressing Goal: Imbalance in normal sleep/wake cycle will improve Outcome: Progressing   Problem: Coping: Goal: Coping ability will improve Outcome: Progressing Goal: Will verbalize feelings Outcome: Progressing   Problem: Health Behavior/Discharge Planning: Goal: Ability to make decisions will improve Outcome: Progressing Goal: Compliance with therapeutic regimen will improve Outcome: Progressing   Problem: Role Relationship: Goal: Will demonstrate positive changes in social behaviors and relationships Outcome: Progressing   Problem: Safety: Goal: Ability to disclose and discuss suicidal ideas will improve Outcome: Progressing Goal: Ability to identify and utilize support systems that promote safety will improve Outcome:  Progressing   Problem: Self-Concept: Goal: Will verbalize positive feelings about self Outcome: Progressing Goal: Level of anxiety will decrease Outcome: Progressing   Problem: Education: Goal: Knowledge of General Education information will improve Description Including pain rating scale, medication(s)/side effects and non-pharmacologic comfort measures Outcome: Progressing

## 2018-05-12 NOTE — Plan of Care (Signed)
D: Patient denies SI/HI/AVH. Patient is isolative to room. Patient is cooperative with assessment. Patient has no complaints of pain, denies anxiety and depression during assessment. Patient did not complete a self inventory sheet. Patient is states "I am just fine, it's a good day." Patient maintains flat affect with brief smiling during interaction.  A: Patient provided with scheduled medication. Patient's safety is maintained on unit.  R: patient is compliant with medications. Patient has no complaints at this time.    Problem: Safety: Goal: Ability to remain free from injury will improve Outcome: Progressing   Problem: Health Behavior/Discharge Planning: Goal: Compliance with therapeutic regimen will improve Outcome: Progressing   Problem: Safety: Goal: Ability to disclose and discuss suicidal ideas will improve Outcome: Progressing   Problem: Self-Concept: Goal: Will verbalize positive feelings about self Outcome: Progressing

## 2018-05-12 NOTE — [facility] Group Notes (Signed)
[facility] Group Notes:  (Nursing/MHT/Case Management/Adjunct)  Date:  05/12/2018  Time:  12:46 AM  Type of Therapy:  Group Therapy  Participation Level:  Did Not Attend   Mayra NeerJackie L Modest Draeger 05/12/2018, 12:46 AM

## 2018-05-13 NOTE — Progress Notes (Signed)
[facility] MD Progress Note  05/13/2018 4:48 AM Belynda Pagaduan  MRN:  [id number]  Subjective:    Ms. Cocker is still paranoid, delusional and hallucinating but denies all symptoms. Her mood however has been more stable and she is no longer argumentative. She has been compliant with medications including Invega sustenna injections. She will receive second injection next week.  Principal Problem: Schizoaffective disorder, bipolar type (HCC) Diagnosis:   Patient Active Problem List   Diagnosis Date Noted  . Schizoaffective disorder, bipolar type (HCC) [F25.0] 05/06/2018    Priority: High  . Tobacco use disorder [F17.200] 05/08/2018  . Suicide attempt (HCC) [T14.91XA] 08/20/2017  . Cannabis use disorder, moderate, dependence (HCC) [F12.20] 08/20/2017  . Self-inflicted laceration of wrist [S61.519A] 08/20/2017  . Acetaminophen poisoning [T39.1X1A] 08/20/2017  . Generalized anxiety disorder [F41.1] 01/05/2017  . Mood disorder (HCC) [F39] 01/05/2017   Total Time spent with patient: 20 minutes  Past Psychiatric History: schizoaffective disorder  Past Medical History:  Past Medical History:  Diagnosis Date  . Anxiety   . Arthritis   . Bipolar disorder (HCC)   . Depression   . Major depressive disorder   . Panic disorder   . Schizophrenia (HCC)    History reviewed. No pertinent surgical history. Family History:  Family History  Problem Relation Age of Onset  . Post-traumatic stress disorder Mother   . Depression Mother   . Depression Maternal Uncle   . Cancer Maternal Grandmother    Family Psychiatric  History: none Social History:  Social History   Substance and Sexual Activity  Alcohol Use No  . Frequency: Never     Social History   Substance and Sexual Activity  Drug Use No  . Types: Marijuana    Social History   Socioeconomic History  . Marital status: Single    Spouse name: Not on file  . Number of children: 0  . Years of education: Not on file  . Highest  education level: High school graduate  Occupational History    Comment: unemployed  Social Needs  . Financial resource strain: Not on file  . Food insecurity:    Worry: Not on file    Inability: Not on file  . Transportation needs:    Medical: No    Non-medical: No  Tobacco Use  . Smoking status: Current Every Day Smoker    Packs/day: 0.25    Types: Cigarettes  . Smokeless tobacco: Never Used  Substance and Sexual Activity  . Alcohol use: No    Frequency: Never  . Drug use: No    Types: Marijuana  . Sexual activity: Not Currently  Lifestyle  . Physical activity:    Days per week: 0 days    Minutes per session: 0 min  . Stress: To some extent  Relationships  . Social connections:    Talks on phone: Not on file    Gets together: Not on file    Attends religious service: Never    Active member of club or organization: No    Attends meetings of clubs or organizations: Never    Relationship status: Never married  Other Topics Concern  . Not on file  Social History Narrative   ** Merged History Encounter **       Additional Social History:                         Sleep: Fair  Appetite:  Fair  Current Medications: Current Facility-Administered  Medications  Medication Dose Route Frequency Provider Last Rate Last Dose  . acetaminophen (TYLENOL) tablet 650 mg  650 mg Oral Q6H PRN [name], Jackquline DenmarkJohn T, MD   650 mg at 05/07/18 1224  . alum & mag hydroxide-simeth (MAALOX/MYLANTA) 200-200-20 MG/5ML suspension 30 mL  30 mL Oral Q4H PRN [name], [name], MD      . docusate sodium (COLACE) capsule 100 mg  100 mg Oral Daily Pucilowska, [name], MD   100 mg at 05/12/18 0833  . feeding supplement (ENSURE ENLIVE) (ENSURE ENLIVE) liquid 237 mL  237 mL Oral TID BM Pucilowska, [name], MD   237 mL at 05/12/18 2012  . gabapentin (NEURONTIN) capsule 300 mg  300 mg Oral TID Pucilowska, [name], MD   300 mg at 05/12/18 1602  . hydrOXYzine (ATARAX/VISTARIL) tablet 50 mg  50 mg  Oral Q6H PRN [name], Jackquline DenmarkJohn T, MD   50 mg at 05/12/18 2014  . lithium carbonate capsule 600 mg  600 mg Oral BID WC Pucilowska, [name], MD   600 mg at 05/12/18 1602  . magnesium hydroxide (MILK OF MAGNESIA) suspension 30 mL  30 mL Oral Daily PRN [name], [name], MD      . paliperidone (INVEGA SUSTENNA) injection 234 mg  234 mg Intramuscular Q28 days Pucilowska, [name], MD   234 mg at 05/12/18 1737  . paliperidone (INVEGA) 24 hr tablet 9 mg  9 mg Oral Daily Pucilowska, [name], MD   9 mg at 05/12/18 16100833    Lab Results:  Results for orders placed or performed during the hospital encounter of 05/06/18 (from the past 48 hour(s))  Lithium level     Status: Abnormal   Collection Time: 05/12/18  7:01 AM  Result Value Ref Range   Lithium Lvl 0.35 (L) 0.60 - 1.20 mmol/L    Comment: Performed at [facility], [address] [place], [place] [id number]    Blood Alcohol level:  Lab Results  Component Value Date   [facility] <[facility] 05/05/2018   ETH <10 10/12/2017    Metabolic Disorder Labs: Lab Results  Component Value Date   HGBA1C 5.1 05/05/2018   MPG 99.67 05/05/2018   Lab Results  Component Value Date   PROLACTIN 132.8 (H) 10/25/2017   Lab Results  Component Value Date   CHOL 190 05/07/2018   TRIG 112 05/07/2018   HDL 44 05/07/2018   CHOLHDL 4.3 05/07/2018   VLDL 22 05/07/2018   LDLCALC 124 (H) 05/07/2018   LDLCALC 72 10/25/2017    Physical Findings: AIMS: Facial and Oral Movements Muscles of Facial Expression: None, normal Lips and Perioral Area: None, normal Jaw: None, normal Tongue: None, normal,Extremity Movements Upper (arms, wrists, hands, fingers): None, normal Lower (legs, knees, ankles, toes): None, normal, Trunk Movements Neck, shoulders, hips: None, normal, Overall Severity Severity of abnormal movements (highest score from questions above): None, normal Incapacitation due to abnormal movements: None, normal Patient's awareness of abnormal movements  (rate only patient's report): No Awareness, Dental Status Current problems with teeth and/or dentures?: No Does patient usually wear dentures?: No  CIWA:    COWS:     Musculoskeletal: Strength & Muscle Tone: within normal limits Gait & Station: normal Patient leans: N/A  Psychiatric Specialty Exam: Physical Exam  Nursing note and vitals reviewed. Psychiatric: Her affect is blunt. Her speech is delayed. She is slowed, withdrawn and actively hallucinating. Thought content is paranoid and delusional. Cognition and memory are impaired. She expresses impulsivity.  Review of Systems  Neurological: Negative.   Psychiatric/Behavioral: Positive for hallucinations and substance abuse.  All other systems reviewed and are negative.   Blood pressure 101/62, pulse 60, temperature 97.9 F (36.6 C), temperature source Oral, resp. rate 16, height 5\' 2"  (1.575 m), weight 63.5 kg (140 lb), SpO2 99 %.Body mass index is 25.61 kg/m.  General Appearance: Disheveled  Eye Contact:  Good  Speech:  Clear and Coherent and Slow  Volume:  Decreased  Mood:  Anxious and Irritable  Affect:  Flat  Thought Process:  Disorganized and Descriptions of Associations: Intact  Orientation:  Full (Time, Place, and Person)  Thought Content:  Delusions, Hallucinations: Auditory and Paranoid Ideation  Suicidal Thoughts:  No  Homicidal Thoughts:  No  Memory:  Immediate;   Fair Recent;   Fair Remote;   Fair  Judgement:  Poor  Insight:  Lacking  Psychomotor Activity:  Psychomotor Retardation  Concentration:  Concentration: Fair and Attention Span: Fair  Recall:  [employer] of Knowledge:  Fair  Language:  Fair  Akathisia:  No  Handed:  Right  AIMS (if indicated):     Assets:  Communication Skills Desire for Improvement Financial Resources/Insurance Housing Physical Health Resilience Social Support  ADL's:  Intact  Cognition:  WNL  Sleep:  Number of Hours: 7     Treatment Plan Summary: Daily contact  with patient to assess and evaluate symptoms and progress in treatment and Medication management   Ms. Gritz is a 22 year old female with a history of schizoaffective disorder admitted for auditory hallucinations and paranoia in the context of medication noncompliance.  #Schizoaffective disorder -increase Lithium to 600 mg BID, Li level was 0.35  -continueInvega 9 mg daily -Invega sustenna injection 234 mg given on Thursday, 156 mg due next week  #Anxiety -continueNeurontin 300 mg TID -offer Vistaril PRN  #Cannabis use disorder -minimizes problems and declines treatment  #Smoking cessation -nicotine patch is availabe  #Labs -lipid panel, TSH, [facility] normal -EKGreviewed, NSR with QTc of 420 -pregnancy test is negative  #Disposition -discharge to home with her parents -needs follow up with ACT team     Kristine Linea, MD 05/13/2018, 4:48 AM

## 2018-05-13 NOTE — Progress Notes (Signed)
D: Patient denies SI/HI/AVH. Patient verbally contracts for safety. Patient is calm, cooperative and pleasant. Patient is seen in milieu interacting with peers. Patient has complaints are anxiety and constipation today, Prn medications are successful at relief.   A: Patient was assessed by this nurse. Patient was oriented to unit. Patient's safety was maintained on unit. Q x 15 minute observation checks were completed for safety. Patient care plan was reviewed. Patient was offered support and encouragement. Patient was encourage to attend groups, participate in unit activities and continue with plan of care.   R: Patient has no complaints of pain at this time. Patient is receptive to treatment and safety maintained on unit.

## 2018-05-13 NOTE — [facility] Group Notes (Signed)
05/13/2018 1PM  Type of Therapy and Topic:  Group Therapy:  Feelings around Relapse and Recovery  Participation Level:  Did Not Attend   Description of Group:    Patients in this group will discuss emotions they experience before and after a relapse. They will process how experiencing these feelings, or avoidance of experiencing them, relates to having a relapse. Facilitator will guide patients to explore emotions they have related to recovery. Patients will be encouraged to process which emotions are more powerful. They will be guided to discuss the emotional reaction significant others in their lives may have to patients' relapse or recovery. Patients will be assisted in exploring ways to respond to the emotions of others without this contributing to a relapse.  Therapeutic Goals: 1. Patient will identify two or more emotions that lead to a relapse for them 2. Patient will identify two emotions that result when they relapse 3. Patient will identify two emotions related to recovery 4. Patient will demonstrate ability to communicate their needs through discussion and/or role plays   Summary of Patient Progress: Patient was encouraged and invited to attend group. Patient did not attend group. Social worker will continue to encourage group participation in the future.      Therapeutic Modalities:   Cognitive Behavioral Therapy Solution-Focused Therapy Assertiveness Training Relapse Prevention Therapy   Johny ShearsCassandra  Rayhan Groleau, Alexander MtLCSW 05/13/2018 2:31 PM

## 2018-05-13 NOTE — Tx Team (Signed)
Interdisciplinary Treatment and Diagnostic Plan Update  05/13/2018 Time of Session: 10:30am Kimberly Boyle MRN: [id number]  Principal Diagnosis: Schizoaffective disorder, bipolar type [name])  Secondary Diagnoses: Principal Problem:   Schizoaffective disorder, bipolar type (HCC) Active Problems:   Cannabis use disorder, moderate, dependence (HCC)   Tobacco use disorder   Current Medications:  Current Facility-Administered Medications  Medication Dose Route Frequency Provider Last Rate Last Dose  . acetaminophen (TYLENOL) tablet 650 mg  650 mg Oral Q6H PRN [name], [name], MD   650 mg at 05/07/18 1224  . alum & mag hydroxide-simeth (MAALOX/MYLANTA) 200-200-20 MG/5ML suspension 30 mL  30 mL Oral Q4H PRN [name], [name], MD      . docusate sodium (COLACE) capsule 100 mg  100 mg Oral Daily Pucilowska, [name], MD   100 mg at 05/13/18 0748  . feeding supplement (ENSURE ENLIVE) (ENSURE ENLIVE) liquid 237 mL  237 mL Oral TID BM Pucilowska, [name], MD   237 mL at 05/13/18 1356  . gabapentin (NEURONTIN) capsule 300 mg  300 mg Oral TID Pucilowska, [name], MD   300 mg at 05/13/18 1138  . hydrOXYzine (ATARAX/VISTARIL) tablet 50 mg  50 mg Oral Q6H PRN [name], [name], MD   50 mg at 05/12/18 2014  . lithium carbonate capsule 600 mg  600 mg Oral BID WC Pucilowska, [name], MD   600 mg at 05/13/18 0748  . magnesium hydroxide (MILK OF MAGNESIA) suspension 30 mL  30 mL Oral Daily PRN [name], [name], MD      . paliperidone (INVEGA SUSTENNA) injection 234 mg  234 mg Intramuscular Q28 days Pucilowska, [name], MD   234 mg at 05/12/18 1737  . paliperidone (INVEGA) 24 hr tablet 9 mg  9 mg Oral Daily Pucilowska, [name], MD   9 mg at 05/13/18 0748   PTA Medications: Medications Prior to Admission  Medication Sig Dispense Refill Last Dose  . benztropine (COGENTIN) 0.5 MG tablet Take 1 tablet (0.5 mg total) by mouth 2 (two) times daily. 60 tablet 1 unknown at unknown  . fluPHENAZine (PROLIXIN)  10 MG tablet Take 20 mg by mouth at bedtime.  0 unknown at unknown  . fluPHENAZine (PROLIXIN) 5 MG tablet Take 5 mg by mouth 2 (two) times daily.  0 unknown at unknown  . hydrOXYzine (ATARAX/VISTARIL) 25 MG tablet Take 1 tablet (25 mg total) by mouth every 8 (eight) hours as needed for anxiety. (Patient not taking: Reported on 05/05/2018) 90 tablet 1 Not Taking at Unknown time  . lithium carbonate (ESKALITH) 450 MG CR tablet Take 1 tablet (450 mg total) by mouth 2 (two) times daily. 60 tablet 1 unknown at unknown  . risperiDONE (RISPERDAL) 2 MG tablet Take 0.5 tablets (1 mg total) by mouth daily. 15 tablet 1 unknown at unknown  . risperiDONE microspheres (RISPERDAL CONSTA) 25 MG injection Inject 2 mLs (25 mg total) into the muscle every 14 (fourteen) days. First dose 12/17/2017 (Patient not taking: Reported on 05/05/2018) 1 each 1 Not Taking  . 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   Physician Treatment Plan for Primary Diagnosis: Schizoaffective disorder, bipolar type (HCC) Long Term Goal(s): Improvement in symptoms so as ready  for discharge Improvement in symptoms so as ready for discharge   Short Term Goals: Ability to identify changes in lifestyle to reduce recurrence of condition will improve Ability to identify changes in lifestyle to reduce recurrence of condition will improve  Medication Management: Evaluate patient's response, side effects, and tolerance of medication regimen.  Therapeutic Interventions: 1 to 1 sessions, Unit Group sessions and Medication administration.  Evaluation of Outcomes: Progressing  Physician Treatment Plan  for Secondary Diagnosis: Principal Problem:   Schizoaffective disorder, bipolar type (HCC) Active Problems:   Cannabis use disorder, moderate, dependence (HCC)   Tobacco use disorder  Long Term Goal(s): Improvement in symptoms so as ready for discharge Improvement in symptoms so as ready for discharge   Short Term Goals: Ability to identify changes in lifestyle to reduce recurrence of condition will improve Ability to identify changes in lifestyle to reduce recurrence of condition will improve     Medication Management: Evaluate patient's response, side effects, and tolerance of medication regimen.  Therapeutic Interventions: 1 to 1 sessions, Unit Group sessions and Medication administration.  Evaluation of Outcomes: Progressing   RN Treatment Plan for Primary Diagnosis: Schizoaffective disorder, bipolar type (HCC) Long Term Goal(s): Knowledge of disease and therapeutic regimen to maintain health will improve  Short Term Goals: Ability to identify and develop effective coping behaviors will improve and Compliance with prescribed medications will improve  Medication Management: RN will administer medications as ordered by provider, will assess and evaluate patient's response and provide education to patient for prescribed medication. RN will report any adverse and/or side effects to prescribing provider.  Therapeutic Interventions: 1 on 1 counseling sessions, Psychoeducation, Medication administration, Evaluate responses to treatment, Monitor vital signs and CBGs as ordered, Perform/monitor CIWA, COWS, AIMS and Fall Risk screenings as ordered, Perform wound care treatments as ordered.  Evaluation of Outcomes: Progressing   LCSW Treatment Plan for Primary Diagnosis: Schizoaffective disorder, bipolar type (HCC) Long Term Goal(s): Safe transition to appropriate next level of care at discharge, Engage patient in therapeutic group addressing interpersonal concerns.  Short Term Goals:  Engage patient in aftercare planning with referrals and resources, Identify triggers associated with mental health/substance abuse issues and Increase skills for wellness and recovery  Therapeutic Interventions: Assess for all discharge needs, 1 to 1 time with Social worker, Explore available resources and support systems, Assess for adequacy in community support network, Educate family and significant other(s) on suicide prevention, Complete Psychosocial Assessment, Interpersonal group therapy.  Evaluation of Outcomes: Progressing   Progress in Treatment: Attending groups: No. Participating in groups: No. Taking medication as prescribed: Yes. Toleration medication: Yes. Family/Significant other contact made: Yes, individual(s) contacted:  Pt's mother, Calypso Hagarty, has been contacted. Patient understands diagnosis: Yes. Discussing patient identified problems/goals with staff: Yes. Medical problems stabilized or resolved: No. Denies suicidal/homicidal ideation: No. Issues/concerns per patient self-inventory: No. Other:    New problem(s) identified: No, Describe:     New Short Term/Long Term Goal(s):  Patient Goals:  To feel safe.   Discharge Plan or Barriers: Discharge plan continues to be that pt will likely return home with her parents and follow up with outpatient provider in [place], [place].  Reason for Continuation of Hospitalization: Delusions  Hallucinations Medication stabilization  Estimated Length of Stay: 5-7 days    Attendees: Patient: 05/13/2018 2:29 PM  Physician: Kristine Linea, MD 05/13/2018 2:29 PM  Nursing: Horton Marshall, RN 05/13/2018 2:29 PM  RN Care Manager: 05/13/2018 2:29 PM  Social Worker: Huey Romans, LCSW 05/13/2018 2:29 PM  Recreational Therapist:  05/13/2018 2:29 PM  Other: Johny Shearsassandra Jarrett, LCSWA 05/13/2018 2:29 PM  Other:  05/13/2018 2:29 PM  Other: 05/13/2018 2:29 PM    Scribe for Treatment Team: Alease FrameSonya S Olean Sangster, LCSW 05/13/2018 2:29 PM

## 2018-05-13 NOTE — Progress Notes (Signed)
Nursing note 7p-7a  Pt observed interacting with peers on unit this shift. Displayed a flat affect and anxious mood upon interaction with this Clinical research associatewriter. Pt denies pain ,denies SI/HI, and also denies any audio or visual hallucinations at this time but observed responding to internal stimuli and talking to wall in room. Pt complained of feeling anxious prn anxiety medication administered per MD order. See prn medication administration record. Pt is able to verbally contract for safety with this RN.  Pt is now resting in bed with eyes closed, with no signs or symptoms of pain or distress noted. Pt continues to remain safe on the unit and is observed by rounding every 15 min. RN will continue to monitor.

## 2018-05-14 MED ORDER — NICOTINE 21 MG/24HR TD PT24
21.0000 mg | MEDICATED_PATCH | Freq: Every day | TRANSDERMAL | Status: DC
  Administered 2018-05-14 – 2018-05-22 (×7): 21 mg via TRANSDERMAL
  Filled 2018-05-14 (×10): qty 1

## 2018-05-14 NOTE — Plan of Care (Signed)
Patient is  sleeping long hours and isolating self in her room, minimal interactions with peers,  refusing to attend groups, but compliant with her medications, support and encouragement is accorded to patient, contract for safety, denies SI/HI, patient appear to be responding to internal stimuli, talks to the wall and stir strangely, 15 minute checks  is maintained, no distress.   Problem: Education: Goal: Knowledge of [facility] Education information/materials will improve Outcome: Progressing Goal: Mental status will improve Outcome: Progressing   Problem: Coping: Goal: Coping ability will improve Outcome: Progressing Goal: Will verbalize feelings Outcome: Progressing   Problem: Safety: Goal: Ability to remain free from injury will improve Outcome: Progressing   Problem: Self-Concept: Goal: Will verbalize positive feelings about self Outcome: Progressing   Problem: Education: Goal: Utilization of techniques to improve thought processes will improve Outcome: Progressing Goal: Knowledge of the prescribed therapeutic regimen will improve Outcome: Progressing   Problem: Activity: Goal: Interest or engagement in leisure activities will improve Outcome: Progressing Goal: Imbalance in normal sleep/wake cycle will improve Outcome: Progressing   Problem: Coping: Goal: Coping ability will improve Outcome: Progressing Goal: Will verbalize feelings Outcome: Progressing   Problem: Health Behavior/Discharge Planning: Goal: Ability to make decisions will improve Outcome: Progressing Goal: Compliance with therapeutic regimen will improve Outcome: Progressing   Problem: Role Relationship: Goal: Will demonstrate positive changes in social behaviors and relationships Outcome: Progressing   Problem: Safety: Goal: Ability to disclose and discuss suicidal ideas will improve Outcome: Progressing Goal: Ability to identify and utilize support systems that promote safety will  improve Outcome: Progressing   Problem: Self-Concept: Goal: Will verbalize positive feelings about self Outcome: Progressing Goal: Level of anxiety will decrease Outcome: Progressing   Problem: Education: Goal: Knowledge of General Education information will improve Description Including pain rating scale, medication(s)/side effects and non-pharmacologic comfort measures Outcome: Progressing
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
## 2018-05-14 NOTE — Progress Notes (Signed)
Patient visible in the milieu. Frequently presenting to the nurses station. Requesting medications. Appears to be talking to herself when she is alone. Patient wanting to stay with this writer all the time "can I stay with you ? Do you have to go ? ". Patient is otherwise compliant with treatment but experiencing difficulty programing in groups. Encouragements provided and safety precautions reinforced.

## 2018-05-14 NOTE — Progress Notes (Addendum)
D: Patient denies SI/HI/AVH. Patient verbally contracts for safety. Patient is calm, cooperative and pleasant. Patient has no complaints at this time. Patient denies any internal stimuli and is not witnessed interaction. Patient is isolative to room, is out for meals and for requests of PRN medications. Patient is seen in briefly in milieu interacting with peers, but quickly returns to room. Patient did not complete a self-inventory sheet today.   A: Patient was assessed by this nurse. Patient was oriented to unit. Patient's safety was maintained on unit. Q x 15 minute observation checks were completed for safety. Patient care plan was reviewed. Patient was offered support and encouragement. Patient was encourage to attend groups, participate in unit activities and continue with plan of care.   R: Patient has no complaints of pain at this time. Patient is receptive to treatment and safety maintained on unit.

## 2018-05-14 NOTE — [facility] Group Notes (Signed)
LCSW Group Therapy Note  05/14/2018 1:15pm  Type of Therapy and Topic: Group Therapy: Holding on to Grudges   Participation Level: Did Not Attend   Description of Group:  In this group patients will be asked to explore and define a grudge. Patients will be guided to discuss their thoughts, feelings, and reasons as to why people have grudges. Patients will process the impact grudges have on daily life and identify thoughts and feelings related to holding grudges. Facilitator will challenge patients to identify ways to let go of grudges and the benefits this provides. Patients will be confronted to address why one struggles letting go of grudges. Lastly, patients will identify feelings and thoughts related to what life would look like without grudges. This group will be process-oriented, with patients participating in exploration of their own experiences, giving and receiving support, and processing challenge from other group members.  Therapeutic Goals:  1. Patient will identify specific grudges related to their personal life.  2. Patient will identify feelings, thoughts, and beliefs around grudges.  3. Patient will identify how one releases grudges appropriately.  4. Patient will identify situations where they could have let go of the grudge, but instead chose to hold on.   Summary of Patient Progress: Pt was invited to attend group but chose not to attend. CSW will continue to encourage pt to attend group throughout their admission.    Therapeutic Modalities:  Cognitive Behavioral Therapy  Solution Focused Therapy  Motivational Interviewing  Brief Therapy   Kimberly Boyle  CUEBAS-COLON, LCSW 05/14/2018 12:45 PM  

## 2018-05-14 NOTE — Plan of Care (Signed)
In the milieu, anxious but able to express feelings and needs. Compliant with medications

## 2018-05-15 NOTE — [facility] Group Notes (Signed)
LCSW Group Therapy Note 05/15/2018 1:15pm  Type of Therapy and Topic: Group Therapy: Feelings Around Returning Home & Establishing a Supportive Framework and Supporting Oneself When Supports Not Available  Participation Level: Did Not Attend  Description of Group:  Patients first processed thoughts and feelings about upcoming discharge. These included fears of upcoming changes, lack of change, new living environments, judgements and expectations from others and overall stigma of mental health issues. The group then discussed the definition of a supportive framework, what that looks and feels like, and how do to discern it from an unhealthy non-supportive network. The group identified different types of supports as well as what to do when your family/friends are less than helpful or unavailable  Therapeutic Goals  1. Patient will identify one healthy supportive network that they can use at discharge. 2. Patient will identify one factor of a supportive framework and how to tell it from an unhealthy network. 3. Patient able to identify one coping skill to use when they do not have positive supports from others. 4. Patient will demonstrate ability to communicate their needs through discussion and/or role plays.  Summary of Patient Progress:  Pt was invited to attend group but chose not to attend. CSW will continue to encourage pt to attend group throughout their admission.   Therapeutic Modalities Cognitive Behavioral Therapy Motivational Interviewing   Elishah Ashmore  CUEBAS-COLON, LCSW 05/15/2018 12:36 PM 

## 2018-05-15 NOTE — Progress Notes (Signed)
[facility] MD Progress Note  05/15/2018 2:37 PM Kimberly Boyle  MRN:  [id number] Subjective: Continued follow-up patient was schizoaffective disorder chronic psychosis.  Still paranoid and psychotic.  Sleeping better.  Appreciated the nicotine patch. Principal Problem: Schizoaffective disorder, bipolar type (HCC) Diagnosis:   Patient Active Problem List   Diagnosis Date Noted  . Tobacco use disorder [F17.200] 05/08/2018  . Schizoaffective disorder, bipolar type (HCC) [F25.0] 05/06/2018  . Suicide attempt (HCC) [T14.91XA] 08/20/2017  . Cannabis use disorder, moderate, dependence (HCC) [F12.20] 08/20/2017  . Self-inflicted laceration of wrist [S61.519A] 08/20/2017  . Acetaminophen poisoning [T39.1X1A] 08/20/2017  . Generalized anxiety disorder [F41.1] 01/05/2017  . Mood disorder (HCC) [F39] 01/05/2017   Total Time spent with patient: 15 minutes  Past Psychiatric History: History of schizophrenia  Past Medical History:  Past Medical History:  Diagnosis Date  . Anxiety   . Arthritis   . Bipolar disorder (HCC)   . Depression   . Major depressive disorder   . Panic disorder   . Schizophrenia (HCC)    History reviewed. No pertinent surgical history. Family History:  Family History  Problem Relation Age of Onset  . Post-traumatic stress disorder Mother   . Depression Mother   . Depression Maternal Uncle   . Cancer Maternal Grandmother    Family Psychiatric  History: See previous Social History:  Social History   Substance and Sexual Activity  Alcohol Use No  . Frequency: Never     Social History   Substance and Sexual Activity  Drug Use No  . Types: Marijuana    Social History   Socioeconomic History  . Marital status: Single    Spouse name: Not on file  . Number of children: 0  . Years of education: Not on file  . Highest education level: High school graduate  Occupational History    Comment: unemployed  Social Needs  . Financial resource strain: Not on file  .  Food insecurity:    Worry: Not on file    Inability: Not on file  . Transportation needs:    Medical: No    Non-medical: No  Tobacco Use  . Smoking status: Current Every Day Smoker    Packs/day: 0.25    Types: Cigarettes  . Smokeless tobacco: Never Used  Substance and Sexual Activity  . Alcohol use: No    Frequency: Never  . Drug use: No    Types: Marijuana  . Sexual activity: Not Currently  Lifestyle  . Physical activity:    Days per week: 0 days    Minutes per session: 0 min  . Stress: To some extent  Relationships  . Social connections:    Talks on phone: Not on file    Gets together: Not on file    Attends religious service: Never    Active member of club or organization: No    Attends meetings of clubs or organizations: Never    Relationship status: Never married  Other Topics Concern  . Not on file  Social History Narrative   ** Merged History Encounter **       Additional Social History:                         Sleep: Good  Appetite:  Good  Current Medications: Current Facility-Administered Medications  Medication Dose Route Frequency Provider Last Rate Last Dose  . acetaminophen (TYLENOL) tablet 650 mg  650 mg Oral Q6H PRN [name], [name], MD  650 mg at 05/07/18 1224  . alum & mag hydroxide-simeth (MAALOX/MYLANTA) 200-200-20 MG/5ML suspension 30 mL  30 mL Oral Q4H PRN [name], [name], MD      . docusate sodium (COLACE) capsule 100 mg  100 mg Oral Daily Pucilowska, [name], MD   100 mg at 05/15/18 0900  . feeding supplement (ENSURE ENLIVE) (ENSURE ENLIVE) liquid 237 mL  237 mL Oral TID BM Pucilowska, [name], MD   237 mL at 05/15/18 1100  . gabapentin (NEURONTIN) capsule 300 mg  300 mg Oral TID Pucilowska, [name], MD   300 mg at 05/15/18 1201  . hydrOXYzine (ATARAX/VISTARIL) tablet 50 mg  50 mg Oral Q6H PRN [name], Jackquline DenmarkJohn T, MD   50 mg at 05/15/18 1430  . lithium carbonate capsule 600 mg  600 mg Oral BID WC Pucilowska, [name], MD   600 mg  at 05/15/18 0900  . magnesium hydroxide (MILK OF MAGNESIA) suspension 30 mL  30 mL Oral Daily PRN [name], [name], MD      . nicotine (NICODERM CQ - dosed in mg/24 hours) patch 21 mg  21 mg Transdermal Daily [name], Jackquline DenmarkJohn T, MD   21 mg at 05/15/18 0901  . paliperidone (INVEGA SUSTENNA) injection 234 mg  234 mg Intramuscular Q28 days Pucilowska, [name], MD   234 mg at 05/12/18 1737  . paliperidone (INVEGA) 24 hr tablet 9 mg  9 mg Oral Daily Pucilowska, [name], MD   9 mg at 05/15/18 0900    Lab Results: No results found for this or any previous visit (from the past 48 hour(s)).  Blood Alcohol level:  Lab Results  Component Value Date   ETH <10 05/05/2018   ETH <10 10/12/2017    Metabolic Disorder Labs: Lab Results  Component Value Date   HGBA1C 5.1 05/05/2018   MPG 99.67 05/05/2018   Lab Results  Component Value Date   PROLACTIN 132.8 (H) 10/25/2017   Lab Results  Component Value Date   CHOL 190 05/07/2018   TRIG 112 05/07/2018   HDL 44 05/07/2018   CHOLHDL 4.3 05/07/2018   VLDL 22 05/07/2018   LDLCALC 124 (H) 05/07/2018   LDLCALC 72 10/25/2017    Physical Findings: AIMS: Facial and Oral Movements Muscles of Facial Expression: None, normal Lips and Perioral Area: None, normal Jaw: None, normal Tongue: None, normal,Extremity Movements Upper (arms, wrists, hands, fingers): None, normal Lower (legs, knees, ankles, toes): None, normal, Trunk Movements Neck, shoulders, hips: None, normal, Overall Severity Severity of abnormal movements (highest score from questions above): None, normal Incapacitation due to abnormal movements: None, normal Patient's awareness of abnormal movements (rate only patient's report): No Awareness, Dental Status Current problems with teeth and/or dentures?: No Does patient usually wear dentures?: No  CIWA:    COWS:     Musculoskeletal: Strength & Muscle Tone: within normal limits Gait & Station: normal Patient leans: N/A  Psychiatric  Specialty Exam: Physical Exam  Nursing note and vitals reviewed. Constitutional: She appears well-developed and well-nourished.  HENT:  Head: Normocephalic and atraumatic.  Eyes: Pupils are equal, round, and reactive to light. Conjunctivae are normal.  Neck: Normal range of motion.  Cardiovascular: Regular rhythm and normal heart sounds.  Respiratory: Effort normal. No respiratory distress.  GI: Soft.  Musculoskeletal: Normal range of motion.  Neurological: She is alert.  Skin: Skin is warm and dry.  Psychiatric: Judgment normal. Her affect is blunt. Her speech is delayed. She is slowed. Thought content is paranoid and  delusional. Cognition and memory are impaired.    Review of Systems  Constitutional: Negative.   HENT: Negative.   Eyes: Negative.   Respiratory: Negative.   Cardiovascular: Negative.   Gastrointestinal: Negative.   Musculoskeletal: Negative.   Skin: Negative.   Neurological: Negative.   Psychiatric/Behavioral: Negative for hallucinations, substance abuse and suicidal ideas. The patient is nervous/anxious.     Blood pressure 110/75, pulse (!) 108, temperature 98.3 F (36.8 C), temperature source Oral, resp. rate 18, height 5\' 2"  (1.575 m), weight 63.5 kg (140 lb), SpO2 100 %.Body mass index is 25.61 kg/m.  General Appearance: Casual  Eye Contact:  Good  Speech:  Clear and Coherent  Volume:  Decreased  Mood:  Depressed  Affect:  Congruent  Thought Process:  Goal Directed  Orientation:  Full (Time, Place, and Person)  Thought Content:  Illogical and Paranoid Ideation  Suicidal Thoughts:  No  Homicidal Thoughts:  No  Memory:  Immediate;   Fair Recent;   Fair Remote;   Fair  Judgement:  Fair  Insight:  Fair  Psychomotor Activity:  Decreased  Concentration:  Concentration: Fair  Recall:  [employer] of Knowledge:  Fair  Language:  Fair  Akathisia:  No  Handed:  Right  AIMS (if indicated):     Assets:  Desire for Improvement Housing Physical Health   ADL's:  Intact  Cognition:  Impaired,  Mild  Sleep:  Number of Hours: 4.5     Treatment Plan Summary: Daily contact with patient to assess and evaluate symptoms and progress in treatment, Medication management and Plan Continue encouraging patient to participate in groups get out of her room improve her hygiene.  No change to antipsychotic medication plan.  Mordecai Rasmussen, MD 05/15/2018, 2:37 PM

## 2018-05-15 NOTE — Plan of Care (Signed)
Patient verbalizes understanding of the general information that's been provided to her as well as her prescribed therapeutic regimen and all questions/concerns have been addressed and answered at this time. Patient denies SI/HI/AVH as well as depression at this time. Patient rates her anxiety a "5/10" stating that she's been anxious for the "past few days, there's a scary man threatening me, that's why I'm here". Patient had no stated goals for today and she has remained free from injury thus far on the unit. Patient has the ability to utilize techniques to improve his thought processes and she has shown some interest in leisure activities. Patient has been present in the milieu throughout the afternoon. Patient has the ability to make decisions regarding her health-care and has been in compliance with her therapeutic regimen. Patient denies SI/HI/AVH and has demonstrated positive behaviors. Patient remains safe on the unit at this time.  Problem: Education: Goal: Knowledge of [facility] General Education information/materials will improve Outcome: Progressing Goal: Mental status will improve Outcome: Progressing   Problem: Coping: Goal: Coping ability will improve Outcome: Progressing Goal: Will verbalize feelings Outcome: Progressing   Problem: Safety: Goal: Ability to remain free from injury will improve Outcome: Progressing   Problem: Self-Concept: Goal: Will verbalize positive feelings about self Outcome: Progressing   Problem: Education: Goal: Utilization of techniques to improve thought processes will improve Outcome: Progressing Goal: Knowledge of the prescribed therapeutic regimen will improve Outcome: Progressing   Problem: Activity: Goal: Interest or engagement in leisure activities will improve Outcome: Progressing Goal: Imbalance in normal sleep/wake cycle will improve Outcome: Progressing   Problem: Coping: Goal: Coping ability will improve Outcome:  Progressing Goal: Will verbalize feelings Outcome: Progressing   Problem: Health Behavior/Discharge Planning: Goal: Ability to make decisions will improve Outcome: Progressing Goal: Compliance with therapeutic regimen will improve Outcome: Progressing   Problem: Role Relationship: Goal: Will demonstrate positive changes in social behaviors and relationships Outcome: Progressing   Problem: Safety: Goal: Ability to disclose and discuss suicidal ideas will improve Outcome: Progressing Goal: Ability to identify and utilize support systems that promote safety will improve Outcome: Progressing   Problem: Self-Concept: Goal: Will verbalize positive feelings about self Outcome: Progressing Goal: Level of anxiety will decrease Outcome: Progressing   Problem: Education: Goal: Knowledge of General Education information will improve Description Including pain rating scale, medication(s)/side effects and non-pharmacologic comfort measures Outcome: Progressing

## 2018-05-15 NOTE — Progress Notes (Signed)
D- Patient alert and oriented. Patient presents in a pleasant mood on assessment stating that slept "fine" last night and had no major complaints to voice to this Clinical research associatewriter. Patient rates her anxiety a "5/10" stating that she's been feeling this way for the "past few days" and that she's anxious because "scary man is threatening me, that's why I'm in here". Patient denies SI, HI, AVH, and pain at this time. Patient also denies any signs/symptoms of depression. Patient has no stated goals for today.  A- Scheduled medications administered to patient, per MD orders. Support and encouragement provided.  Routine safety checks conducted every 15 minutes.  Patient informed to notify staff with problems or concerns.  R- No adverse drug reactions noted. Patient contracts for safety at this time. Patient compliant with medications and treatment plan. Patient receptive, calm, and cooperative. Patient interacts well with others on the unit.  Patient remains safe at this time.

## 2018-05-15 NOTE — [facility] Group Notes (Signed)
[facility] Group Notes:  (Nursing/MHT/Case Management/Adjunct)  Date:  05/15/2018  Time:  10:09 PM  Type of Therapy:  Group Therapy  Participation Level:  Did Not Attend   Summary of Progress/Problems:  Kimberly NeighborsKeith D Philippa Boyle 05/15/2018, 10:09 PM

## 2018-05-15 NOTE — Progress Notes (Signed)
[facility] MD Progress Note  05/16/2018 10:16 AM Kimberly Boyle  MRN:  [id number]  Subjective:    Kimberly Boyle is in bed with curtains closed. She is ready to go home. She denies any symptoms of depression, anxiety or psychosis. She specifically and staunchly denies auditory hallucinations but according to nursing notes she has been attending to internal stimuli and still paranoid about "Black cop from [place] trying to hurt her". She tolerates medications well. There are no somatic complaints. She does not attend any groups or socialize.  Spoke with the mother who visited over the weekend. She noticed that Kimberly Boyle is still immersed in her "inner world" listening to the voices all the time and that she has to "pull her away" from the voices. The patient hallucinates all the time, according to the mother. The patient will be staying with her parents following discharge and they are ready to petition for guardianship and start disability application process.   The mother reports that the patient does better when allowed to take Clonazepam, staying calemer, less restless and more social.   Principal Problem: Schizoaffective disorder, bipolar type (HCC) Diagnosis:   Patient Active Problem List   Diagnosis Date Noted  . Schizoaffective disorder, bipolar type (HCC) [F25.0] 05/06/2018    Priority: High  . Tobacco use disorder [F17.200] 05/08/2018  . Suicide attempt (HCC) [T14.91XA] 08/20/2017  . Cannabis use disorder, moderate, dependence (HCC) [F12.20] 08/20/2017  . Self-inflicted laceration of wrist [S61.519A] 08/20/2017  . Acetaminophen poisoning [T39.1X1A] 08/20/2017  . Generalized anxiety disorder [F41.1] 01/05/2017  . Mood disorder (HCC) [F39] 01/05/2017   Total Time spent with patient: 20 minutes  Past Psychiatric History: bipolar disorder  Past Medical History:  Past Medical History:  Diagnosis Date  . Anxiety   . Arthritis   . Bipolar disorder (HCC)   . Depression   . Major  depressive disorder   . Panic disorder   . Schizophrenia (HCC)    History reviewed. No pertinent surgical history. Family History:  Family History  Problem Relation Age of Onset  . Post-traumatic stress disorder Mother   . Depression Mother   . Depression Maternal Uncle   . Cancer Maternal Grandmother    Family Psychiatric  History: none Social History:  Social History   Substance and Sexual Activity  Alcohol Use No  . Frequency: Never     Social History   Substance and Sexual Activity  Drug Use No  . Types: Marijuana    Social History   Socioeconomic History  . Marital status: Single    Spouse name: Not on file  . Number of children: 0  . Years of education: Not on file  . Highest education level: High school graduate  Occupational History    Comment: unemployed  Social Needs  . Financial resource strain: Not on file  . Food insecurity:    Worry: Not on file    Inability: Not on file  . Transportation needs:    Medical: No    Non-medical: No  Tobacco Use  . Smoking status: Current Every Day Smoker    Packs/day: 0.25    Types: Cigarettes  . Smokeless tobacco: Never Used  Substance and Sexual Activity  . Alcohol use: No    Frequency: Never  . Drug use: No    Types: Marijuana  . Sexual activity: Not Currently  Lifestyle  . Physical activity:    Days per week: 0 days    Minutes per session: 0 min  . Stress:  To some extent  Relationships  . Social connections:    Talks on phone: Not on file    Gets together: Not on file    Attends religious service: Never    Active member of club or organization: No    Attends meetings of clubs or organizations: Never    Relationship status: Never married  Other Topics Concern  . Not on file  Social History Narrative   ** Merged History Encounter **       Additional Social History:                         Sleep: Fair  Appetite:  Fair  Current Medications: Current Facility-Administered Medications   Medication Dose Route Frequency Provider Last Rate Last Dose  . acetaminophen (TYLENOL) tablet 650 mg  650 mg Oral Q6H PRN [name], [name], MD   650 mg at 05/07/18 1224  . alum & mag hydroxide-simeth (MAALOX/MYLANTA) 200-200-20 MG/5ML suspension 30 mL  30 mL Oral Q4H PRN [name], [name], MD      . docusate sodium (COLACE) capsule 100 mg  100 mg Oral Daily Candelaria Pies B, MD   100 mg at 05/16/18 0913  . feeding supplement (ENSURE ENLIVE) (ENSURE ENLIVE) liquid 237 mL  237 mL Oral TID BM Azaya Goedde B, MD   237 mL at 05/15/18 1946  . gabapentin (NEURONTIN) capsule 300 mg  300 mg Oral TID Dariel Pellecchia B, MD   300 mg at 05/16/18 0912  . hydrOXYzine (ATARAX/VISTARIL) tablet 50 mg  50 mg Oral Q6H PRN [name], [name], MD   50 mg at 05/15/18 1430  . lithium carbonate capsule 600 mg  600 mg Oral BID WC Nargis Abrams B, MD   600 mg at 05/16/18 0913  . magnesium hydroxide (MILK OF MAGNESIA) suspension 30 mL  30 mL Oral Daily PRN [name], [name], MD      . nicotine (NICODERM CQ - dosed in mg/24 hours) patch 21 mg  21 mg Transdermal Daily [name], [name], MD   21 mg at 05/16/18 1610  . paliperidone (INVEGA SUSTENNA) injection 234 mg  234 mg Intramuscular Q28 days Lekha Dancer B, MD   234 mg at 05/12/18 1737  . paliperidone (INVEGA) 24 hr tablet 9 mg  9 mg Oral Daily Calene Paradiso B, MD   9 mg at 05/16/18 9604    Lab Results: No results found for this or any previous visit (from the past 48 hour(s)).  Blood Alcohol level:  Lab Results  Component Value Date   ETH <10 05/05/2018   ETH <10 10/12/2017    Metabolic Disorder Labs: Lab Results  Component Value Date   HGBA1C 5.1 05/05/2018   MPG 99.67 05/05/2018   Lab Results  Component Value Date   PROLACTIN 132.8 (H) 10/25/2017   Lab Results  Component Value Date   CHOL 190 05/07/2018   TRIG 112 05/07/2018   HDL 44 05/07/2018   CHOLHDL 4.3 05/07/2018   VLDL 22 05/07/2018   LDLCALC 124 (H) 05/07/2018    LDLCALC 72 10/25/2017    Physical Findings: AIMS: Facial and Oral Movements Muscles of Facial Expression: None, normal Lips and Perioral Area: None, normal Jaw: None, normal Tongue: None, normal,Extremity Movements Upper (arms, wrists, hands, fingers): None, normal Lower (legs, knees, ankles, toes): None, normal, Trunk Movements Neck, shoulders, hips: None, normal, Overall Severity Severity of abnormal movements (highest score from questions above): None, normal Incapacitation due to abnormal  movements: None, normal Patient's awareness of abnormal movements (rate only patient's report): No Awareness, Dental Status Current problems with teeth and/or dentures?: No Does patient usually wear dentures?: No  CIWA:    COWS:     Musculoskeletal: Strength & Muscle Tone: within normal limits Gait & Station: normal Patient leans: N/A  Psychiatric Specialty Exam: Physical Exam  Nursing note and vitals reviewed. Psychiatric: Her affect is blunt. Her speech is delayed. She is slowed, withdrawn and actively hallucinating. Thought content is paranoid and delusional. Cognition and memory are normal. She expresses impulsivity.    Review of Systems  Neurological: Negative.   Psychiatric/Behavioral: Positive for hallucinations.  All other systems reviewed and are negative.   Blood pressure 111/70, pulse 73, temperature 97.7 F (36.5 C), temperature source Oral, resp. rate 18, height 5\' 2"  (1.575 m), weight 63.5 kg (140 lb), SpO2 100 %.Body mass index is 25.61 kg/m.  General Appearance: Casual  Eye Contact:  Good  Speech:  Slow  Volume:  Decreased  Mood:  Anxious  Affect:  Blunt  Thought Process:  Disorganized, Linear and Descriptions of Associations: Tangential  Orientation:  Full (Time, Place, and Person)  Thought Content:  Delusions, Hallucinations: Auditory and Paranoid Ideation  Suicidal Thoughts:  No  Homicidal Thoughts:  No  Memory:  Immediate;   Fair Recent;   Fair Remote;    Fair  Judgement:  Poor  Insight:  Lacking  Psychomotor Activity:  Decreased  Concentration:  Concentration: Poor and Attention Span: Poor  Recall:  Poor  Fund of Knowledge:  Fair  Language:  Fair  Akathisia:  No  Handed:  Right  AIMS (if indicated):     Assets:  Communication Skills Desire for Improvement Financial Resources/Insurance Housing Physical Health Resilience Social Support  ADL's:  Intact  Cognition:  WNL  Sleep:  Number of Hours: 6.45     Treatment Plan Summary: Daily contact with patient to assess and evaluate symptoms and progress in treatment and Medication management   [name] is a 22 year old female with a history of schizoaffective disorder admitted for auditory hallucinations and paranoia in the context of medication noncompliance.  #Schizoaffective disorder -continue Lithium 600 mg BID, lrvrl in AM -continueInvega 9 mg daily -Invega sustenna injection234 mg given on Thursday -give second dose of Invega sustenna 156 mg by Thursday  #Anxiety -continueNeurontin 300 mg TID -stop Vistaril -start Clonazepam 0.5 mg TID  #Cannabis use disorder -minimizes problems and declines treatment  #Smoking cessation -nicotine patch is availabe  #Labs -lipid panel, TSH, [facility] normal -EKGreviewed, NSR with QTc of 420 -pregnancy test is negative  #Disposition -discharge to home with her parents -needs follow up with [facility] [place] private [facility] -needs a guardian and disability -will ask for 90 days of outpatient commitment      Kristine LineaJolanta Elaiza Shoberg, MD 05/16/2018, 10:16 AM

## 2018-05-16 MED ORDER — LORAZEPAM 2 MG/ML IJ SOLN
2.0000 mg | Freq: Once | INTRAMUSCULAR | Status: AC
  Administered 2018-05-16: 2 mg via INTRAMUSCULAR
  Filled 2018-05-16: qty 1

## 2018-05-16 MED ORDER — HALOPERIDOL LACTATE 5 MG/ML IJ SOLN
5.0000 mg | Freq: Four times a day (QID) | INTRAMUSCULAR | Status: DC | PRN
  Administered 2018-05-18: 5 mg via INTRAMUSCULAR
  Filled 2018-05-16: qty 1

## 2018-05-16 MED ORDER — HALOPERIDOL LACTATE 5 MG/ML IJ SOLN
5.0000 mg | Freq: Once | INTRAMUSCULAR | Status: AC
  Administered 2018-05-16: 5 mg via INTRAMUSCULAR
  Filled 2018-05-16: qty 1

## 2018-05-16 MED ORDER — LORAZEPAM 2 MG/ML IJ SOLN
2.0000 mg | Freq: Four times a day (QID) | INTRAMUSCULAR | Status: DC | PRN
  Administered 2018-05-18: 2 mg via INTRAMUSCULAR
  Filled 2018-05-16: qty 1

## 2018-05-16 MED ORDER — DIPHENHYDRAMINE HCL 50 MG/ML IJ SOLN
50.0000 mg | Freq: Four times a day (QID) | INTRAMUSCULAR | Status: DC | PRN
  Administered 2018-05-18: 50 mg via INTRAMUSCULAR
  Filled 2018-05-16: qty 1

## 2018-05-16 MED ORDER — DIPHENHYDRAMINE HCL 50 MG/ML IJ SOLN
50.0000 mg | Freq: Once | INTRAMUSCULAR | Status: AC
  Administered 2018-05-16: 50 mg via INTRAMUSCULAR
  Filled 2018-05-16: qty 1

## 2018-05-16 MED ORDER — CLONAZEPAM 0.5 MG PO TABS
0.5000 mg | ORAL_TABLET | Freq: Three times a day (TID) | ORAL | Status: DC
  Administered 2018-05-16 – 2018-05-20 (×14): 0.5 mg via ORAL
  Filled 2018-05-16 (×14): qty 1

## 2018-05-16 NOTE — Progress Notes (Signed)
Patient is responding to hallucination and dilutions engaging in disruptive behaviors, kicking doors and throwing herself on the floor and shouting I want a shot right now to calm me down .  People. Requiring nursing interventions , orders received from Dr, Beverely RisenPucillowska, see mar. Patient is maintained in her room  for safety of her self and others.noted.

## 2018-05-16 NOTE — Progress Notes (Signed)
Told patient that the doctor has ordered shots for you , patient respond and said "good that's what I'm here for".note.

## 2018-05-16 NOTE — Progress Notes (Signed)
Recreation Therapy Notes   Date: 05/16/2018  Time: 9:30 pm   Location: Craft Room   Behavioral response: N/A   Intervention Topic: Coping Skills  Discussion/Intervention: Patient did not attend group.   Clinical Observations/Feedback:  Patient did not attend group.   Wanell Lorenzi LRT/CTRS         Aleta Manternach 05/16/2018 10:16 AM 

## 2018-05-16 NOTE — Plan of Care (Signed)
Cooperative. Walked frequently in the hallway. Frequently calling staff, requesting ensure and Gatorade. Restless but saying "I am OK". Patient is disheveled and has not been able to perform proper hygiene. Unable to program in group. No aggressive behavior. Patient is redirectable and maintains a positive attitude. Currently in bed sleeping. Safety precautions maintained.

## 2018-05-16 NOTE — Progress Notes (Signed)
Received Kimberly Boyle this AM in her room,she slept in this morning and refused breakfast. She continues to endorse feeling anxious and was medicated per order. She remains isolative to her room throughout the day. This PM she was ambulating in the hallway and talking to herself out loud.

## 2018-05-16 NOTE — Progress Notes (Addendum)
[facility] MD Progress Note  05/17/2018 12:25 PM Kimberly Boyle  MRN:  [id number]  Subjective:   Kimberly Boyle lost her temper last night for no apparent reason. She threw herself on tha floor, started kicking the door and demanded injectable medications that she took gladly. She slept through the night. She was asleep this morning.  Met with the mother and SW about guardianship process. The mother reports that the patient experienced terrible hallucinations yesterday.  We will start Clozapine tonight as she continues to talk to herself loudly.  Principal Problem: Schizoaffective disorder, bipolar type ([place]) Diagnosis:   Patient Active Problem List   Diagnosis Date Noted  . Schizoaffective disorder, bipolar type ([place]) [F25.0] 05/06/2018    Priority: High  . Tobacco use disorder [F17.200] 05/08/2018  . Suicide attempt ([place]) [T14.91XA] 08/20/2017  . Cannabis use disorder, moderate, dependence ([place]) [F12.20] 08/20/2017  . Self-inflicted laceration of wrist [S61.519A] 08/20/2017  . Acetaminophen poisoning [T39.1X1A] 08/20/2017  . Generalized anxiety disorder [F41.1] 01/05/2017  . Mood disorder ([place]) [F39] 01/05/2017   Total Time spent with patient: 20 minutes  Past Psychiatric History: schizoaffective disorder bipolar type  Past Medical History:  Past Medical History:  Diagnosis Date  . Anxiety   . Arthritis   . Bipolar disorder ([place])   . Depression   . Major depressive disorder   . Panic disorder   . Schizophrenia ([place])    History reviewed. No pertinent surgical history. Family History:  Family History  Problem Relation Age of Onset  . Post-traumatic stress disorder Mother   . Depression Mother   . Depression Maternal Uncle   . Cancer Maternal Grandmother    Family Psychiatric  History: none Social History:  Social History   Substance and Sexual Activity  Alcohol Use No  . Frequency: Never     Social History   Substance and Sexual Activity  Drug Use No  . Types:  Marijuana    Social History   Socioeconomic History  . Marital status: Single    Spouse name: Not on file  . Number of children: 0  . Years of education: Not on file  . Highest education level: High school graduate  Occupational History    Comment: unemployed  Social Needs  . Financial resource strain: Not on file  . Food insecurity:    Worry: Not on file    Inability: Not on file  . Transportation needs:    Medical: No    Non-medical: No  Tobacco Use  . Smoking status: Current Every Day Smoker    Packs/day: 0.25    Types: Cigarettes  . Smokeless tobacco: Never Used  Substance and Sexual Activity  . Alcohol use: No    Frequency: Never  . Drug use: No    Types: Marijuana  . Sexual activity: Not Currently  Lifestyle  . Physical activity:    Days per week: 0 days    Minutes per session: 0 min  . Stress: To some extent  Relationships  . Social connections:    Talks on phone: Not on file    Gets together: Not on file    Attends religious service: Never    Active member of club or organization: No    Attends meetings of clubs or organizations: Never    Relationship status: Never married  Other Topics Concern  . Not on file  Social History Narrative   ** Merged History Encounter **       Additional Social History:  Sleep: Fair  Appetite:  Fair  Current Medications: Current Facility-Administered Medications  Medication Dose Route Frequency Provider Last Rate Last Dose  . acetaminophen (TYLENOL) tablet 650 mg  650 mg Oral Q6H PRN [name], Madie Reno, MD   650 mg at 05/16/18 1955  . alum & mag hydroxide-simeth (MAALOX/MYLANTA) 200-200-20 MG/5ML suspension 30 mL  30 mL Oral Q4H PRN [name], [name], MD      . clonazePAM (KLONOPIN) tablet 0.5 mg  0.5 mg Oral TID AC Vasili Fok B, MD   0.5 mg at 05/17/18 1204  . cloZAPine (CLOZARIL) tablet 50 mg  50 mg Oral QHS Taryn Shellhammer B, MD      . diphenhydrAMINE (BENADRYL) injection  50 mg  50 mg Intramuscular Q6H PRN Kiano Terrien B, MD      . docusate sodium (COLACE) capsule 100 mg  100 mg Oral Daily Kobie Matkins B, MD   100 mg at 05/17/18 0827  . feeding supplement (ENSURE ENLIVE) (ENSURE ENLIVE) liquid 237 mL  237 mL Oral TID BM Shastina Rua B, MD   237 mL at 05/17/18 1004  . gabapentin (NEURONTIN) capsule 300 mg  300 mg Oral TID Bryen Hinderman B, MD   300 mg at 05/17/18 1204  . haloperidol lactate (HALDOL) injection 5 mg  5 mg Intramuscular Q6H PRN Erisha Paugh B, MD      . lithium carbonate capsule 750 mg  750 mg Oral BID WC Prisma Decarlo B, MD      . LORazepam (ATIVAN) injection 2 mg  2 mg Intramuscular Q6H PRN Latresha Yahr B, MD      . magnesium hydroxide (MILK OF MAGNESIA) suspension 30 mL  30 mL Oral Daily PRN [name], [name], MD      . nicotine (NICODERM CQ - dosed in mg/24 hours) patch 21 mg  21 mg Transdermal Daily [name], Madie Reno, MD   21 mg at 05/17/18 0827  . paliperidone (INVEGA SUSTENNA) injection 234 mg  234 mg Intramuscular Q28 days Sharnette Kitamura B, MD   234 mg at 05/12/18 1737  . paliperidone (INVEGA) 24 hr tablet 9 mg  9 mg Oral Daily Avielle Imbert B, MD   9 mg at 05/17/18 0827    Lab Results:  Results for orders placed or performed during the hospital encounter of 05/06/18 (from the past 48 hour(s))  Lithium level     Status: Abnormal   Collection Time: 05/17/18  6:47 AM  Result Value Ref Range   Lithium Lvl 0.55 (L) 0.60 - 1.20 mmol/L    Comment: Performed at [facility], [address] [place], [place] [address]  CBC with Differential/Platelet     Status: None   Collection Time: 05/17/18 10:15 AM  Result Value Ref Range   WBC 9.3 3.6 - 11.0 K/uL   RBC 4.33 3.80 - 5.20 MIL/uL   Hemoglobin 13.6 12.0 - 16.0 g/dL   HCT 39.8 35.0 - 47.0 %   MCV 92.0 80.0 - 100.0 fL   MCH 31.4 26.0 - 34.0 pg   MCHC 34.2 32.0 - 36.0 g/dL   RDW 12.4 11.5 - 14.5 %   Platelets 282 150 - 440 K/uL    Neutrophils Relative % 67 %   Neutro Abs 6.3 1.4 - 6.5 K/uL   Lymphocytes Relative 20 %   Lymphs Abs 1.8 1.0 - 3.6 K/uL   Monocytes Relative 9 %   Monocytes Absolute 0.8 0.2 - 0.9 K/uL   Eosinophils Relative 3 %  Eosinophils Absolute 0.2 0 - 0.7 K/uL   Basophils Relative 1 %   Basophils Absolute 0.1 0 - 0.1 K/uL    Comment: Performed at [facility], [address] [place], [place] [address]    Blood Alcohol level:  Lab Results  Component Value Date   [facility] <10 05/05/2018   ETH <10 32/35/5732    Metabolic Disorder Labs: Lab Results  Component Value Date   HGBA1C 5.1 05/05/2018   MPG 99.67 05/05/2018   Lab Results  Component Value Date   PROLACTIN 132.8 (H) 10/25/2017   Lab Results  Component Value Date   CHOL 190 05/07/2018   TRIG 112 05/07/2018   HDL 44 05/07/2018   CHOLHDL 4.3 05/07/2018   VLDL 22 05/07/2018   LDLCALC 124 (H) 05/07/2018   LDLCALC 72 10/25/2017    Physical Findings: AIMS: Facial and Oral Movements Muscles of Facial Expression: None, normal Lips and Perioral Area: None, normal Jaw: None, normal Tongue: None, normal,Extremity Movements Upper (arms, wrists, hands, fingers): None, normal Lower (legs, knees, ankles, toes): None, normal, Trunk Movements Neck, shoulders, hips: None, normal, Overall Severity Severity of abnormal movements (highest score from questions above): None, normal Incapacitation due to abnormal movements: None, normal Patient's awareness of abnormal movements (rate only patient's report): No Awareness, Dental Status Current problems with teeth and/or dentures?: No Does patient usually wear dentures?: No  CIWA:    COWS:     Musculoskeletal: Strength & Muscle Tone: within normal limits Gait & Station: normal Patient leans: N/A  Psychiatric Specialty Exam: Physical Exam  Nursing note and vitals reviewed. Psychiatric: Her affect is blunt. Her speech is delayed. She is slowed, withdrawn and actively  hallucinating. Thought content is paranoid and delusional. Cognition and memory are impaired. She expresses impulsivity.    Review of Systems  Neurological: Negative.   Psychiatric/Behavioral: Positive for hallucinations and substance abuse.  All other systems reviewed and are negative.   Blood pressure 102/80, pulse 91, temperature 98.3 F (36.8 C), resp. rate 18, height '5\' 2"'  (1.575 m), weight 63.5 kg (140 lb), SpO2 99 %.Body mass index is 25.61 kg/m.  General Appearance: Disheveled  Eye Contact:  Minimal  Speech:  Slow  Volume:  Decreased  Mood:  Depressed  Affect:  Blunt  Thought Process:  Linear and Descriptions of Associations: Tangential  Orientation:  Full (Time, Place, and Person)  Thought Content:  Delusions, Hallucinations: Auditory and Paranoid Ideation  Suicidal Thoughts:  No  Homicidal Thoughts:  No  Memory:  Immediate;   Fair Recent;   Fair Remote;   Fair  Judgement:  Poor  Insight:  Lacking  Psychomotor Activity:  Psychomotor Retardation  Concentration:  Concentration: Fair and Attention Span: Fair  Recall:  [employer] of Knowledge:  Fair  Language:  Fair  Akathisia:  No  Handed:  Right  AIMS (if indicated):     Assets:  Communication Skills Desire for Improvement Financial Resources/Insurance Housing Physical Health Resilience Social Support  ADL's:  Intact  Cognition:  WNL  Sleep:  Number of Hours: 6.3     Treatment Plan Summary: Daily contact with patient to assess and evaluate symptoms and progress in treatment and Medication management   Kimberly Boyle is a 22 year old female with a history of schizoaffective disorder admitted for auditory hallucinations and paranoia in the context of medication noncompliance.  #Agitation -Haldol 5 mg, Ativan 2 mg and Benadryl 50 mg IM are available  #Schizoaffective disorder -increase Lithium to 750 mg BID, Li level  0.55 on 8/6 -continueInvega 9 mg daily -Invega sustenna injection234 mggiven on  Thursday -give second dose of Invega sustenna 156 mg tomorrow -start Clozapine 50 mg nightly   #Anxiety -continueNeurontin 300 mg TID -stop Vistaril -start Clonazepam 0.5 mg TID  #Cannabis use disorder -minimizes problems and declines treatment  #Smoking cessation -nicotine patch is availabe  #Labs -lipid panel, TSH, [facility] normal -EKGreviewed, NSR with QTc of 420 -pregnancy test is negative  #Disposition -discharge to home with her parents -needs follow [facility] with [facility] [place] private insurance -needs a guardian and disability -will ask for 90 days of outpatient commitment     Orson Slick, MD 05/17/2018, 12:25 PM

## 2018-05-17 LAB — CBC WITH DIFFERENTIAL/PLATELET
BASOS ABS: 0.1 10*3/uL (ref 0–0.1)
BASOS PCT: 1 %
EOS PCT: 3 %
Eosinophils Absolute: 0.2 10*3/uL (ref 0–0.7)
HCT: 39.8 % (ref 35.0–47.0)
Hemoglobin: 13.6 g/dL (ref 12.0–16.0)
LYMPHS PCT: 20 %
Lymphs Abs: 1.8 10*3/uL (ref 1.0–3.6)
MCH: 31.4 pg (ref 26.0–34.0)
MCHC: 34.2 g/dL (ref 32.0–36.0)
MCV: 92 fL (ref 80.0–100.0)
Monocytes Absolute: 0.8 10*3/uL (ref 0.2–0.9)
Monocytes Relative: 9 %
Neutro Abs: 6.3 10*3/uL (ref 1.4–6.5)
Neutrophils Relative %: 67 %
PLATELETS: 282 10*3/uL (ref 150–440)
RBC: 4.33 MIL/uL (ref 3.80–5.20)
RDW: 12.4 % (ref 11.5–14.5)
WBC: 9.3 10*3/uL (ref 3.6–11.0)

## 2018-05-17 LAB — LITHIUM LEVEL: LITHIUM LVL: 0.55 mmol/L — AB (ref 0.60–1.20)

## 2018-05-17 MED ORDER — CLOZAPINE 25 MG PO TABS
50.0000 mg | ORAL_TABLET | Freq: Every day | ORAL | Status: DC
  Administered 2018-05-17 – 2018-05-18 (×2): 50 mg via ORAL
  Filled 2018-05-17 (×2): qty 2

## 2018-05-17 MED ORDER — LITHIUM CARBONATE 300 MG PO CAPS
750.0000 mg | ORAL_CAPSULE | Freq: Two times a day (BID) | ORAL | Status: DC
  Administered 2018-05-17 – 2018-06-08 (×44): 750 mg via ORAL
  Filled 2018-05-17 (×45): qty 1

## 2018-05-17 NOTE — [facility] Group Notes (Signed)
05/17/2018 1PM  Type of Therapy/Topic:  Group Therapy:  Feelings about Diagnosis  Participation Level:  Did Not Attend   Description of Group:   This group will allow patients to explore their thoughts and feelings about diagnoses they have received. Patients will be guided to explore their level of understanding and acceptance of these diagnoses. Facilitator will encourage patients to process their thoughts and feelings about the reactions of others to their diagnosis and will guide patients in identifying ways to discuss their diagnosis with significant others in their lives. This group will be process-oriented, with patients participating in exploration of their own experiences, giving and receiving support, and processing challenge from other group members.   Therapeutic Goals: 1. Patient will demonstrate understanding of diagnosis as evidenced by identifying two or more symptoms of the disorder 2. Patient will be able to express two feelings regarding the diagnosis 3. Patient will demonstrate their ability to communicate their needs through discussion and/or role play  Summary of Patient Progress: Patient was encouraged and invited to attend group. Patient did not attend group. Social worker will continue to encourage group participation in the future.        Therapeutic Modalities:   Cognitive Behavioral Therapy Brief Therapy Feelings Identification    Shantel Wesely, LCSW 05/17/2018 1:29 PM  

## 2018-05-17 NOTE — [facility] Group Notes (Signed)
CSW Group Therapy Note  05/17/2018  Time:  0900  Type of Therapy and Topic: Group Therapy: Goals Group: SMART Goals    Participation Level:  Did Not Attend    Description of Group:   The purpose of a daily goals group is to assist and guide patients in setting recovery/wellness-related goals. The objective is to set goals as they relate to the crisis in which they were admitted. Patients will be using SMART goal modalities to set measurable goals. Characteristics of realistic goals will be discussed and patients will be assisted in setting and processing how one will reach their goal. Facilitator will also assist patients in applying interventions and coping skills learned in psycho-education groups to the SMART goal and process how one will achieve defined goal.    Therapeutic Goals:  -Patients will develop and document one goal related to or their crisis in which brought them into treatment.  -Patients will be guided by LCSW using SMART goal setting modality in how to set a measurable, attainable, realistic and time sensitive goal.  -Patients will process barriers in reaching goal.  -Patients will process interventions in how to overcome and successful in reaching goal.    Patient's Goal:   Pt was invited to attend group but chose not to attend. CSW will continue to encourage pt to attend group throughout their admission.   Therapeutic Modalities:  Motivational Interviewing  Cognitive Behavioral Therapy  Crisis Intervention Model  SMART goals setting  Heidi DachKelsey Sadler Teschner, MSW, LCSW Clinical Social Worker 05/17/2018 9:53 AM

## 2018-05-17 NOTE — [facility] Group Notes (Signed)
[facility] Group Notes:  (Nursing/MHT/Case Management/Adjunct)  Date:  05/17/2018  Time:  10:49 PM  Type of Therapy:  Group Therapy  Participation Level:  Did Not Attend    Kimberly NeighborsKeith D Nehemias Boyle 05/17/2018, 10:49 PM

## 2018-05-17 NOTE — Progress Notes (Signed)
[facility] MD Progress Note  05/18/2018 7:47 PM Kimberly Boyle  MRN:  [id number]  Subjective:    Kimberly Boyle is anxious and paranoid today. She refused to see her mother at visiting hours. She "freaked out" thinking that sme girls are pushing her eyeballs with their thumbs. She is in her room frightened and no longer feeling safe here. Asks for ativan.  Principal Problem: Schizoaffective disorder, bipolar type ([facility]) Diagnosis:   Patient Active Problem List   Diagnosis Date Noted  . Schizoaffective disorder, bipolar type ([facility]) [F25.0] 05/06/2018    Priority: High  . Tobacco use disorder [F17.200] 05/08/2018  . Suicide attempt ([facility]) [T14.91XA] 08/20/2017  . Cannabis use disorder, moderate, dependence ([facility]) [F12.20] 08/20/2017  . Self-inflicted laceration of wrist [S61.519A] 08/20/2017  . Acetaminophen poisoning [T39.1X1A] 08/20/2017  . Generalized anxiety disorder [F41.1] 01/05/2017  . Mood disorder ([facility]) [F39] 01/05/2017   Total Time spent with patient: 20 minutes  Past Psychiatric History: schizophrenia  Past Medical History:  Past Medical History:  Diagnosis Date  . Anxiety   . Arthritis   . Bipolar disorder ([facility])   . Depression   . Major depressive disorder   . Panic disorder   . Schizophrenia ([facility])    History reviewed. No pertinent surgical history. Family History:  Family History  Problem Relation Age of Onset  . Post-traumatic stress disorder Mother   . Depression Mother   . Depression Maternal Uncle   . Cancer Maternal Grandmother    Family Psychiatric  History: none Social History:  Social History   Substance and Sexual Activity  Alcohol Use No  . Frequency: Never     Social History   Substance and Sexual Activity  Drug Use No  . Types: Marijuana    Social History   Socioeconomic History  . Marital status: Single    Spouse name: Not on file  . Number of children: 0  . Years of education: Not on file  . Highest education level: High school graduate   Occupational History    Comment: unemployed  Social Needs  . Financial resource strain: Not on file  . Food insecurity:    Worry: Not on file    Inability: Not on file  . Transportation needs:    Medical: No    Non-medical: No  Tobacco Use  . Smoking status: Current Every Day Smoker    Packs/day: 0.25    Types: Cigarettes  . Smokeless tobacco: Never Used  Substance and Sexual Activity  . Alcohol use: No    Frequency: Never  . Drug use: No    Types: Marijuana  . Sexual activity: Not Currently  Lifestyle  . Physical activity:    Days per week: 0 days    Minutes per session: 0 min  . Stress: To some extent  Relationships  . Social connections:    Talks on phone: Not on file    Gets together: Not on file    Attends religious service: Never    Active member of club or organization: No    Attends meetings of clubs or organizations: Never    Relationship status: Never married  Other Topics Concern  . Not on file  Social History Narrative   ** Merged History Encounter **       Additional Social History:                         Sleep: Fair  Appetite:  Fair  Current Medications:  Current Facility-Administered Medications  Medication Dose Route Frequency Provider Last Rate Last Dose  . acetaminophen (TYLENOL) tablet 650 mg  650 mg Oral Q6H PRN [name], Jackquline DenmarkJohn T, MD   650 mg at 05/16/18 1955  . alum & mag hydroxide-simeth (MAALOX/MYLANTA) 200-200-20 MG/5ML suspension 30 mL  30 mL Oral Q4H PRN [name], [name], MD      . clonazePAM (KLONOPIN) tablet 0.5 mg  0.5 mg Oral TID AC Verle Brillhart B, MD   0.5 mg at 05/18/18 1701  . cloZAPine (CLOZARIL) tablet 50 mg  50 mg Oral QHS Kameron Blethen B, MD   50 mg at 05/17/18 2108  . diphenhydrAMINE (BENADRYL) injection 50 mg  50 mg Intramuscular Q6H PRN Chales Pelissier B, MD   50 mg at 05/18/18 1706  . docusate sodium (COLACE) capsule 100 mg  100 mg Oral Daily Cristina Mattern B, MD   100 mg at 05/18/18 0843   . feeding supplement (ENSURE ENLIVE) (ENSURE ENLIVE) liquid 237 mL  237 mL Oral TID BM Reyhan Moronta B, MD   237 mL at 05/18/18 1436  . gabapentin (NEURONTIN) capsule 300 mg  300 mg Oral TID Hava Massingale B, MD   300 mg at 05/18/18 1701  . haloperidol lactate (HALDOL) injection 5 mg  5 mg Intramuscular Q6H PRN Paulette Lynch B, MD   5 mg at 05/18/18 1706  . lithium carbonate capsule 750 mg  750 mg Oral BID WC Ayvin Lipinski B, MD   750 mg at 05/18/18 1716  . LORazepam (ATIVAN) injection 2 mg  2 mg Intramuscular Q6H PRN Shacola Schussler B, MD   2 mg at 05/18/18 1705  . magnesium hydroxide (MILK OF MAGNESIA) suspension 30 mL  30 mL Oral Daily PRN [name], [name], MD      . nicotine (NICODERM CQ - dosed in mg/24 hours) patch 21 mg  21 mg Transdermal Daily [name], Jackquline DenmarkJohn T, MD   21 mg at 05/18/18 0843  . paliperidone (INVEGA SUSTENNA) injection 234 mg  234 mg Intramuscular Q28 days Mylez Venable B, MD   234 mg at 05/12/18 1737  . paliperidone (INVEGA) 24 hr tablet 9 mg  9 mg Oral Daily Lekeisha Arenas B, MD   9 mg at 05/18/18 [id number]    Lab Results:  Results for orders placed or performed during the hospital encounter of 05/06/18 (from the past 48 hour(s))  Lithium level     Status: Abnormal   Collection Time: 05/17/18  6:47 AM  Result Value Ref Range   Lithium Lvl 0.55 (L) 0.60 - 1.20 mmol/L    Comment: Performed at [facility], [address] [place], [place] [address]  CBC with Differential/Platelet     Status: None   Collection Time: 05/17/18 10:15 AM  Result Value Ref Range   WBC 9.3 3.6 - 11.0 K/uL   RBC 4.33 3.80 - 5.20 MIL/uL   Hemoglobin 13.6 12.0 - 16.0 g/dL   HCT 57.839.8 46.935.0 - 62.947.0 %   MCV 92.0 80.0 - 100.0 fL   MCH 31.4 26.0 - 34.0 pg   MCHC 34.2 32.0 - 36.0 g/dL   RDW 52.812.4 41.311.5 - 24.414.5 %   Platelets 282 150 - 440 K/uL   Neutrophils Relative % 67 %   Neutro Abs 6.3 1.4 - 6.5 K/uL   Lymphocytes Relative 20 %   Lymphs Abs 1.8 1.0 - 3.6  K/uL   Monocytes Relative 9 %   Monocytes Absolute 0.8 0.2 - 0.9 K/uL  Eosinophils Relative 3 %   Eosinophils Absolute 0.2 0 - 0.7 K/uL   Basophils Relative 1 %   Basophils Absolute 0.1 0 - 0.1 K/uL    Comment: Performed at [facility], [address] [place], [place] [address]    Blood Alcohol level:  Lab Results  Component Value Date   [facility] <10 05/05/2018   ETH <10 10/12/2017    Metabolic Disorder Labs: Lab Results  Component Value Date   HGBA1C 5.1 05/05/2018   MPG 99.67 05/05/2018   Lab Results  Component Value Date   PROLACTIN 132.8 (H) 10/25/2017   Lab Results  Component Value Date   CHOL 190 05/07/2018   TRIG 112 05/07/2018   HDL 44 05/07/2018   CHOLHDL 4.3 05/07/2018   VLDL 22 05/07/2018   LDLCALC 124 (H) 05/07/2018   LDLCALC 72 10/25/2017    Physical Findings: AIMS: Facial and Oral Movements Muscles of Facial Expression: None, normal Lips and Perioral Area: None, normal Jaw: None, normal Tongue: None, normal,Extremity Movements Upper (arms, wrists, hands, fingers): None, normal Lower (legs, knees, ankles, toes): None, normal, Trunk Movements Neck, shoulders, hips: None, normal, Overall Severity Severity of abnormal movements (highest score from questions above): None, normal Incapacitation due to abnormal movements: None, normal Patient's awareness of abnormal movements (rate only patient's report): No Awareness, Dental Status Current problems with teeth and/or dentures?: No Does patient usually wear dentures?: No  CIWA:    COWS:     Musculoskeletal: Strength & Muscle Tone: within normal limits Gait & Station: normal Patient leans: N/A  Psychiatric Specialty Exam: Physical Exam  Nursing note and vitals reviewed. Psychiatric: Her speech is normal. Her affect is blunt and labile. She is withdrawn and actively hallucinating. Thought content is paranoid and delusional. Cognition and memory are impaired. She expresses impulsivity.     Review of Systems  Neurological: Negative.   Psychiatric/Behavioral: Positive for hallucinations and substance abuse.  All other systems reviewed and are negative.   Blood pressure 105/63, pulse 82, temperature 97.7 F (36.5 C), temperature source Oral, resp. rate 16, height 5\' 2"  (1.575 m), weight 63.5 kg (140 lb), SpO2 99 %.Body mass index is 25.61 kg/m.  General Appearance: Fairly Groomed  Eye Contact:  Good  Speech:  Clear and Coherent and Slow  Volume:  Decreased  Mood:  Euthymic  Affect:  Flat  Thought Process:  Disorganized and Descriptions of Associations: Tangential  Orientation:  Full (Time, Place, and Person)  Thought Content:  Illogical, Delusions, Hallucinations: Auditory and Paranoid Ideation  Suicidal Thoughts:  No  Homicidal Thoughts:  No  Memory:  Immediate;   Poor Recent;   Poor Remote;   Poor  Judgement:  Poor  Insight:  Lacking  Psychomotor Activity:  Psychomotor Retardation  Concentration:  Concentration: Poor and Attention Span: Poor  Recall:  Poor  Fund of Knowledge:  Fair  Language:  Fair  Akathisia:  No  Handed:  Right  AIMS (if indicated):     Assets:  Communication Skills Desire for Improvement Financial Resources/Insurance Housing Physical Health Resilience Social Support  ADL's:  Impaired  Cognition:  WNL  Sleep:  Number of Hours: 7     Treatment Plan Summary: Daily contact with patient to assess and evaluate symptoms and progress in treatment and Medication management   Ms. Trefry is a 22 year old female with a history of schizoaffective disorder admitted for auditory hallucinations and paranoia in the context of medication noncompliance.  #Agitation -Haldol 5 mg, Ativan 2 mg and Benadryl  50 mg IM are available  #Schizoaffective disorder -increase Lithium to 750 mg BID, Li level 0.55 on 8/6 -continueInvega 9 mg daily -Invega sustenna injection234 mggiven on Thursday -give second dose of Invega sustenna 156 mg -start  Clozapine 50 mg nightly   #Anxiety -continueNeurontin 300 mg TID -stop Vistaril -start Clonazepam 0.5 mg TID  #Cannabis use disorder -minimizes problems and declines treatment  #Smoking cessation -nicotine patch is availabe  #Labs -lipid panel, TSH, [facility] normal -EKGreviewed, NSR with QTc of 420 -pregnancy test is negative  #Disposition -discharge to home with her parents -needs [facility] [place] private [facility] -needs a guardian and disability -will ask for 90 days of outpatient commitment    Kristine Linea, MD 05/18/2018, 7:47 PM

## 2018-05-17 NOTE — Plan of Care (Signed)
Patient was sleeping most of the shift.Did not get up for breakfast and lunch.Drinking ensure.Compliant with medications.Patient talking to herself in the hallway.Denies SI,HI and AVH.Support and encouragement given.

## 2018-05-17 NOTE — Progress Notes (Signed)
Patient is calm and quiet in her room in bed , medication is effective noted.

## 2018-05-18 MED ORDER — PALIPERIDONE PALMITATE ER 156 MG/ML IM SUSY
156.0000 mg | PREFILLED_SYRINGE | Freq: Once | INTRAMUSCULAR | Status: AC
  Administered 2018-05-19: 156 mg via INTRAMUSCULAR
  Filled 2018-05-18: qty 1

## 2018-05-18 MED ORDER — LORAZEPAM 2 MG PO TABS
2.0000 mg | ORAL_TABLET | Freq: Once | ORAL | Status: AC
  Administered 2018-05-18: 2 mg via ORAL
  Filled 2018-05-18: qty 1

## 2018-05-18 NOTE — [facility] Group Notes (Signed)
[facility] Group Notes:  (Nursing/MHT/Case Management/Adjunct)  Date:  05/18/2018  Time:  9:40 PM  Type of Therapy:  Group Therapy  Participation Level:  Did Not Attend   Mayra NeerJackie L Camia Dipinto 05/18/2018, 9:40 PM

## 2018-05-18 NOTE — Plan of Care (Signed)
Patient is calm this evening, isolate self in her room, reminded of her coping skills, encourage patient to attend groups with peers, to improve her mental and emotional status, compliant with her medicines , denies SI/HI, patient is still experiencing auditory hallucinations, safety is maintained, 15 minute rounding in progress.  Problem: Education: Goal: Knowledge of [facility] Education information/materials will improve Outcome: Progressing Goal: Mental status will improve Outcome: Progressing   Problem: Coping: Goal: Coping ability will improve Outcome: Progressing Goal: Will verbalize feelings Outcome: Progressing   Problem: Safety: Goal: Ability to remain free from injury will improve Outcome: Progressing   Problem: Self-Concept: Goal: Will verbalize positive feelings about self Outcome: Progressing   Problem: Education: Goal: Utilization of techniques to improve thought processes will improve Outcome: Progressing Goal: Knowledge of the prescribed therapeutic regimen will improve Outcome: Progressing   Problem: Activity: Goal: Interest or engagement in leisure activities will improve Outcome: Progressing Goal: Imbalance in normal sleep/wake cycle will improve Outcome: Progressing   Problem: Coping: Goal: Coping ability will improve Outcome: Progressing Goal: Will verbalize feelings Outcome: Progressing   Problem: Health Behavior/Discharge Planning: Goal: Ability to make decisions will improve Outcome: Progressing Goal: Compliance with therapeutic regimen will improve Outcome: Progressing   Problem: Role Relationship: Goal: Will demonstrate positive changes in social behaviors and relationships Outcome: Progressing   Problem: Safety: Goal: Ability to disclose and discuss suicidal ideas will improve Outcome: Progressing Goal: Ability to identify and utilize support systems that promote safety will improve Outcome: Progressing   Problem:  Self-Concept: Goal: Will verbalize positive feelings about self Outcome: Progressing Goal: Level of anxiety will decrease Outcome: Progressing   Problem: Education: Goal: Knowledge of General Education information will improve Description Including pain rating scale, medication(s)/side effects and non-pharmacologic comfort measures Outcome: Progressing

## 2018-05-18 NOTE — Progress Notes (Signed)
Patient has been in her room but frequently coming to the nurses station. Did not program in group. Continues to complain of anxiety and frequently requesting medications and ensure. Emotional support provided and medications administered as ordered. Safety precautions reinforced.

## 2018-05-18 NOTE — Plan of Care (Signed)
Continues to express anxiety  but cooperative

## 2018-05-18 NOTE — Progress Notes (Signed)
Recreation Therapy Notes  Date: 05/18/2018  Time: 9:30 am   Location: Craft Room   Behavioral response: N/A   Intervention Topic: Goals  Discussion/Intervention: Patient did not attend group.   Clinical Observations/Feedback:  Patient did not attend group.   Carrieann Spielberg LRT/CTRS         Jacque Byron 05/18/2018 1:42 PM 

## 2018-05-18 NOTE — Tx Team (Signed)
Interdisciplinary Treatment and Diagnostic Plan Update  05/18/2018 Time of Session: 10:50am Kimberly Boyle MRN: [id number]  Principal Diagnosis: Schizoaffective disorder, bipolar type [name])  Secondary Diagnoses: Principal Problem:   Schizoaffective disorder, bipolar type (HCC) Active Problems:   Cannabis use disorder, moderate, dependence (HCC)   Tobacco use disorder   Current Medications:  Current Facility-Administered Medications  Medication Dose Route Frequency Provider Last Rate Last Dose  . acetaminophen (TYLENOL) tablet 650 mg  650 mg Oral Q6H PRN [name], [name], MD   650 mg at 05/16/18 1955  . alum & mag hydroxide-simeth (MAALOX/MYLANTA) 200-200-20 MG/5ML suspension 30 mL  30 mL Oral Q4H PRN [name], [name], MD      . clonazePAM (KLONOPIN) tablet 0.5 mg  0.5 mg Oral TID AC Pucilowska, [name], MD   0.5 mg at 05/18/18 1157  . cloZAPine (CLOZARIL) tablet 50 mg  50 mg Oral QHS Pucilowska, [name], MD   50 mg at 05/17/18 2108  . diphenhydrAMINE (BENADRYL) injection 50 mg  50 mg Intramuscular Q6H PRN Pucilowska, [name], MD      . docusate sodium (COLACE) capsule 100 mg  100 mg Oral Daily Pucilowska, [name], MD   100 mg at 05/18/18 0843  . feeding supplement (ENSURE ENLIVE) (ENSURE ENLIVE) liquid 237 mL  237 mL Oral TID BM Pucilowska, [name], MD   237 mL at 05/18/18 1037  . gabapentin (NEURONTIN) capsule 300 mg  300 mg Oral TID Pucilowska, [name], MD   300 mg at 05/18/18 1157  . haloperidol lactate (HALDOL) injection 5 mg  5 mg Intramuscular Q6H PRN Pucilowska, [name], MD      . lithium carbonate capsule 750 mg  750 mg Oral BID WC Pucilowska, [name], MD   750 mg at 05/18/18 0844  . LORazepam (ATIVAN) injection 2 mg  2 mg Intramuscular Q6H PRN Pucilowska, [name], MD      . magnesium hydroxide (MILK OF MAGNESIA) suspension 30 mL  30 mL Oral Daily PRN [name], [name], MD      . nicotine (NICODERM CQ - dosed in mg/24 hours) patch 21 mg  21 mg Transdermal Daily  [name], [name], MD   21 mg at 05/18/18 0843  . paliperidone (INVEGA SUSTENNA) injection 234 mg  234 mg Intramuscular Q28 days Pucilowska, [name], MD   234 mg at 05/12/18 1737  . paliperidone (INVEGA) 24 hr tablet 9 mg  9 mg Oral Daily Pucilowska, [name], MD   9 mg at 05/18/18 0843   PTA Medications: Medications Prior to Admission  Medication Sig Dispense Refill Last Dose  . benztropine (COGENTIN) 0.5 MG tablet Take 1 tablet (0.5 mg total) by mouth 2 (two) times daily. 60 tablet 1 unknown at unknown  . fluPHENAZine (PROLIXIN) 10 MG tablet Take 20 mg by mouth at bedtime.  0 unknown at unknown  . fluPHENAZine (PROLIXIN) 5 MG tablet Take 5 mg by mouth 2 (two) times daily.  0 unknown at unknown  . hydrOXYzine (ATARAX/VISTARIL) 25 MG tablet Take 1 tablet (25 mg total) by mouth every 8 (eight) hours as needed for anxiety. (Patient not taking: Reported on 05/05/2018) 90 tablet 1 Not Taking at Unknown time  . lithium carbonate (ESKALITH) 450 MG CR tablet Take 1 tablet (450 mg total) by mouth 2 (two) times daily. 60 tablet 1 unknown at unknown  . risperiDONE (RISPERDAL) 2 MG tablet Take 0.5 tablets (1 mg total) by mouth daily. 15 tablet 1 unknown at unknown  .  risperiDONE microspheres (RISPERDAL CONSTA) 25 MG injection Inject 2 mLs (25 mg total) into the muscle every 14 (fourteen) days. First dose 12/17/2017 (Patient not taking: Reported on 05/05/2018) 1 each 1 Not Taking  . 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   Physician Treatment Plan for Primary Diagnosis: Schizoaffective disorder, bipolar type (HCC) Long Term Goal(s): Improvement in symptoms so  as ready for discharge Improvement in symptoms so as ready for discharge   Short Term Goals: Ability to identify changes in lifestyle to reduce recurrence of condition will improve Ability to identify changes in lifestyle to reduce recurrence of condition will improve  Medication Management: Evaluate patient's response, side effects, and tolerance of medication regimen.  Therapeutic Interventions: 1 to 1 sessions, Unit Group sessions and Medication administration.  Evaluation of Outcomes: Progressing  Physician Treatment Plan for Secondary Diagnosis: Principal Problem:   Schizoaffective disorder, bipolar type (HCC) Active Problems:   Cannabis use disorder, moderate, dependence (HCC)   Tobacco use disorder  Long Term Goal(s): Improvement in symptoms so as ready for discharge Improvement in symptoms so as ready for discharge   Short Term Goals: Ability to identify changes in lifestyle to reduce recurrence of condition will improve Ability to identify changes in lifestyle to reduce recurrence of condition will improve     Medication Management: Evaluate patient's response, side effects, and tolerance of medication regimen.  Therapeutic Interventions: 1 to 1 sessions, Unit Group sessions and Medication administration.  Evaluation of Outcomes: Progressing   RN Treatment Plan for Primary Diagnosis: Schizoaffective disorder, bipolar type (HCC) Long Term Goal(s): Knowledge of disease and therapeutic regimen to maintain health will improve  Short Term Goals: Ability to identify and develop effective coping behaviors will improve and Compliance with prescribed medications will improve  Medication Management: RN will administer medications as ordered by provider, will assess and evaluate patient's response and provide education to patient for prescribed medication. RN will report any adverse and/or side effects to prescribing provider.  Therapeutic Interventions: 1 on 1 counseling  sessions, Psychoeducation, Medication administration, Evaluate responses to treatment, Monitor vital signs and CBGs as ordered, Perform/monitor CIWA, COWS, AIMS and Fall Risk screenings as ordered, Perform wound care treatments as ordered.  Evaluation of Outcomes: Progressing   LCSW Treatment Plan for Primary Diagnosis: Schizoaffective disorder, bipolar type (HCC) Long Term Goal(s): Safe transition to appropriate next level of care at discharge, Engage patient in therapeutic group addressing interpersonal concerns.  Short Term Goals: Engage patient in aftercare planning with referrals and resources, Identify triggers associated with mental health/substance abuse issues and Increase skills for wellness and recovery  Therapeutic Interventions: Assess for all discharge needs, 1 to 1 time with Social worker, Explore available resources and support systems, Assess for adequacy in community support network, Educate family and significant other(s) on suicide prevention, Complete Psychosocial Assessment, Interpersonal group therapy.  Evaluation of Outcomes: Progressing   Progress in Treatment: Attending groups: No. Participating in groups: No. Taking medication as prescribed: Yes. Toleration medication: Yes. Family/Significant other contact made: Yes, individual(s) contacted:  Pt's mother, Doyne Ellinger, has been contacted. Patient understands diagnosis: Yes. Discussing patient identified problems/goals with staff: Yes. Medical problems stabilized or resolved: No. Denies suicidal/homicidal ideation: No. Issues/concerns per patient self-inventory:  No. Other:    New problem(s) identified: No, Describe:     New Short Term/Long Term Goal(s):  Patient Goals:  To feel safe.   Discharge Plan or Barriers: Pt continues to experience AH and paranoia.  She recently exhibited some aggressive behaviors.  Discharge plan continues to be that pt will likely return home with her parents and follow up [place]  [place].  Reason for Continuation of Hospitalization: Delusions  Hallucinations Medication stabilization  Estimated Length of Stay: 5-7 days    Attendees: Patient: 05/18/2018 3:41 PM  Physician: Kristine LineaJolanta Pucilowska, MD 05/18/2018 3:41 PM  Nursing: Hulan AmatoGwen Farrish, RN 05/18/2018 3:41 PM  RN Care Manager: 05/18/2018 3:41 PM  Social Worker: Huey RomansSonya Ranyah Groeneveld, LCSW 05/18/2018 3:41 PM  Recreational Therapist: Garret ReddishShay Outlaw, LRT 05/18/2018 3:41 PM  Other: Johny Shearsassandra Jarrett, LCSWA 05/18/2018 3:41 PM  Other: Heidi DachKelsey Craig, LCSW 05/18/2018 3:41 PM  Other: 05/18/2018 3:41 PM    Scribe for Treatment Team: Alease FrameSonya S Bethzy Hauck, LCSW 05/18/2018 3:41 PM

## 2018-05-18 NOTE — [facility] Group Notes (Signed)
LCSW Group Therapy Note  05/18/2018 1:00 pm  Type of Therapy/Topic:  Group Therapy:  Emotion Regulation  Participation Level:  Did Not Attend   Description of Group:    The purpose of this group is to assist patients in learning to regulate negative emotions and experience positive emotions. Patients will be guided to discuss ways in which they have been vulnerable to their negative emotions. These vulnerabilities will be juxtaposed with experiences of positive emotions or situations, and patients will be challenged to use positive emotions to combat negative ones. Special emphasis will be placed on coping with negative emotions in conflict situations, and patients will process healthy conflict resolution skills.  Therapeutic Goals: 1. Patient will identify two positive emotions or experiences to reflect on in order to balance out negative emotions 2. Patient will label two or more emotions that they find the most difficult to experience 3. Patient will demonstrate positive conflict resolution skills through discussion and/or role plays  Summary of Patient Progress:  Kimberly SagoSarah was invited to today's group, but chose not to attend.     Therapeutic Modalities:   Cognitive Behavioral Therapy Feelings Identification Dialectical Behavioral Therapy

## 2018-05-18 NOTE — Plan of Care (Signed)
Patient came to staff to get something for anxiety.Patient states "there is bold head lady coming from [place] to attack [place].She is going to admit herself in this unit."Patient got agitated and started throwing thrush bin and cup with water.She pulled her hair "why nobody listen to me?".Patient asking for the "injections".PRN injections given.Patient calm down after that.

## 2018-05-19 MED ORDER — CLOZAPINE 100 MG PO TABS
100.0000 mg | ORAL_TABLET | Freq: Every day | ORAL | Status: DC
  Administered 2018-05-19 – 2018-05-20 (×2): 100 mg via ORAL
  Filled 2018-05-19: qty 4
  Filled 2018-05-19: qty 1

## 2018-05-19 NOTE — [facility] Group Notes (Signed)
LCSW Group Therapy Note   05/19/2018  Time: 1PM  Type of Therapy/Topic:  Group Therapy:  Balance in Life  Participation Level:  Did Not Attend  Description of Group:   This group will address the concept of balance and how it feels and looks when one is unbalanced. Patients will be encouraged to process areas in their lives that are out of balance and identify reasons for remaining unbalanced. Facilitators will guide patients in utilizing problem-solving interventions to address and correct the stressor making their life unbalanced. Understanding and applying boundaries will be explored and addressed for obtaining and maintaining a balanced life. Patients will be encouraged to explore ways to assertively make their unbalanced needs known to significant others in their lives, using other group members and facilitator for support and feedback.  Therapeutic Goals: 1. Patient will identify two or more emotions or situations they have that consume much of in their lives. 2. Patient will identify signs/triggers that life has become out of balance:  3. Patient will identify two ways to set boundaries in order to achieve balance in their lives:  4. Patient will demonstrate ability to communicate their needs through discussion and/or role plays  Summary of Patient Progress: Pt was invited to attend group but chose not to attend. CSW will continue to encourage pt to attend group throughout their admission.     Therapeutic Modalities:   Cognitive Behavioral Therapy Solution-Focused Therapy Assertiveness Training  Heidi DachKelsey Luetta Piazza, MSW, LCSW Clinical Social Worker 05/19/2018 2:22 PM

## 2018-05-19 NOTE — Progress Notes (Signed)
[facility] MD Progress Note  05/19/2018 5:46 PM Janit Cutter  MRN:  [id number]  Subjective:    Shyrl continues to be psychotic: hallucinating, paranoid, delusional and easily agitated. She was screaming lodly frightened of her voices and visions but denies hallucinations. Her hygiene and grooming is bad. She keeps mostly to herself. She is slightly sedated in am, likely from Clozapine.  Spoke with her mother today who came to pick up a letter of support for guardianship.   Principal Problem: Schizoaffective disorder, bipolar type (HCC) Diagnosis:   Patient Active Problem List   Diagnosis Date Noted  . Schizoaffective disorder, bipolar type (HCC) [F25.0] 05/06/2018    Priority: High  . Tobacco use disorder [F17.200] 05/08/2018  . Suicide attempt (HCC) [T14.91XA] 08/20/2017  . Cannabis use disorder, moderate, dependence (HCC) [F12.20] 08/20/2017  . Self-inflicted laceration of wrist [S61.519A] 08/20/2017  . Acetaminophen poisoning [T39.1X1A] 08/20/2017  . Generalized anxiety disorder [F41.1] 01/05/2017  . Mood disorder (HCC) [F39] 01/05/2017   Total Time spent with patient: 20 minutes  Past Psychiatric History: bipolar  Past Medical History:  Past Medical History:  Diagnosis Date  . Anxiety   . Arthritis   . Bipolar disorder (HCC)   . Depression   . Major depressive disorder   . Panic disorder   . Schizophrenia (HCC)    History reviewed. No pertinent surgical history. Family History:  Family History  Problem Relation Age of Onset  . Post-traumatic stress disorder Mother   . Depression Mother   . Depression Maternal Uncle   . Cancer Maternal Grandmother    Family Psychiatric  History: none Social History:  Social History   Substance and Sexual Activity  Alcohol Use No  . Frequency: Never     Social History   Substance and Sexual Activity  Drug Use No  . Types: Marijuana    Social History   Socioeconomic History  . Marital status: Single    Spouse name: Not  on file  . Number of children: 0  . Years of education: Not on file  . Highest education level: High school graduate  Occupational History    Comment: unemployed  Social Needs  . Financial resource strain: Not on file  . Food insecurity:    Worry: Not on file    Inability: Not on file  . Transportation needs:    Medical: No    Non-medical: No  Tobacco Use  . Smoking status: Current Every Day Smoker    Packs/day: 0.25    Types: Cigarettes  . Smokeless tobacco: Never Used  Substance and Sexual Activity  . Alcohol use: No    Frequency: Never  . Drug use: No    Types: Marijuana  . Sexual activity: Not Currently  Lifestyle  . Physical activity:    Days per week: 0 days    Minutes per session: 0 min  . Stress: To some extent  Relationships  . Social connections:    Talks on phone: Not on file    Gets together: Not on file    Attends religious service: Never    Active member of club or organization: No    Attends meetings of clubs or organizations: Never    Relationship status: Never married  Other Topics Concern  . Not on file  Social History Narrative   ** Merged History Encounter **       Additional Social History:  Sleep: Fair  Appetite:  Fair  Current Medications: Current Facility-Administered Medications  Medication Dose Route Frequency Provider Last Rate Last Dose  . acetaminophen (TYLENOL) tablet 650 mg  650 mg Oral Q6H PRN [name], Jackquline DenmarkJohn T, MD   650 mg at 05/16/18 1955  . alum & mag hydroxide-simeth (MAALOX/MYLANTA) 200-200-20 MG/5ML suspension 30 mL  30 mL Oral Q4H PRN [name], [name], MD      . clonazePAM (KLONOPIN) tablet 0.5 mg  0.5 mg Oral TID AC Malita Ignasiak B, MD   0.5 mg at 05/19/18 1542  . cloZAPine (CLOZARIL) tablet 100 mg  100 mg Oral QHS Audray Rumore B, MD      . diphenhydrAMINE (BENADRYL) injection 50 mg  50 mg Intramuscular Q6H PRN Kadance Mccuistion B, MD   50 mg at 05/18/18 1706  . docusate  sodium (COLACE) capsule 100 mg  100 mg Oral Daily Tritia Endo B, MD   100 mg at 05/18/18 0843  . feeding supplement (ENSURE ENLIVE) (ENSURE ENLIVE) liquid 237 mL  237 mL Oral TID BM Ruston Fedora B, MD   237 mL at 05/18/18 2006  . gabapentin (NEURONTIN) capsule 300 mg  300 mg Oral TID Jaren Vanetten B, MD   300 mg at 05/19/18 1541  . haloperidol lactate (HALDOL) injection 5 mg  5 mg Intramuscular Q6H PRN Vallery Mcdade B, MD   5 mg at 05/18/18 1706  . lithium carbonate capsule 750 mg  750 mg Oral BID WC Raiven Belizaire B, MD   750 mg at 05/19/18 1039  . LORazepam (ATIVAN) injection 2 mg  2 mg Intramuscular Q6H PRN Marlen Koman B, MD   2 mg at 05/18/18 1705  . magnesium hydroxide (MILK OF MAGNESIA) suspension 30 mL  30 mL Oral Daily PRN [name], [name], MD      . nicotine (NICODERM CQ - dosed in mg/24 hours) patch 21 mg  21 mg Transdermal Daily [name], Jackquline DenmarkJohn T, MD   21 mg at 05/18/18 0843  . paliperidone (INVEGA SUSTENNA) injection 234 mg  234 mg Intramuscular Q28 days Dezaree Tracey B, MD   234 mg at 05/12/18 1737  . paliperidone (INVEGA) 24 hr tablet 9 mg  9 mg Oral Daily Odarius Dines B, MD   9 mg at 05/19/18 1038    Lab Results: No results found for this or any previous visit (from the past 48 hour(s)).  Blood Alcohol level:  Lab Results  Component Value Date   ETH <10 05/05/2018   ETH <10 10/12/2017    Metabolic Disorder Labs: Lab Results  Component Value Date   HGBA1C 5.1 05/05/2018   MPG 99.67 05/05/2018   Lab Results  Component Value Date   PROLACTIN 132.8 (H) 10/25/2017   Lab Results  Component Value Date   CHOL 190 05/07/2018   TRIG 112 05/07/2018   HDL 44 05/07/2018   CHOLHDL 4.3 05/07/2018   VLDL 22 05/07/2018   LDLCALC 124 (H) 05/07/2018   LDLCALC 72 10/25/2017    Physical Findings: AIMS: Facial and Oral Movements Muscles of Facial Expression: None, normal Lips and Perioral Area: None, normal Jaw: None,  normal Tongue: None, normal,Extremity Movements Upper (arms, wrists, hands, fingers): None, normal Lower (legs, knees, ankles, toes): None, normal, Trunk Movements Neck, shoulders, hips: None, normal, Overall Severity Severity of abnormal movements (highest score from questions above): None, normal Incapacitation due to abnormal movements: None, normal Patient's awareness of abnormal movements (rate only patient's report): No Awareness, Dental Status Current problems with teeth  and/or dentures?: No Does patient usually wear dentures?: No  CIWA:    COWS:     Musculoskeletal: Strength & Muscle Tone: within normal limits Gait & Station: normal Patient leans: N/A  Psychiatric Specialty Exam: Physical Exam  Nursing note and vitals reviewed. Psychiatric: Her speech is normal. Her affect is blunt, labile and inappropriate. She is slowed, withdrawn and actively hallucinating. Thought content is paranoid and delusional. Cognition and memory are impaired. She expresses impulsivity.    Review of Systems  Neurological: Negative.   Psychiatric/Behavioral: Positive for hallucinations.  All other systems reviewed and are negative.   Blood pressure 115/74, pulse (!) 118, temperature 98.5 F (36.9 C), temperature source Oral, resp. rate 19, height 5\' 2"  (1.575 m), weight 63.5 kg, SpO2 99 %.Body mass index is 25.61 kg/m.  General Appearance: Disheveled  Eye Contact:  Good  Speech:  Clear and Coherent  Volume:  Decreased  Mood:  Dysphoric  Affect:  Flat  Thought Process:  Goal Directed and Descriptions of Associations: Loose  Orientation:  Full (Time, Place, and Person)  Thought Content:  Delusions, Hallucinations: Auditory Tactile Visual and Paranoid Ideation  Suicidal Thoughts:  No  Homicidal Thoughts:  No  Memory:  Immediate;   Fair Recent;   Fair Remote;   Fair  Judgement:  Poor  Insight:  Lacking  Psychomotor Activity:  Decreased  Concentration:  Concentration: Fair and Attention  Span: Fair  Recall:  [employer] of Knowledge:  Fair  Language:  Fair  Akathisia:  No  Handed:  Right  AIMS (if indicated):     Assets:  Communication Skills Desire for Improvement Financial Resources/Insurance Housing Physical Health Resilience Social Support  ADL's:  Intact  Cognition:  WNL  Sleep:  Number of Hours: 6.15     Treatment Plan Summary: Daily contact with patient to assess and evaluate symptoms and progress in treatment and Medication management   Ms. Barbary is a 22 year old female with a history of schizoaffective disorder admitted for auditory hallucinations and paranoia in the context of medication noncompliance.  #Agitation -Haldol 5 mg, Ativan 2 mg and Benadryl 50 mg IM are available  #Schizoaffective disorder -increase Lithium to 750 mg BID, Li level 0.55 on 8/6 -continueInvega 9 mg daily -Invega sustenna injection234 mggiven on Thursday -give second dose of Invega sustenna 156 mg -start Clozapine 50 mg nightly   #Anxiety -continueNeurontin 300 mg TID -stop Vistaril -start Clonazepam 0.5 mg TID  #Cannabis use disorder -minimizes problems and declines treatment  #Smoking cessation -nicotine patch is availabe  #Labs -lipid panel, TSH, [facility] normal -EKGreviewed, NSR with QTc of 420 -pregnancy test is negative  #Disposition -discharge to home with her parents -needs [facility] private [facility] -needs a guardian and disability -will ask for 90 days of outpatient commitment   Kristine Linea, MD 05/19/2018, 5:46 PM

## 2018-05-19 NOTE — Progress Notes (Signed)
Recreation Therapy Notes  Date: 05/19/2018  Time: 9:30 am   Location: Craft Room   Behavioral response: N/A   Intervention Topic: Time Management  Discussion/Intervention: Patient did not attend group.   Clinical Observations/Feedback:  Patient did not attend group.   Kabrea Seeney LRT/CTRS        Rashon Rezek 05/19/2018 11:26 AM

## 2018-05-19 NOTE — [facility] Group Notes (Signed)
LCSW Group Therapy Note 05/19/2018 9:00 AM  Type of Therapy and Topic:  Group Therapy:  Setting Goals  Participation Level:  Did Not Attend  Description of Group: In this process group, patients discussed using strengths to work toward goals and address challenges.  Patients identified two positive things about themselves and one goal they were working on.  Patients were given the opportunity to share openly and support each other's plan for self-empowerment.  The group discussed the value of gratitude and were encouraged to have a daily reflection of positive characteristics or circumstances.  Patients were encouraged to identify a plan to utilize their strengths to work on current challenges and goals.  Therapeutic Goals 1. Patient will verbalize personal strengths/positive qualities and relate how these can assist with achieving desired personal goals 2. Patients will verbalize affirmation of peers plans for personal change and goal setting 3. Patients will explore the value of gratitude and positive focus as related to successful achievement of goals 4. Patients will verbalize a plan for regular reinforcement of personal positive qualities and circumstances.  Summary of Patient Progress:  Kimberly Boyle was invited to today's group, but chose not to attend.     Therapeutic Modalities Cognitive Behavioral Therapy Motivational Interviewing    Kimberly Boyle, [place]CS[place] 05/19/2018 12:00 PM

## 2018-05-19 NOTE — Progress Notes (Signed)
Received Kimberly Boyle this AM in her room asleep,she slept through breakfast and lunch. She got up at 1330 and requested an Ensure and stated she did not hear the call for the meals. She was told the staff came into her room and woke her for meals and she responded to them. This writer woke her for medication administration, she responded to this Clinical research associatewriter, but did not get up. The medications were taken to her room with her Invega injection. Her hygeine remains poor and her room is extremely untidy. She got up to eat dinner and afterwards requested an ensure.

## 2018-05-19 NOTE — Progress Notes (Signed)
Clozapine monitoring Consult   22 yo female ordered clozapine 50 mg PO at bedtime  05/17/2018 [facility] 6300  Information entered into Clozapine registry and pt eligible to receive clozapine Next labs due in a week - ordered for 05/24/2018  Pharmacy will continue to follow.   Waldron LabsKaren K Raunel Dimartino, RPh 05/19/2018 10:19 AM

## 2018-05-20 MED ORDER — CLONAZEPAM 0.5 MG PO TABS: 0.5000 mg | ORAL_TABLET | Freq: Three times a day (TID) | ORAL | Status: DC | PRN

## 2018-05-20 NOTE — [facility] Group Notes (Signed)
[facility] Group Notes:  (Nursing/MHT/Case Management/Adjunct)  Date:  05/20/2018  Time:  4:20 AM  Type of Therapy:  Group Therapy  Participation Level:  Did Not Attend   Kimberly BeeJessica  Kimberly Boyle 05/20/2018, 4:20 AM

## 2018-05-20 NOTE — [facility] Group Notes (Signed)
05/20/2018 1PM  Type of Therapy and Topic:  Group Therapy:  Feelings around Relapse and Recovery  Participation Level:  Did Not Attend   Description of Group:    Patients in this group will discuss emotions they experience before and after a relapse. They will process how experiencing these feelings, or avoidance of experiencing them, relates to having a relapse. Facilitator will guide patients to explore emotions they have related to recovery. Patients will be encouraged to process which emotions are more powerful. They will be guided to discuss the emotional reaction significant others in their lives may have to patients' relapse or recovery. Patients will be assisted in exploring ways to respond to the emotions of others without this contributing to a relapse.  Therapeutic Goals: 1. Patient will identify two or more emotions that lead to a relapse for them 2. Patient will identify two emotions that result when they relapse 3. Patient will identify two emotions related to recovery 4. Patient will demonstrate ability to communicate their needs through discussion and/or role plays   Summary of Patient Progress: Patient was encouraged and invited to attend group. Patient did not attend group. Social worker will continue to encourage group participation in the future.      Therapeutic Modalities:   Cognitive Behavioral Therapy Solution-Focused Therapy Assertiveness Training Relapse Prevention Therapy   Johny ShearsCassandra  Kohner Orlick, LCSW 05/20/2018 2:16 PM

## 2018-05-20 NOTE — Progress Notes (Signed)
Recreation Therapy Notes  Date: 05/20/2018  Time: 9:30 am   Location: Craft Room   Behavioral response: N/A   Intervention Topic: Life Planning  Discussion/Intervention: Patient did not attend group.   Clinical Observations/Feedback:  Patient did not attend group.   Bev Drennen LRT/CTRS        Kimberly Boyle 05/20/2018 10:27 AM 

## 2018-05-20 NOTE — Progress Notes (Signed)
Received Kimberly Boyle this AM in the dinning room eating her breakfast and afterwards she got up for lunch. She was compliant with her medications and took them in the medication room. She was asked to shower and change her clothes, she was cmpliant.

## 2018-05-20 NOTE — Plan of Care (Signed)
Patient is not coping well with her medications, responding to her self with unknown sources, easily agitated and somatic behaviors, not socializing, isolate to her self, support and encouragement is provided, easily redirected, compliant with treatment regimen, contract for safety of self and others, 15 minute rounding is maintained no distress.  Problem: Education: Goal: Knowledge of [place] [facility] Education information/materials will improve Outcome: Progressing Goal: Mental status will improve Outcome: Progressing   Problem: Coping: Goal: Coping ability will improve Outcome: Progressing Goal: Will verbalize feelings Outcome: Progressing   Problem: Safety: Goal: Ability to remain free from injury will improve Outcome: Progressing   Problem: Self-Concept: Goal: Will verbalize positive feelings about self Outcome: Progressing   Problem: Education: Goal: Utilization of techniques to improve thought processes will improve Outcome: Progressing Goal: Knowledge of the prescribed therapeutic regimen will improve Outcome: Progressing   Problem: Activity: Goal: Interest or engagement in leisure activities will improve Outcome: Progressing Goal: Imbalance in normal sleep/wake cycle will improve Outcome: Progressing   Problem: Coping: Goal: Coping ability will improve Outcome: Progressing Goal: Will verbalize feelings Outcome: Progressing   Problem: Health Behavior/Discharge Planning: Goal: Ability to make decisions will improve Outcome: Progressing Goal: Compliance with therapeutic regimen will improve Outcome: Progressing   Problem: Role Relationship: Goal: Will demonstrate positive changes in social behaviors and relationships Outcome: Progressing   Problem: Safety: Goal: Ability to disclose and discuss suicidal ideas will improve Outcome: Progressing Goal: Ability to identify and utilize support systems that promote safety will improve Outcome: Progressing    Problem: Self-Concept: Goal: Will verbalize positive feelings about self Outcome: Progressing Goal: Level of anxiety will decrease Outcome: Progressing   Problem: Education: Goal: Knowledge of General Education information will improve Description Including pain rating scale, medication(s)/side effects and non-pharmacologic comfort measures Outcome: Progressing

## 2018-05-20 NOTE — Progress Notes (Signed)
[facility] MD Progress Note  05/20/2018 5:20 PM Archana Eckman  MRN:  [id number]  Subjective:   Ms. Furuya has bad bipolar. She has been in a hospital every month since January 2019. Here not improving enough on Invega. Started Clozapine titration. Mood more stable on depakote. Family seeking guardianship. Still hallucinates and gets agitated easily.  She took a shower today with encouragement for the first time today. She denies hallucinations but is mumbling under her breath. Her thinking is still very disorganized. She accepts medications, reports no side effects. Her mother visited today. They were drawaing pictures clumsilyas if 19-year-old.    Principal Problem: Schizoaffective disorder, bipolar type (HCC) Diagnosis:   Patient Active Problem List   Diagnosis Date Noted  . Schizoaffective disorder, bipolar type (HCC) [F25.0] 05/06/2018    Priority: High  . Tobacco use disorder [F17.200] 05/08/2018  . Suicide attempt (HCC) [T14.91XA] 08/20/2017  . Cannabis use disorder, moderate, dependence (HCC) [F12.20] 08/20/2017  . Self-inflicted laceration of wrist [S61.519A] 08/20/2017  . Acetaminophen poisoning [T39.1X1A] 08/20/2017  . Generalized anxiety disorder [F41.1] 01/05/2017  . Mood disorder (HCC) [F39] 01/05/2017   Total Time spent with patient: 20 minutes  Past Psychiatric History: schizophrenia  Past Medical History:  Past Medical History:  Diagnosis Date  . Anxiety   . Arthritis   . Bipolar disorder (HCC)   . Depression   . Major depressive disorder   . Panic disorder   . Schizophrenia (HCC)    History reviewed. No pertinent surgical history. Family History:  Family History  Problem Relation Age of Onset  . Post-traumatic stress disorder Mother   . Depression Mother   . Depression Maternal Uncle   . Cancer Maternal Grandmother    Family Psychiatric  History: none Social History:  Social History   Substance and Sexual Activity  Alcohol Use No  . Frequency: Never      Social History   Substance and Sexual Activity  Drug Use No  . Types: Marijuana    Social History   Socioeconomic History  . Marital status: Single    Spouse name: Not on file  . Number of children: 0  . Years of education: Not on file  . Highest education level: High school graduate  Occupational History    Comment: unemployed  Social Needs  . Financial resource strain: Not on file  . Food insecurity:    Worry: Not on file    Inability: Not on file  . Transportation needs:    Medical: No    Non-medical: No  Tobacco Use  . Smoking status: Current Every Day Smoker    Packs/day: 0.25    Types: Cigarettes  . Smokeless tobacco: Never Used  Substance and Sexual Activity  . Alcohol use: No    Frequency: Never  . Drug use: No    Types: Marijuana  . Sexual activity: Not Currently  Lifestyle  . Physical activity:    Days per week: 0 days    Minutes per session: 0 min  . Stress: To some extent  Relationships  . Social connections:    Talks on phone: Not on file    Gets together: Not on file    Attends religious service: Never    Active member of club or organization: No    Attends meetings of clubs or organizations: Never    Relationship status: Never married  Other Topics Concern  . Not on file  Social History Narrative   ** Merged History Encounter **  Additional Social History:                         Sleep: Fair  Appetite:  Fair  Current Medications: Current Facility-Administered Medications  Medication Dose Route Frequency Provider Last Rate Last Dose  . acetaminophen (TYLENOL) tablet 650 mg  650 mg Oral Q6H PRN [name], Jackquline DenmarkJohn T, MD   650 mg at 05/19/18 1943  . alum & mag hydroxide-simeth (MAALOX/MYLANTA) 200-200-20 MG/5ML suspension 30 mL  30 mL Oral Q4H PRN [name], [name], MD      . clonazePAM (KLONOPIN) tablet 0.5 mg  0.5 mg Oral TID AC Bertin Inabinet B, MD   0.5 mg at 05/20/18 1611  . cloZAPine (CLOZARIL) tablet 100 mg  100 mg  Oral QHS [name] B, MD   100 mg at 05/19/18 2058  . diphenhydrAMINE (BENADRYL) injection 50 mg  50 mg Intramuscular Q6H PRN [place] Monds B, MD   50 mg at 05/18/18 1706  . docusate sodium (COLACE) capsule 100 mg  100 mg Oral Daily Shaquon Gropp B, MD   100 mg at 05/20/18 0915  . feeding supplement (ENSURE ENLIVE) (ENSURE ENLIVE) liquid 237 mL  237 mL Oral TID BM Oswald Pott B, MD   237 mL at 05/19/18 1944  . gabapentin (NEURONTIN) capsule 300 mg  300 mg Oral TID Jahmir Salo B, MD   300 mg at 05/20/18 1611  . haloperidol lactate (HALDOL) injection 5 mg  5 mg Intramuscular Q6H PRN Floye Fesler B, MD   5 mg at 05/18/18 1706  . lithium carbonate capsule 750 mg  750 mg Oral BID WC Ethel Veronica B, MD   750 mg at 05/20/18 1611  . LORazepam (ATIVAN) injection 2 mg  2 mg Intramuscular Q6H PRN Keisy Strickler B, MD   2 mg at 05/18/18 1705  . magnesium hydroxide (MILK OF MAGNESIA) suspension 30 mL  30 mL Oral Daily PRN [name], [name], MD      . nicotine (NICODERM CQ - dosed in mg/24 hours) patch 21 mg  21 mg Transdermal Daily [name], Jackquline DenmarkJohn T, MD   21 mg at 05/18/18 0843  . paliperidone (INVEGA SUSTENNA) injection 234 mg  234 mg Intramuscular Q28 days Keath Matera B, MD   234 mg at 05/12/18 1737  . paliperidone (INVEGA) 24 hr tablet 9 mg  9 mg Oral Daily Amran Malter B, MD   9 mg at 05/20/18 [id number]    Lab Results: No results found for this or any previous visit (from the past 48 hour(s)).  Blood Alcohol level:  Lab Results  Component Value Date   ETH <10 05/05/2018   ETH <10 10/12/2017    Metabolic Disorder Labs: Lab Results  Component Value Date   HGBA1C 5.1 05/05/2018   MPG 99.67 05/05/2018   Lab Results  Component Value Date   PROLACTIN 132.8 (H) 10/25/2017   Lab Results  Component Value Date   CHOL 190 05/07/2018   TRIG 112 05/07/2018   HDL 44 05/07/2018   CHOLHDL 4.3 05/07/2018   VLDL 22 05/07/2018   LDLCALC 124  (H) 05/07/2018   LDLCALC 72 10/25/2017    Physical Findings: AIMS: Facial and Oral Movements Muscles of Facial Expression: None, normal Lips and Perioral Area: None, normal Jaw: None, normal Tongue: None, normal,Extremity Movements Upper (arms, wrists, hands, fingers): None, normal Lower (legs, knees, ankles, toes): None, normal, Trunk Movements Neck, shoulders, hips: None, normal, Overall Severity Severity of abnormal movements (highest  score from questions above): None, normal Incapacitation due to abnormal movements: None, normal Patient's awareness of abnormal movements (rate only patient's report): No Awareness, Dental Status Current problems with teeth and/or dentures?: No Does patient usually wear dentures?: No  CIWA:    COWS:     Musculoskeletal: Strength & Muscle Tone: within normal limits Gait & Station: normal Patient leans: N/A  Psychiatric Specialty Exam: Physical Exam  Nursing note and vitals reviewed. Psychiatric: Her affect is blunt. Her speech is delayed. She is slowed, withdrawn and actively hallucinating. Thought content is paranoid and delusional. Cognition and memory are impaired. She expresses impulsivity.    Review of Systems  Neurological: Negative.   Psychiatric/Behavioral: Positive for hallucinations.  All other systems reviewed and are negative.   Blood pressure 106/71, pulse 87, temperature 99.2 F (37.3 C), temperature source Oral, resp. rate 16, height 5\' 2"  (1.575 m), weight 63.5 kg, SpO2 96 %.Body mass index is 25.61 kg/m.  General Appearance: Disheveled  Eye Contact:  Minimal  Speech:  Clear and Coherent  Volume:  Decreased  Mood:  Euthymic  Affect:  Flat  Thought Process:  Disorganized and Descriptions of Associations: Tangential  Orientation:  Full (Time, Place, and Person)  Thought Content:  Delusions, Hallucinations: Auditory and Paranoid Ideation  Suicidal Thoughts:  No  Homicidal Thoughts:  No  Memory:  Immediate;    Poor Recent;   Poor Remote;   Poor  Judgement:  Poor  Insight:  Lacking  Psychomotor Activity:  Psychomotor Retardation  Concentration:  Concentration: Poor and Attention Span: Poor  Recall:  Poor  Fund of Knowledge:  Poor  Language:  Poor  Akathisia:  No  Handed:  Right  AIMS (if indicated):     Assets:  Communication Skills Desire for Improvement Financial Resources/Insurance Housing Physical Health Resilience Social Support  ADL's:  Intact  Cognition:  WNL  Sleep:  Number of Hours: 8.3     Treatment Plan Summary: Daily contact with patient to assess and evaluate symptoms and progress in treatment and Medication management   Ms. Yogi is a 22 year old female with a history of schizoaffective disorder admitted for auditory hallucinations and paranoia in the context of medication noncompliance. She has been improving very slowly and is currently on two antipsychotics.  #Agitation, resolved -Haldol 5 mg, Ativan 2 mg and Benadryl 50 mg IM are available  #Schizoaffective disorder, slow improvement -increase Lithium to 750 mg BID, Li level 0.55 on 8/6 -stop oral Invega -Invega sustenna 324 mg monthly injections, next dose on 8/28 -increase Clozapine to 100 mg on 8/9  #Anxiety -continueNeurontin 300 mg TID -continue Clonazepam 0.5 mg TID PRN  #Cannabis use disorder -minimizes problems and declines treatment  #Poor oral intake -Ensure TID  #Smoking cessation -nicotine patch is availabe  #Labs -lipid panel, TSH, [facility] normal -EKGreviewed, NSR with QTc of 420 -pregnancy test is negative  #Disposition -discharge to home with her parents -needs [facility] private [facility] -needs a guardian and disability -will ask for 90 days of outpatient commitment    Kristine Linea, MD 05/20/2018, 5:20 PM

## 2018-05-21 DIAGNOSIS — F17200 Nicotine dependence, unspecified, uncomplicated: Secondary | ICD-10-CM

## 2018-05-21 DIAGNOSIS — F1220 Cannabis dependence, uncomplicated: Secondary | ICD-10-CM

## 2018-05-21 MED ORDER — CLONAZEPAM 0.5 MG PO TABS
0.5000 mg | ORAL_TABLET | Freq: Two times a day (BID) | ORAL | Status: DC | PRN
  Administered 2018-05-22 – 2018-06-03 (×19): 0.5 mg via ORAL
  Filled 2018-05-21 (×19): qty 1

## 2018-05-21 MED ORDER — CLOZAPINE 25 MG PO TABS
150.0000 mg | ORAL_TABLET | Freq: Every day | ORAL | Status: DC
  Administered 2018-05-21 – 2018-05-22 (×2): 150 mg via ORAL
  Filled 2018-05-21 (×2): qty 1

## 2018-05-21 NOTE — Plan of Care (Signed)
Patient remains isolative to her room and currently denies depression/anxiety and pain at this time. Refused to eat breakfast and lunch but was encouraged by this writer to get up and eat dinner. Patient did however drank her Ensure this afternoon and stated, "Ok, I will get up and eat dinner later." Verbalized having a BM yesterday and has voided today per patient. Complaint with medications. Patient will be monitored per this Clinical research associatewriter.

## 2018-05-21 NOTE — [facility] Group Notes (Signed)
LCSW Group Therapy Note   05/21/2018 1:15pm   Type of Therapy and Topic:  Group Therapy:  Trust and Honesty  Participation Level:  Did Not Attend  Description of Group:    In this group patients will be asked to explore the value of being honest.  Patients will be guided to discuss their thoughts, feelings, and behaviors related to honesty and trusting in others. Patients will process together how trust and honesty relate to forming relationships with peers, family members, and self. Each patient will be challenged to identify and express feelings of being vulnerable. Patients will discuss reasons why people are dishonest and identify alternative outcomes if one was truthful (to self or others). This group will be process-oriented, with patients participating in exploration of their own experiences, giving and receiving support, and processing challenge from other group members.   Therapeutic Goals: 1. Patient will identify why honesty is important to relationships and how honesty overall affects relationships.  2. Patient will identify a situation where they lied or were lied too and the  feelings, thought process, and behaviors surrounding the situation 3. Patient will identify the meaning of being vulnerable, how that feels, and how that correlates to being honest with self and others. 4. Patient will identify situations where they could have told the truth, but instead lied and explain reasons of dishonesty.   Summary of Patient Progress: Pt was invited to attend group but chose not to attend. CSW will continue to encourage pt to attend group throughout their admission.     Therapeutic Modalities:   Cognitive Behavioral Therapy Solution Focused Therapy Motivational Interviewing Brief Therapy  Jeaneane Adamec  CUEBAS-COLON, LCSW 05/21/2018 12:46 PM  

## 2018-05-21 NOTE — Progress Notes (Signed)
[facility] MD Progress Note  05/21/2018 9:43 AM Kimberly Boyle  MRN:  [id number]  Subjective:   Kimberly Boyle has bad bipolar. She has been in a hospital every month since January 2019. Here not improving enough on Invega. Started Clozapine titration. Mood more stable on depakote. Family seeking guardianship. Still hallucinates and gets agitated easily.    The patient remains isolated to her room.  She did take a shower this week but otherwise hygiene has remained poor.  She does not attend any groups or interact with peers on the unit.  Insight and judgment remain poor.  Per nursing, she has been responding to internal stimuli and remains paranoid and delusional, hallucinating and talking to herself at times.  The patient denied any current suicidal thoughts when she was assessed today.  She slept 6 hours last night but is sleeping on and off throughout the daytime.  Vital signs are stable.  She says her parents have been coming to visit her every day.  No somatic complaints.  She is tolerating psychotropic medications fairly well without any adverse side effects.  Past Psychiatric History:  Past psych Dx: schizoaffective, multiple personality, Cannabis abuse.  Past psych hospitalization: 5x inpatient hospitalizations in 2019 alone, most recently one in [facility] in June 2019.    - [facility] in March- April 2019 and discharge medications included lithium, tegretol, cogentin, trazodone, hydroxyzine and Risperdal.    [facility] for bizarre behaviors, and was discharged on 12/07/17, on lithium, Tegretol, risperidone, Remeron and Vistaril She has had at least 10 hospitalizations total, mostly for psychosis. Pt has hx of having CAH (commtenary auditory hallucinations, telling her "they are doing to kill her")  Pt has hx of assaulting her mom, and might be still on probation for that assault charge.   Principal Problem: Schizoaffective disorder, bipolar type (HCC)     Diagnosis:   Patient Active Problem  List   Diagnosis Date Noted  . Tobacco use disorder [F17.200] 05/08/2018  . Schizoaffective disorder, bipolar type (HCC) [F25.0] 05/06/2018  . Suicide attempt (HCC) [T14.91XA] 08/20/2017  . Cannabis use disorder, moderate, dependence (HCC) [F12.20] 08/20/2017  . Self-inflicted laceration of wrist [S61.519A] 08/20/2017  . Acetaminophen poisoning [T39.1X1A] 08/20/2017  . Generalized anxiety disorder [F41.1] 01/05/2017  . Mood disorder (HCC) [F39] 01/05/2017   Total Time spent with patient: 20 minutes  Past Psychiatric History: schizophrenia  Past Medical History:  Past Medical History:  Diagnosis Date  . Anxiety   . Arthritis   . Bipolar disorder (HCC)   . Depression   . Major depressive disorder   . Panic disorder   . Schizophrenia (HCC)    History reviewed. No pertinent surgical history. Family History:  Family History  Problem Relation Age of Onset  . Post-traumatic stress disorder Mother   . Depression Mother   . Depression Maternal Uncle   . Cancer Maternal Grandmother    Family Psychiatric  History: none Social History:  Social History   Substance and Sexual Activity  Alcohol Use No  . Frequency: Never     Social History   Substance and Sexual Activity  Drug Use No  . Types: Marijuana    Social History   Socioeconomic History  . Marital status: Single    Spouse name: Not on file  . Number of children: 0  . Years of education: Not on file  . Highest education level: High school graduate  Occupational History    Comment: unemployed  Social Needs  . Financial  resource strain: Not on file  . Food insecurity:    Worry: Not on file    Inability: Not on file  . Transportation needs:    Medical: No    Non-medical: No  Tobacco Use  . Smoking status: Current Every Day Smoker    Packs/day: 0.25    Types: Cigarettes  . Smokeless tobacco: Never Used  Substance and Sexual Activity  . Alcohol use: No    Frequency: Never  . Drug use: No    Types:  Marijuana  . Sexual activity: Not Currently  Lifestyle  . Physical activity:    Days per week: 0 days    Minutes per session: 0 min  . Stress: To some extent  Relationships  . Social connections:    Talks on phone: Not on file    Gets together: Not on file    Attends religious service: Never    Active member of club or organization: No    Attends meetings of clubs or organizations: Never    Relationship status: Never married  Other Topics Concern  . Not on file  Social History Narrative   ** Merged History Encounter **       Additional Social History:     Sleep: Fair; sleeping throughout the daytime  Appetite:  Fair - does not go to  Breakfast but does go to lunch and dinner yesterday  Current Medications: Current Facility-Administered Medications  Medication Dose Route Frequency Provider Last Rate Last Dose  . acetaminophen (TYLENOL) tablet 650 mg  650 mg Oral Q6H PRN [name], Jackquline DenmarkJohn T, MD   650 mg at 05/20/18 2108  . alum & mag hydroxide-simeth (MAALOX/MYLANTA) 200-200-20 MG/5ML suspension 30 mL  30 mL Oral Q4H PRN [name], [name], MD      . clonazePAM (KLONOPIN) tablet 0.5 mg  0.5 mg Oral TID PRN Pucilowska, [name], MD      . cloZAPine (CLOZARIL) tablet 100 mg  100 mg Oral QHS Pucilowska, [name], MD   100 mg at 05/20/18 2107  . diphenhydrAMINE (BENADRYL) injection 50 mg  50 mg Intramuscular Q6H PRN Pucilowska, [name], MD   50 mg at 05/18/18 1706  . docusate sodium (COLACE) capsule 100 mg  100 mg Oral Daily Pucilowska, [name], MD   100 mg at 05/21/18 0835  . feeding supplement (ENSURE ENLIVE) (ENSURE ENLIVE) liquid 237 mL  237 mL Oral TID BM Pucilowska, [name], MD   237 mL at 05/20/18 2108  . gabapentin (NEURONTIN) capsule 300 mg  300 mg Oral TID Pucilowska, [name], MD   300 mg at 05/21/18 0835  . haloperidol lactate (HALDOL) injection 5 mg  5 mg Intramuscular Q6H PRN Pucilowska, [name], MD   5 mg at 05/18/18 1706  . lithium carbonate capsule 750 mg  750  mg Oral BID WC Pucilowska, [name], MD   750 mg at 05/21/18 0835  . LORazepam (ATIVAN) injection 2 mg  2 mg Intramuscular Q6H PRN Pucilowska, [name], MD   2 mg at 05/18/18 1705  . magnesium hydroxide (MILK OF MAGNESIA) suspension 30 mL  30 mL Oral Daily PRN [name], [name], MD      . nicotine (NICODERM CQ - dosed in mg/24 hours) patch 21 mg  21 mg Transdermal Daily [name], Jackquline DenmarkJohn T, MD   21 mg at 05/21/18 0835  . paliperidone (INVEGA SUSTENNA) injection 234 mg  234 mg Intramuscular Q28 days Pucilowska, [name], MD   234 mg at 05/12/18 1737  Lab Results: No results found for this or any previous visit (from the past 48 hour(s)).  Blood Alcohol level:  Lab Results  Component Value Date   ETH <10 05/05/2018   ETH <10 10/12/2017    Metabolic Disorder Labs: Lab Results  Component Value Date   HGBA1C 5.1 05/05/2018   MPG 99.67 05/05/2018   Lab Results  Component Value Date   PROLACTIN 132.8 (H) 10/25/2017   Lab Results  Component Value Date   CHOL 190 05/07/2018   TRIG 112 05/07/2018   HDL 44 05/07/2018   CHOLHDL 4.3 05/07/2018   VLDL 22 05/07/2018   LDLCALC 124 (H) 05/07/2018   LDLCALC 72 10/25/2017    Physical Findings: AIMS: Facial and Oral Movements Muscles of Facial Expression: None, normal Lips and Perioral Area: None, normal Jaw: None, normal Tongue: None, normal,Extremity Movements Upper (arms, wrists, hands, fingers): None, normal Lower (legs, knees, ankles, toes): None, normal, Trunk Movements Neck, shoulders, hips: None, normal, Overall Severity Severity of abnormal movements (highest score from questions above): None, normal Incapacitation due to abnormal movements: None, normal Patient's awareness of abnormal movements (rate only patient's report): No Awareness, Dental Status Current problems with teeth and/or dentures?: No Does patient usually wear dentures?: No  CIWA:    COWS:     Musculoskeletal: Strength & Muscle Tone: within normal  limits Gait & Station: normal Patient leans: N/A  Psychiatric Specialty Exam: Physical Exam  Nursing note and vitals reviewed. Psychiatric: Her affect is blunt. Her speech is delayed. She is slowed, withdrawn and actively hallucinating. Thought content is paranoid and delusional. Cognition and memory are impaired. She expresses impulsivity.    Review of Systems  Constitutional: Negative.   HENT: Negative.   Eyes: Negative.   Respiratory: Negative.   Cardiovascular: Negative.   Gastrointestinal: Negative.   Skin: Negative.   Neurological: Negative.   Psychiatric/Behavioral: Positive for hallucinations.    Blood pressure 101/69, pulse 80, temperature 97.9 F (36.6 C), resp. rate 18, height 5\' 2"  (1.575 m), weight 63.5 kg, SpO2 98 %.Body mass index is 25.61 kg/m.  General Appearance: Disheveled  Eye Contact:  Minimal  Speech:  Mumbled and slow  Volume:  Decreased  Mood:  "I am fine"  Affect:  Blunt  Thought Process:  Paucity of ideas  Orientation:  Full (Time, Place, and Person)  Thought Content:  Delusions, Hallucinations: Auditory and Paranoid Ideation  Suicidal Thoughts:  No  Homicidal Thoughts:  No  Memory:  Immediate;   Poor Recent;   Poor Remote;   Poor  Judgement:  Poor  Insight:  Lacking  Psychomotor Activity:  Psychomotor Retardation  Concentration:  Concentration: Poor and Attention Span: Poor  Recall:  Poor  Fund of Knowledge:  Poor  Language:  Poor  Akathisia:  No  Handed:  Right  AIMS (if indicated):     Assets:  Communication Skills Desire for Improvement Financial Resources/Insurance Housing Physical Health Resilience Social Support  ADL's:  Intact  Cognition:  WNL  Sleep:  Number of Hours: 6     Treatment Plan Summary: Daily contact with patient to assess and evaluate symptoms and progress in treatment and Medication management   Ms. Hemminger is a 22 year old female with a history of schizoaffective disorder admitted for auditory hallucinations  and paranoia in the context of medication noncompliance. She has been improving very slowly and is currently on two antipsychotics.  #Agitation, resolved -Haldol 5 mg, Ativan 2 mg and Benadryl 50 mg IM are available  #Schizoaffective disorder, slow  improvement -Lithium was increased to a total of 750 mg p.o. twice daily.  Lithium level was 0.55 on 05/17/18 -She is no longer oral Inveag but will continue [place] injections  -Invega sustenna 324 mg monthly injections, next dose on 8/28 -Clozarol will be increased to 150mg  po daily   #Anxiety -continueNeurontin 300 mg TID -decrease Clonazepam to 0.5 mg BID PRN doing   #Cannabis use disorder -minimizes problems and declines treatment -She was advised to abstain from marijuana and all illicit drugs as they may worsen psychosis  Diet #Poor oral intake -Ensure TID  #Smoking cessation -nicotine patch is availabe  #Labs -lipid panel, TSH, [facility] normal -EKGreviewed, NSR with QTc of 420 -pregnancy test is negative  #Disposition -discharge to home with her parents -needs [facility] private [facility] -needs a guardian and disability -will ask for 90 days of outpatient commitment    Darliss Ridgel, MD 05/21/2018, 9:43 AM

## 2018-05-21 NOTE — Plan of Care (Signed)
Patient had a bath and change of cloths,, and able to visit with mother early in the shift, patient had a good day, and compliant with safety and her medical regimen, sleep,long hour with out any interruptions, denies SI HI , voice no complains, 15 minute safety rounding is in progress.   Problem: Education: Goal: Knowledge of [facility] Education information/materials will improve Outcome: Progressing Goal: Mental status will improve Outcome: Progressing   Problem: Coping: Goal: Coping ability will improve Outcome: Progressing Goal: Will verbalize feelings Outcome: Progressing   Problem: Safety: Goal: Ability to remain free from injury will improve Outcome: Progressing   Problem: Self-Concept: Goal: Will verbalize positive feelings about self Outcome: Progressing   Problem: Education: Goal: Utilization of techniques to improve thought processes will improve Outcome: Progressing Goal: Knowledge of the prescribed therapeutic regimen will improve Outcome: Progressing   Problem: Activity: Goal: Interest or engagement in leisure activities will improve Outcome: Progressing Goal: Imbalance in normal sleep/wake cycle will improve Outcome: Progressing   Problem: Coping: Goal: Coping ability will improve Outcome: Progressing Goal: Will verbalize feelings Outcome: Progressing   Problem: Health Behavior/Discharge Planning: Goal: Ability to make decisions will improve Outcome: Progressing Goal: Compliance with therapeutic regimen will improve Outcome: Progressing   Problem: Role Relationship: Goal: Will demonstrate positive changes in social behaviors and relationships Outcome: Progressing   Problem: Safety: Goal: Ability to disclose and discuss suicidal ideas will improve Outcome: Progressing Goal: Ability to identify and utilize support systems that promote safety will improve Outcome: Progressing   Problem: Self-Concept: Goal: Will verbalize positive  feelings about self Outcome: Progressing Goal: Level of anxiety will decrease Outcome: Progressing   Problem: Education: Goal: Knowledge of General Education information will improve Description Including pain rating scale, medication(s)/side effects and non-pharmacologic comfort measures Outcome: Progressing

## 2018-05-22 NOTE — [facility] Group Notes (Signed)
[facility] Group Notes:  (Nursing/MHT/Case Management/Adjunct)  Date:  05/22/2018  Time:  3:27 AM  Type of Therapy:  Group Therapy  Participation Level:  Did Not Attend  Kimberly BeeJessica  Kimberly Boyle 05/22/2018, 3:27 AM

## 2018-05-22 NOTE — Plan of Care (Signed)
  Problem: Safety: Goal: Ability to remain free from injury will improve Outcome: Progressing   Problem: Education: Goal: Mental status will improve Outcome: Not Progressing   Problem: Coping: Goal: Will verbalize feelings Outcome: Not Progressing   Problem: Activity: Goal: Interest or engagement in leisure activities will improve Outcome: Not Progressing Goal: Imbalance in normal sleep/wake cycle will improve Outcome: Not Progressing Patient isolative to bed and room. Refused breakfast and lunch, but drank ensure. Minimal interaction with staff. Forwards little. No interaction with peers. Stayed in room most of day. Flat affect.

## 2018-05-22 NOTE — Progress Notes (Signed)
Patient did come down to dayroom and ate her lunch and drank another ensure.

## 2018-05-22 NOTE — Progress Notes (Signed)
Patient with sad, flat affect. Denies SI, HI, AVH. Medication compliant. Isolative to self and room. Stayed in bed most of morning. Did not come out for meals. Only drank ensure. No complaints of pain.  Encouragement and support offered. Safety checks maintained.  Patient receptive and remains safe on unit with q 15 min checks.

## 2018-05-22 NOTE — Plan of Care (Signed)
Visible in the milieu, currently visiting with her mother. Compliant with treatment

## 2018-05-22 NOTE — Plan of Care (Signed)
Still unable to program and remain focused in group. Pacing, anxious

## 2018-05-22 NOTE — Progress Notes (Signed)
Patient was visible in the milieu, anxious, restless and needy. Frequently coming to the nurses station. Disheveled with poor hygiene: refused to shower. Patient unable to program in groups. Walks frequently, in and out of the room. Received HS medications. Emotional support provided.Currently in bed sleeping and safety precautions maintained.

## 2018-05-22 NOTE — Progress Notes (Signed)
Patient is visible in the milieu. Currently visiting with her mother. Alert and oriented. Frequently coming to the nurses station, requesting ensure, requesting other services. Denying thoughts of self harm. Denying hallucinations. Staff continue to provide support and encouragements. Safety precautions maintained.

## 2018-05-22 NOTE — Progress Notes (Signed)
[facility] MD Progress Note  05/22/2018 4:05 PM Kimberly Boyle  MRN:  [id number]  Subjective:   Kimberly Boyle has bad bipolar. She has been in a hospital every month since January 2019. Here not improving enough on Invega. Started Clozapine titration. Mood more stable on depakote. Family seeking guardianship. Still hallucinates and gets agitated easily.  The patient does not appear to be actively hallucinating per nursing.  She says that when she does talk to herself, she talks to herself because she feels conversations with her self or more interesting and with others.  She has been getting out of her room today.  She did not eat breakfast but did get up for lunch.  Appetite is poor and she does not eat all of her meals.  She does have Ensure 3 times daily she slept well last night but has been sleeping on and off throughout the daytime as well.  She denies any current active or passive suicidal thoughts or psychotic symptoms but says her anxiety remains high.  Thought processes are tangential in speech is somewhat pressured.  At times, thoughts became disorganized.  She denies any somatic complaints. She has not been attending groups.  Vital signs are stable.   Past Psychiatric History:  Past psych Dx: schizoaffective, multiple personality, Cannabis abuse.  Past psych hospitalization: 5x inpatient hospitalizations in 2019 alone, most recently one in [facility] in June 2019.    - [name] in March- April 2019 and discharge medications included lithium, tegretol, cogentin, trazodone, hydroxyzine and Risperdal.    Kimberly Boyle for bizarre behaviors, and was discharged on 12/07/17, on lithium, Tegretol, risperidone, Remeron and Vistaril She has had at least 10 hospitalizations total, mostly for psychosis. Pt has hx of having CAH (commtenary auditory hallucinations, telling her "they are doing to kill her")  Pt has hx of assaulting her mom, and might be still on probation for that assault charge.   Principal  Problem: Schizoaffective disorder, bipolar type (HCC)     Diagnosis:   Patient Active Problem List   Diagnosis Date Noted  . Tobacco use disorder [F17.200] 05/08/2018  . Schizoaffective disorder, bipolar type (HCC) [F25.0] 05/06/2018  . Suicide attempt (HCC) [T14.91XA] 08/20/2017  . Cannabis use disorder, moderate, dependence (HCC) [F12.20] 08/20/2017  . Self-inflicted laceration of wrist [S61.519A] 08/20/2017  . Acetaminophen poisoning [T39.1X1A] 08/20/2017  . Generalized anxiety disorder [F41.1] 01/05/2017  . Mood disorder (HCC) [F39] 01/05/2017   Total Time spent with patient: 20 minutes  Past Psychiatric History: schizophrenia  Past Medical History:  Past Medical History:  Diagnosis Date  . Anxiety   . Arthritis   . Bipolar disorder (HCC)   . Depression   . Major depressive disorder   . Panic disorder   . Schizophrenia (HCC)    History reviewed. No pertinent surgical history. Family History:  Family History  Problem Relation Age of Onset  . Post-traumatic stress disorder Mother   . Depression Mother   . Depression Maternal Uncle   . Cancer Maternal Grandmother    Family Psychiatric  History: none Social History:  Social History   Substance and Sexual Activity  Alcohol Use No  . Frequency: Never     Social History   Substance and Sexual Activity  Drug Use No  . Types: Marijuana    Social History   Socioeconomic History  . Marital status: Single    Spouse name: Not on file  . Number of children: 0  . Years of education: Not on file  .  Highest education level: High school graduate  Occupational History    Comment: unemployed  Social Needs  . Financial resource strain: Not on file  . Food insecurity:    Worry: Not on file    Inability: Not on file  . Transportation needs:    Medical: No    Non-medical: No  Tobacco Use  . Smoking status: Current Every Day Smoker    Packs/day: 0.25    Types: Cigarettes  . Smokeless tobacco: Never Used   Substance and Sexual Activity  . Alcohol use: No    Frequency: Never  . Drug use: No    Types: Marijuana  . Sexual activity: Not Currently  Lifestyle  . Physical activity:    Days per week: 0 days    Minutes per session: 0 min  . Stress: To some extent  Relationships  . Social connections:    Talks on phone: Not on file    Gets together: Not on file    Attends religious service: Never    Active member of club or organization: No    Attends meetings of clubs or organizations: Never    Relationship status: Never married  Other Topics Concern  . Not on file  Social History Narrative   ** Merged History Encounter **       Additional Social History:     Sleep: Fair; sleeping throughout the daytime  Appetite:  Fair - does not go to  Breakfast but does go to lunch and dinner yesterday  Current Medications: Current Facility-Administered Medications  Medication Dose Route Frequency Provider Last Rate Last Dose  . acetaminophen (TYLENOL) tablet 650 mg  650 mg Oral Q6H PRN [name], Jackquline DenmarkJohn T, MD   650 mg at 05/20/18 2108  . alum & mag hydroxide-simeth (MAALOX/MYLANTA) 200-200-20 MG/5ML suspension 30 mL  30 mL Oral Q4H PRN [name], [name], MD      . clonazePAM Scarlette Calico(KLONOPIN) tablet 0.5 mg  0.5 mg Oral BID PRN Darliss RidgelKapur, Alawna Graybeal K, MD   0.5 mg at 05/22/18 1602  . cloZAPine (CLOZARIL) tablet 150 mg  150 mg Oral QHS Darliss RidgelKapur, Joseangel Nettleton K, MD   150 mg at 05/21/18 2124  . diphenhydrAMINE (BENADRYL) injection 50 mg  50 mg Intramuscular Q6H PRN Pucilowska, [name], MD   50 mg at 05/18/18 1706  . docusate sodium (COLACE) capsule 100 mg  100 mg Oral Daily Pucilowska, [name], MD   100 mg at 05/22/18 0835  . feeding supplement (ENSURE ENLIVE) (ENSURE ENLIVE) liquid 237 mL  237 mL Oral TID BM Pucilowska, [name], MD   237 mL at 05/22/18 1406  . gabapentin (NEURONTIN) capsule 300 mg  300 mg Oral TID Pucilowska, [name], MD   300 mg at 05/22/18 1603  . haloperidol lactate (HALDOL) injection 5 mg  5 mg  Intramuscular Q6H PRN Pucilowska, [name], MD   5 mg at 05/18/18 1706  . lithium carbonate capsule 750 mg  750 mg Oral BID WC Pucilowska, [name], MD   750 mg at 05/22/18 1603  . LORazepam (ATIVAN) injection 2 mg  2 mg Intramuscular Q6H PRN Pucilowska, [name], MD   2 mg at 05/18/18 1705  . magnesium hydroxide (MILK OF MAGNESIA) suspension 30 mL  30 mL Oral Daily PRN [name], [name], MD      . nicotine (NICODERM CQ - dosed in mg/24 hours) patch 21 mg  21 mg Transdermal Daily [name], Jackquline DenmarkJohn T, MD   21 mg at 05/22/18 0834  .  paliperidone (INVEGA SUSTENNA) injection 234 mg  234 mg Intramuscular Q28 days Pucilowska, [name], MD   234 mg at 05/12/18 1737    Lab Results: No results found for this or any previous visit (from the past 48 hour(s)).  Blood Alcohol level:  Lab Results  Component Value Date   ETH <10 05/05/2018   ETH <10 10/12/2017    Metabolic Disorder Labs: Lab Results  Component Value Date   HGBA1C 5.1 05/05/2018   MPG 99.67 05/05/2018   Lab Results  Component Value Date   PROLACTIN 132.8 (H) 10/25/2017   Lab Results  Component Value Date   CHOL 190 05/07/2018   TRIG 112 05/07/2018   HDL 44 05/07/2018   CHOLHDL 4.3 05/07/2018   VLDL 22 05/07/2018   LDLCALC 124 (H) 05/07/2018   LDLCALC 72 10/25/2017    Physical Findings: AIMS: Facial and Oral Movements Muscles of Facial Expression: None, normal Lips and Perioral Area: None, normal Jaw: None, normal Tongue: None, normal,Extremity Movements Upper (arms, wrists, hands, fingers): None, normal Lower (legs, knees, ankles, toes): None, normal, Trunk Movements Neck, shoulders, hips: None, normal, Overall Severity Severity of abnormal movements (highest score from questions above): None, normal Incapacitation due to abnormal movements: None, normal Patient's awareness of abnormal movements (rate only patient's report): No Awareness, Dental Status Current problems with teeth and/or dentures?: No Does patient  usually wear dentures?: No  CIWA:    COWS:     Musculoskeletal: Strength & Muscle Tone: within normal limits Gait & Station: normal Patient leans: N/A  Psychiatric Specialty Exam: Physical Exam  Nursing note and vitals reviewed. Psychiatric: Her affect is blunt. Her speech is delayed. She is slowed, withdrawn and actively hallucinating. Thought content is paranoid and delusional. Cognition and memory are impaired. She expresses impulsivity.    Review of Systems  Constitutional:       Poor hygiene  HENT: Negative.   Eyes: Negative.   Respiratory: Negative.   Cardiovascular: Negative.   Gastrointestinal: Negative.   Skin: Negative.   Neurological: Negative.   Psychiatric/Behavioral: Positive for hallucinations.    Blood pressure 117/78, pulse 94, temperature 97.7 F (36.5 C), temperature source Oral, resp. rate 16, height 5\' 2"  (1.575 m), weight 63.5 kg, SpO2 96 %.Body mass index is 25.61 kg/m.  General Appearance: Disheveled, amlodorus  Eye Contact: Improved  Speech:  More clear today, coherent but pressured  Volume:  Decreased  Mood:  "OK"  Affect:  Blunt  Thought Process:  Tangential and disoragnized at times  Orientation:  Full (Time, Place, and Person)  Thought Content:  She denies any AH or VH  Suicidal Thoughts:  No  Homicidal Thoughts:  No  Memory:  Immediate;   Poor Recent;   Poor Remote;   Poor  Judgement:  Poor  Insight:  Lacking  Psychomotor Activity:  Psychomotor Retardation  Concentration:  Concentration: Poor and Attention Span: Poor  Recall:  Poor  Fund of Knowledge:  Poor  Language:  Poor  Akathisia:  No  Handed:  Right  AIMS (if indicated):     Assets:  Communication Skills Desire for Improvement Financial Resources/Insurance Housing Physical Health Resilience Social Support  ADL's:  Intact  Cognition:  WNL  Sleep:  Number of Hours: 8     Treatment Plan Summary: Daily contact with patient to assess and evaluate symptoms and progress  in treatment and Medication management   Kimberly Boyle is a 22 year old female with a history of schizoaffective disorder admitted for auditory hallucinations and paranoia  in the context of medication noncompliance. She has been improving very slowly and is currently on two antipsychotics.  #Agitation, resolved -Haldol 5 mg, Ativan 2 mg and Benadryl 50 mg IM are available  #Schizoaffective disorder, slow improvement -Patient is more alert and responsive today, coming out for meals.  Hygiene is still extremely poor -Lithium was increased to a total of 750 mg p.o. twice daily.  Lithium level was 0.55 on 8/6 -She is no longer on oral in [place] but will continue in [place] sustain injections with 325 mg monthly, next dose 8/28 -Clozaril was increased to a total of 150 mg p.o. nightly on 05/21/18   #Anxiety -continueNeurontin 300 mg TID -decrease Clonazepam to 0.5 mg BID PRN doing   #Cannabis use disorder -She minimizes the use of marijuana and is not interested in any substance abuse treatment -She was advised to abstain from marijuana and all illicit drugs as they may worsen psychosis  Diet #Poor oral intake -Ensure TID  #Smoking cessation -nicotine patch is availabe  #Labs -lipid panel, TSH, [facility] normal -EKGreviewed, NSR with QTc of 420 -pregnancy test is negative  #Disposition -discharge to home with her parents -needs follow up with [facility] private [facility] -needs a guardian and disability -will ask for 90 days of outpatient commitment    Darliss Ridgel, MD 05/22/2018, 4:05 PM

## 2018-05-22 NOTE — [facility] Group Notes (Signed)
LCSW Group Therapy Note 05/22/2018 1:15pm  Type of Therapy and Topic: Group Therapy: Feelings Around Returning Home & Establishing a Supportive Framework and Supporting Oneself When Supports Not Available  Participation Level: Did Not Attend  Description of Group:  Patients first processed thoughts and feelings about upcoming discharge. These included fears of upcoming changes, lack of change, new living environments, judgements and expectations from others and overall stigma of mental health issues. The group then discussed the definition of a supportive framework, what that looks and feels like, and how do to discern it from an unhealthy non-supportive network. The group identified different types of supports as well as what to do when your family/friends are less than helpful or unavailable  Therapeutic Goals  1. Patient will identify one healthy supportive network that they can use at discharge. 2. Patient will identify one factor of a supportive framework and how to tell it from an unhealthy network. 3. Patient able to identify one coping skill to use when they do not have positive supports from others. 4. Patient will demonstrate ability to communicate their needs through discussion and/or role plays.  Summary of Patient Progress:  Pt was invited to attend group but chose not to attend. CSW will continue to encourage pt to attend group throughout their admission.   Therapeutic Modalities Cognitive Behavioral Therapy Motivational Interviewing   Soren Lazarz  CUEBAS-COLON, LCSW 05/22/2018 12:01 PM 

## 2018-05-23 MED ORDER — CLOZAPINE 100 MG PO TABS
200.0000 mg | ORAL_TABLET | Freq: Every day | ORAL | Status: DC
  Administered 2018-05-23 – 2018-05-24 (×2): 200 mg via ORAL
  Filled 2018-05-23 (×2): qty 2

## 2018-05-23 NOTE — Progress Notes (Signed)
[facility] MD Progress Note  05/23/2018 3:23 PM Kimberly Boyle  MRN:  [id number]  Subjective:    [name] slept through breakfast and lunch. She is hungry now. She reports very poor sleep last night due to "restless legs". She does not display any akathisia at the moment but had similar symptoms in the past. There are no other somatic complaints. She adamantly denies any symptoms of depression, anxiety or psychosis. Asking for discharge.  Principal Problem: Schizoaffective disorder, bipolar type (HCC) Diagnosis:   Patient Active Problem List   Diagnosis Date Noted  . Schizoaffective disorder, bipolar type (HCC) [F25.0] 05/06/2018    Priority: High  . Tobacco use disorder [F17.200] 05/08/2018  . Suicide attempt (HCC) [T14.91XA] 08/20/2017  . Cannabis use disorder, moderate, dependence (HCC) [F12.20] 08/20/2017  . Self-inflicted laceration of wrist [S61.519A] 08/20/2017  . Acetaminophen poisoning [T39.1X1A] 08/20/2017  . Generalized anxiety disorder [F41.1] 01/05/2017  . Mood disorder (HCC) [F39] 01/05/2017   Total Time spent with patient: 20 minutes  Past Psychiatric History: schizophrenia  Past Medical History:  Past Medical History:  Diagnosis Date  . Anxiety   . Arthritis   . Bipolar disorder (HCC)   . Depression   . Major depressive disorder   . Panic disorder   . Schizophrenia (HCC)    History reviewed. No pertinent surgical history. Family History:  Family History  Problem Relation Age of Onset  . Post-traumatic stress disorder Mother   . Depression Mother   . Depression Maternal Uncle   . Cancer Maternal Grandmother    Family Psychiatric  History: none Social History:  Social History   Substance and Sexual Activity  Alcohol Use No  . Frequency: Never     Social History   Substance and Sexual Activity  Drug Use No  . Types: Marijuana    Social History   Socioeconomic History  . Marital status: Single    Spouse name: Not on file  . Number of children:  0  . Years of education: Not on file  . Highest education level: High school graduate  Occupational History    Comment: unemployed  Social Needs  . Financial resource strain: Not on file  . Food insecurity:    Worry: Not on file    Inability: Not on file  . Transportation needs:    Medical: No    Non-medical: No  Tobacco Use  . Smoking status: Current Every Day Smoker    Packs/day: 0.25    Types: Cigarettes  . Smokeless tobacco: Never Used  Substance and Sexual Activity  . Alcohol use: No    Frequency: Never  . Drug use: No    Types: Marijuana  . Sexual activity: Not Currently  Lifestyle  . Physical activity:    Days per week: 0 days    Minutes per session: 0 min  . Stress: To some extent  Relationships  . Social connections:    Talks on phone: Not on file    Gets together: Not on file    Attends religious service: Never    Active member of club or organization: No    Attends meetings of clubs or organizations: Never    Relationship status: Never married  Other Topics Concern  . Not on file  Social History Narrative   ** Merged History Encounter **       Additional Social History:  Sleep: Poor  Appetite:  Poor  Current Medications: Current Facility-Administered Medications  Medication Dose Route Frequency Provider Last Rate Last Dose  . acetaminophen (TYLENOL) tablet 650 mg  650 mg Oral Q6H PRN [name], Jackquline DenmarkJohn T, MD   650 mg at 05/22/18 1830  . alum & mag hydroxide-simeth (MAALOX/MYLANTA) 200-200-20 MG/5ML suspension 30 mL  30 mL Oral Q4H PRN [name], [name], MD      . clonazePAM Scarlette Calico(KLONOPIN) tablet 0.5 mg  0.5 mg Oral BID PRN Darliss RidgelKapur, Aarti K, MD   0.5 mg at 05/22/18 1602  . cloZAPine (CLOZARIL) tablet 200 mg  200 mg Oral QHS Marlys Stegmaier B, MD      . diphenhydrAMINE (BENADRYL) injection 50 mg  50 mg Intramuscular Q6H PRN Tacha Manni B, MD   50 mg at 05/18/18 1706  . docusate sodium (COLACE) capsule 100 mg  100 mg  Oral Daily Nathifa Ritthaler B, MD   100 mg at 05/23/18 0816  . feeding supplement (ENSURE ENLIVE) (ENSURE ENLIVE) liquid 237 mL  237 mL Oral TID BM Duell Holdren B, MD   237 mL at 05/23/18 1030  . gabapentin (NEURONTIN) capsule 300 mg  300 mg Oral TID Kymberlyn Eckford B, MD   300 mg at 05/23/18 1240  . haloperidol lactate (HALDOL) injection 5 mg  5 mg Intramuscular Q6H PRN Shanecia Hoganson B, MD   5 mg at 05/18/18 1706  . lithium carbonate capsule 750 mg  750 mg Oral BID WC Maribel Luis B, MD   750 mg at 05/23/18 0816  . LORazepam (ATIVAN) injection 2 mg  2 mg Intramuscular Q6H PRN Calynn Ferrero B, MD   2 mg at 05/18/18 1705  . magnesium hydroxide (MILK OF MAGNESIA) suspension 30 mL  30 mL Oral Daily PRN [name], [name], MD      . nicotine (NICODERM CQ - dosed in mg/24 hours) patch 21 mg  21 mg Transdermal Daily [name], Jackquline DenmarkJohn T, MD   21 mg at 05/22/18 0834  . paliperidone (INVEGA SUSTENNA) injection 234 mg  234 mg Intramuscular Q28 days Ekansh Sherk B, MD   234 mg at 05/12/18 1737    Lab Results: No results found for this or any previous visit (from the past 48 hour(s)).  Blood Alcohol level:  Lab Results  Component Value Date   ETH <10 05/05/2018   ETH <10 10/12/2017    Metabolic Disorder Labs: Lab Results  Component Value Date   HGBA1C 5.1 05/05/2018   MPG 99.67 05/05/2018   Lab Results  Component Value Date   PROLACTIN 132.8 (H) 10/25/2017   Lab Results  Component Value Date   CHOL 190 05/07/2018   TRIG 112 05/07/2018   HDL 44 05/07/2018   CHOLHDL 4.3 05/07/2018   VLDL 22 05/07/2018   LDLCALC 124 (H) 05/07/2018   LDLCALC 72 10/25/2017    Physical Findings: AIMS: Facial and Oral Movements Muscles of Facial Expression: None, normal Lips and Perioral Area: None, normal Jaw: None, normal Tongue: None, normal,Extremity Movements Upper (arms, wrists, hands, fingers): None, normal Lower (legs, knees, ankles, toes): None, normal, Trunk  Movements Neck, shoulders, hips: None, normal, Overall Severity Severity of abnormal movements (highest score from questions above): None, normal Incapacitation due to abnormal movements: None, normal Patient's awareness of abnormal movements (rate only patient's report): No Awareness, Dental Status Current problems with teeth and/or dentures?: No Does patient usually wear dentures?: No  CIWA:    COWS:     Musculoskeletal: Strength & Muscle Tone: within  normal limits Gait & Station: normal Patient leans: N/A  Psychiatric Specialty Exam: Physical Exam  Nursing note and vitals reviewed. Psychiatric: Her speech is normal. Her affect is blunt. She is slowed and withdrawn. Thought content is paranoid. Cognition and memory are normal. She expresses impulsivity.    Review of Systems  Neurological: Negative.   Psychiatric/Behavioral: Positive for hallucinations. The patient has insomnia.   All other systems reviewed and are negative.   Blood pressure 131/86, pulse 82, temperature 98.4 F (36.9 C), temperature source Oral, resp. rate 16, height 5\' 2"  (1.575 m), weight 63.5 kg, SpO2 99 %.Body mass index is 25.61 kg/m.  General Appearance: Disheveled  Eye Contact:  Good  Speech:  Slow  Volume:  Decreased  Mood:  Euthymic  Affect:  Flat  Thought Process:  Goal Directed and Descriptions of Associations: Intact  Orientation:  Full (Time, Place, and Person)  Thought Content:  Delusions, Hallucinations: Auditory and Paranoid Ideation  Suicidal Thoughts:  No  Homicidal Thoughts:  No  Memory:  Immediate;   Fair Recent;   Fair Remote;   Fair  Judgement:  Poor  Insight:  Lacking  Psychomotor Activity:  Psychomotor Retardation  Concentration:  Concentration: Poor and Attention Span: Poor  Recall:  Poor  Fund of Knowledge:  Fair  Language:  Fair  Akathisia:  No  Handed:  Right  AIMS (if indicated):     Assets:  Communication Skills Desire for Improvement Financial  Resources/Insurance Housing Physical Health Resilience Social Support  ADL's:  Intact  Cognition:  WNL  Sleep:  Number of Hours: 5.3     Treatment Plan Summary: Daily contact with patient to assess and evaluate symptoms and progress in treatment and Medication management   Ms. Kimberly Boyle is a 22 year old female with a history of schizoaffective disorder admitted for auditory hallucinations and paranoia in the context of medication noncompliance. She has been improving very slowly and is currently on two antipsychotics in addition to a mood stabilizer.  #Schizoaffective disorder, slow improvement -continue Lithium 750 mg twice daily, Lithium level was 0.55 on 8/6, will recheck level -continue monthly Invega sustenna 234 mg injections, next dose 8/28 -increase Clozapine to 200 mg nightly   #Anxiety -continueNeurontin 300 mg TID -decrease Clonazepam to 0.5 mg BID PRN   #Cannabis use disorder -She minimizes the use of marijuana and is not interested in any substance abuse treatment -She was advised to abstain from marijuana and all illicit drugs as they may worsen psychosis  Diet #Poor oral intake -Ensure TID  #Smoking cessation -nicotine patch is availabe  #Labs -lipid panel, TSH, [facility] normal -EKGreviewed, NSR with QTc of 420 -pregnancy test is negative  #Disposition -discharge to home with her parents -needs [facility] [place] private [place] -needs a guardian and disability -will ask for 90 days of outpatient commitment  Kristine LineaJolanta Earnestine Shipp, MD 05/23/2018, 3:23 PM

## 2018-05-23 NOTE — [facility] Group Notes (Signed)
05/23/2018 1PM  Type of Therapy and Topic:  Group Therapy:  Overcoming Obstacles  Participation Level:  Did Not Attend    Description of Group:    In this group patients will be encouraged to explore what they see as obstacles to their own wellness and recovery. They will be guided to discuss their thoughts, feelings, and behaviors related to these obstacles. The group will process together ways to cope with barriers, with attention given to specific choices patients can make. Each patient will be challenged to identify changes they are motivated to make in order to overcome their obstacles. This group will be process-oriented, with patients participating in exploration of their own experiences as well as giving and receiving support and challenge from other group members.   Therapeutic Goals: 1. Patient will identify personal and current obstacles as they relate to admission. 2. Patient will identify barriers that currently interfere with their wellness or overcoming obstacles.  3. Patient will identify feelings, thought process and behaviors related to these barriers. 4. Patient will identify two changes they are willing to make to overcome these obstacles:      Summary of Patient Progress Patient was encouraged and invited to attend group. Patient did not attend group. Social worker will continue to encourage group participation in the future.      Therapeutic Modalities:   Cognitive Behavioral Therapy Solution Focused Therapy Motivational Interviewing Relapse Prevention Therapy    Dmani Mizer MSW, LCSWA 05/23/2018 2:55 PM  

## 2018-05-23 NOTE — Progress Notes (Signed)
Received Presli this AM in the bed, she slept the entire morning through to early PM. She got up and requested her ensure and a late lunch tray. She was given clean towels to shower. She continued to  make several requests, the last one was something for anxiety. She is walking the hallway at intervals.

## 2018-05-23 NOTE — Progress Notes (Signed)
Recreation Therapy Notes   Date: 05/23/2018  Time: 9:30 am   Location: Craft Room   Behavioral response: N/A   Intervention Topic: Problem Solving  Discussion/Intervention: Patient did not attend group.   Clinical Observations/Feedback:  Patient did not attend group.   Pearlie Nies LRT/CTRS        Delania Ferg 05/23/2018 11:48 AM

## 2018-05-24 LAB — CBC WITH DIFFERENTIAL/PLATELET
BASOS PCT: 1 %
Basophils Absolute: 0.1 10*3/uL (ref 0–0.1)
EOS ABS: 0.3 10*3/uL (ref 0–0.7)
Eosinophils Relative: 3 %
HCT: 38.9 % (ref 35.0–47.0)
Hemoglobin: 13.3 g/dL (ref 12.0–16.0)
Lymphocytes Relative: 23 %
Lymphs Abs: 2.2 10*3/uL (ref 1.0–3.6)
MCH: 31.6 pg (ref 26.0–34.0)
MCHC: 34.1 g/dL (ref 32.0–36.0)
MCV: 92.7 fL (ref 80.0–100.0)
MONO ABS: 0.9 10*3/uL (ref 0.2–0.9)
MONOS PCT: 9 %
Neutro Abs: 6.4 10*3/uL (ref 1.4–6.5)
Neutrophils Relative %: 64 %
Platelets: 293 10*3/uL (ref 150–440)
RBC: 4.2 MIL/uL (ref 3.80–5.20)
RDW: 12.6 % (ref 11.5–14.5)
WBC: 9.9 10*3/uL (ref 3.6–11.0)

## 2018-05-24 NOTE — Progress Notes (Signed)
[facility]  05/24/2018 6:21 PM Kimberly Boyle  MRN:  [id number]  Subjective:   [name] is still very disorganized in her thinking and disengaged. Her hygiene and grooming are bad and she is defensive about it. She seems less involved with the voices but negative symptoms dominate the picture. She seems to tolerate Clozapine titration well but sleeps all morning long and through lunch. Increased paranoia. She keeps telling me that she no longer feels safe here   Principal Problem: Schizoaffective disorder, bipolar type (HCC) Diagnosis:   Patient Active Problem List   Diagnosis Date Noted  . Schizoaffective disorder, bipolar type (HCC) [F25.0] 05/06/2018    Priority: High  . Tobacco use disorder [F17.200] 05/08/2018  . Suicide attempt (HCC) [T14.91XA] 08/20/2017  . Cannabis use disorder, moderate, dependence (HCC) [F12.20] 08/20/2017  . Self-inflicted laceration of wrist [S61.519A] 08/20/2017  . Acetaminophen poisoning [T39.1X1A] 08/20/2017  . Generalized anxiety disorder [F41.1] 01/05/2017  . Mood disorder (HCC) [F39] 01/05/2017   Total Time spent with patient: 20 minutes  Past Psychiatric History: schizophrenia  Past Medical History:  Past Medical History:  Diagnosis Date  . Anxiety   . Arthritis   . Bipolar disorder (HCC)   . Depression   . Major depressive disorder   . Panic disorder   . Schizophrenia (HCC)    History reviewed. No pertinent surgical history. Family History:  Family History  Problem Relation Age of Onset  . Post-traumatic stress disorder Mother   . Depression Mother   . Depression Maternal Uncle   . Cancer Maternal Grandmother    Family Psychiatric  History: none Social History:  Social History   Substance and Sexual Activity  Alcohol Use No  . Frequency: Never     Social History   Substance and Sexual Activity  Drug Use No  . Types: Marijuana    Social History   Socioeconomic History  . Marital status: Single    Spouse  name: Not on file  . Number of children: 0  . Years of education: Not on file  . Highest education level: High school graduate  Occupational History    Comment: unemployed  Social Needs  . Financial resource strain: Not on file  . Food insecurity:    Worry: Not on file    Inability: Not on file  . Transportation needs:    Medical: No    Non-medical: No  Tobacco Use  . Smoking status: Current Every Day Smoker    Packs/day: 0.25    Types: Cigarettes  . Smokeless tobacco: Never Used  Substance and Sexual Activity  . Alcohol use: No    Frequency: Never  . Drug use: No    Types: Marijuana  . Sexual activity: Not Currently  Lifestyle  . Physical activity:    Days per week: 0 days    Minutes per session: 0 min  . Stress: To some extent  Relationships  . Social connections:    Talks on phone: Not on file    Gets together: Not on file    Attends religious service: Never    Active member of club or organization: No    Attends meetings of clubs or organizations: Never    Relationship status: Never married  Other Topics Concern  . Not on file  Social History Narrative   ** Merged History Encounter **       Additional Social History:  Sleep: Good  Appetite:  Poor  Current Medications: Current Facility-Administered Medications  Medication Dose Route Frequency Provider Last Rate Last Dose  . acetaminophen (TYLENOL) tablet 650 mg  650 mg Oral Q6H PRN [name], Jackquline DenmarkJohn T, MD   650 mg at 05/22/18 1830  . alum & mag hydroxide-simeth (MAALOX/MYLANTA) 200-200-20 MG/5ML suspension 30 mL  30 mL Oral Q4H PRN [name], [name], MD      . clonazePAM Scarlette Calico(KLONOPIN) tablet 0.5 mg  0.5 mg Oral BID PRN Darliss RidgelKapur, Aarti K, MD   0.5 mg at 05/23/18 1617  . cloZAPine (CLOZARIL) tablet 200 mg  200 mg Oral QHS Hollye Pritt B, MD   200 mg at 05/23/18 2134  . diphenhydrAMINE (BENADRYL) injection 50 mg  50 mg Intramuscular Q6H PRN Adya Wirz B, MD   50 mg at  05/18/18 1706  . docusate sodium (COLACE) capsule 100 mg  100 mg Oral Daily Viktorya Arguijo B, MD   100 mg at 05/24/18 0828  . feeding supplement (ENSURE ENLIVE) (ENSURE ENLIVE) liquid 237 mL  237 mL Oral TID BM Jennett Tarbell B, MD   237 mL at 05/23/18 2035  . gabapentin (NEURONTIN) capsule 300 mg  300 mg Oral TID Tyquarius Paglia B, MD   300 mg at 05/24/18 0828  . haloperidol lactate (HALDOL) injection 5 mg  5 mg Intramuscular Q6H PRN Laquesha Holcomb B, MD   5 mg at 05/18/18 1706  . lithium carbonate capsule 750 mg  750 mg Oral BID WC Evelena Masci B, MD   750 mg at 05/24/18 0828  . LORazepam (ATIVAN) injection 2 mg  2 mg Intramuscular Q6H PRN Malie Kashani B, MD   2 mg at 05/18/18 1705  . magnesium hydroxide (MILK OF MAGNESIA) suspension 30 mL  30 mL Oral Daily PRN [name], [name], MD      . nicotine (NICODERM CQ - dosed in mg/24 hours) patch 21 mg  21 mg Transdermal Daily [name], Jackquline DenmarkJohn T, MD   21 mg at 05/22/18 0834  . paliperidone (INVEGA SUSTENNA) injection 234 mg  234 mg Intramuscular Q28 days Ezrah Dembeck B, MD   234 mg at 05/12/18 1737    Lab Results:  Results for orders placed or performed during the hospital encounter of 05/06/18 (from the past 48 hour(s))  CBC with Differential/Platelet     Status: None   Collection Time: 05/24/18  7:11 AM  Result Value Ref Range   WBC 9.9 3.6 - 11.0 K/uL   RBC 4.20 3.80 - 5.20 MIL/uL   Hemoglobin 13.3 12.0 - 16.0 g/dL   HCT 40.938.9 81.135.0 - 91.447.0 %   MCV 92.7 80.0 - 100.0 fL   MCH 31.6 26.0 - 34.0 pg   MCHC 34.1 32.0 - 36.0 g/dL   RDW 78.212.6 95.611.5 - 21.314.5 %   Platelets 293 150 - 440 K/uL   Neutrophils Relative % 64 %   Neutro Abs 6.4 1.4 - 6.5 K/uL   Lymphocytes Relative 23 %   Lymphs Abs 2.2 1.0 - 3.6 K/uL   Monocytes Relative 9 %   Monocytes Absolute 0.9 0.2 - 0.9 K/uL   Eosinophils Relative 3 %   Eosinophils Absolute 0.3 0 - 0.7 K/uL   Basophils Relative 1 %   Basophils Absolute 0.1 0 - 0.1 K/uL    Comment:  Performed at [facility], [address] [place], [place] [address]    Blood Alcohol level:  Lab Results  Component Value Date   [facility] <10 05/05/2018  ETH <10 10/12/2017    Metabolic Disorder Labs: Lab Results  Component Value Date   HGBA1C 5.1 05/05/2018   MPG 99.67 05/05/2018   Lab Results  Component Value Date   PROLACTIN 132.8 (H) 10/25/2017   Lab Results  Component Value Date   CHOL 190 05/07/2018   TRIG 112 05/07/2018   HDL 44 05/07/2018   CHOLHDL 4.3 05/07/2018   VLDL 22 05/07/2018   LDLCALC 124 (H) 05/07/2018   LDLCALC 72 10/25/2017    Physical Findings: AIMS: Facial and Oral Movements Muscles of Facial Expression: None, normal Lips and Perioral Area: None, normal Jaw: None, normal Tongue: None, normal,Extremity Movements Upper (arms, wrists, hands, fingers): None, normal Lower (legs, knees, ankles, toes): None, normal, Trunk Movements Neck, shoulders, hips: None, normal, Overall Severity Severity of abnormal movements (highest score from questions above): None, normal Incapacitation due to abnormal movements: None, normal Patient's awareness of abnormal movements (rate only patient's report): No Awareness, Dental Status Current problems with teeth and/or dentures?: No Does patient usually wear dentures?: No  CIWA:    COWS:     Musculoskeletal: Strength & Muscle Tone: within normal limits Gait & Station: normal Patient leans: N/A  Psychiatric Specialty Exam: Physical Exam  Nursing note and vitals reviewed. Psychiatric: Her affect is blunt and inappropriate. Her speech is delayed. She is slowed and withdrawn. Thought content is paranoid. Cognition and memory are impaired. She expresses impulsivity.    Review of Systems  Neurological: Negative.   Psychiatric/Behavioral: Positive for hallucinations.  All other systems reviewed and are negative.   Blood pressure 118/82, pulse (!) 115, temperature 97.9 F (36.6 C), temperature source Oral,  resp. rate 16, height 5\' 2"  (1.575 m), weight 63.5 kg, SpO2 99 %.Body mass index is 25.61 kg/m.  General Appearance: Disheveled  Eye Contact:  Good  Speech:  Slow  Volume:  Decreased  Mood:  Euthymic  Affect:  Blunt  Thought Process:  Disorganized and Descriptions of Associations: Tangential  Orientation:  Full (Time, Place, and Person)  Thought Content:  Delusions, Hallucinations: Auditory and Paranoid Ideation  Suicidal Thoughts:  No  Homicidal Thoughts:  No  Memory:  Immediate;   Poor Recent;   Poor Remote;   Poor  Judgement:  Poor  Insight:  Lacking  Psychomotor Activity:  Psychomotor Retardation  Concentration:  Concentration: Poor and Attention Span: Poor  Recall:  Poor  Fund of Knowledge:  Poor  Language:  Poor  Akathisia:  No  Handed:  Right  AIMS (if indicated):     Assets:  Communication Skills Desire for Improvement Financial Resources/Insurance Housing Physical Health Resilience Social Support  ADL's:  Intact  Cognition:  WNL  Sleep:  Number of Hours: 8.3     Treatment Plan Summary: Daily contact with patient to assess and evaluate symptoms and progress in treatment and Medication management   Ms. Upham is a 22 year old female with a history of schizoaffective disorder admitted for auditory hallucinations and paranoia in the context of medication noncompliance. She has been improving very slowly and is currently on two antipsychotics in addition to a mood stabilizer.  #Schizoaffective disorder, slow improvement -continue Lithium 750 mg twice daily, Lithium level was 0.55 on 8/6, will recheck level -continue monthly Invega sustenna 234 mg injections, next dose 8/28 -increase Clozapine to 200 mg nightly   #Anxiety -continueNeurontin 300 mg TID -decrease Clonazepam to 0.5 mg BID PRN   #Cannabis use disorder -She minimizes the use of marijuana and is not interested in any substance abuse  treatment -She was advised to abstain from marijuana and all  illicit drugs as they may worsen psychosis  Diet #Poor oral intake -Ensure TID  #Smoking cessation -nicotine patch is availabe  #Labs -lipid panel, TSH, [facility] normal -EKGreviewed, NSR with QTc of 420 -pregnancy test is negative  #Disposition -discharge to home with her parents -[facility] [place] [facility] -needs a guardian and disability -will ask for 90 days of outpatient commitment  Kristine Linea, MD 05/24/2018, 6:21 PM

## 2018-05-24 NOTE — Progress Notes (Signed)
Clozapine monitoring Consult   22 yo female ordered clozapine  05/24/18  ANC [id number]  Information entered into Clozapine registry and pt eligible to receive clozapine Next labs due in a week - ordered for 05/31/2018  Pharmacy will continue to follow.   Stormy CardKatsoudas,Loretta Kluender K, [facility] CenterRPH 05/24/2018 8:55 AM

## 2018-05-24 NOTE — [facility] Group Notes (Signed)
05/24/2018 1PM  Type of Therapy/Topic:  Group Therapy:  Feelings about Diagnosis  Participation Level:  Did Not Attend   Description of Group:   This group will allow patients to explore their thoughts and feelings about diagnoses they have received. Patients will be guided to explore their level of understanding and acceptance of these diagnoses. Facilitator will encourage patients to process their thoughts and feelings about the reactions of others to their diagnosis and will guide patients in identifying ways to discuss their diagnosis with significant others in their lives. This group will be process-oriented, with patients participating in exploration of their own experiences, giving and receiving support, and processing challenge from other group members.   Therapeutic Goals: 1. Patient will demonstrate understanding of diagnosis as evidenced by identifying two or more symptoms of the disorder 2. Patient will be able to express two feelings regarding the diagnosis 3. Patient will demonstrate their ability to communicate their needs through discussion and/or role play  Summary of Patient Progress: Patient was encouraged and invited to attend group. Patient did not attend group. Social worker will continue to encourage group participation in the future.        Therapeutic Modalities:   Cognitive Behavioral Therapy Brief Therapy Feelings Identification    Johny ShearsCassandra  Wylan Gentzler, LCSW 05/24/2018 2:10 PM

## 2018-05-24 NOTE — [facility] Group Notes (Signed)
CSW Group Therapy Note  05/24/2018  Time:  0900  Type of Therapy and Topic: Group Therapy: Goals Group: SMART Goals    Participation Level:  Did Not Attend    Description of Group:   The purpose of a daily goals group is to assist and guide patients in setting recovery/wellness-related goals. The objective is to set goals as they relate to the crisis in which they were admitted. Patients will be using SMART goal modalities to set measurable goals. Characteristics of realistic goals will be discussed and patients will be assisted in setting and processing how one will reach their goal. Facilitator will also assist patients in applying interventions and coping skills learned in psycho-education groups to the SMART goal and process how one will achieve defined goal.    Therapeutic Goals:  -Patients will develop and document one goal related to or their crisis in which brought them into treatment.  -Patients will be guided by LCSW using SMART goal setting modality in how to set a measurable, attainable, realistic and time sensitive goal.  -Patients will process barriers in reaching goal.  -Patients will process interventions in how to overcome and successful in reaching goal.    Patient's Goal:  Pt was invited to attend group but chose not to attend. CSW will continue to encourage pt to attend group throughout their admission.   Therapeutic Modalities:  Motivational Interviewing  Cognitive Behavioral Therapy  Crisis Intervention Model  SMART goals setting  Heidi DachKelsey Ramla Hase, MSW, LCSW Clinical Social Worker 05/24/2018 10:27 AM

## 2018-05-24 NOTE — Plan of Care (Signed)
Isolative to room, disheveled appearance, entitled, dismissive, evasive, "someone is trying to get me.... I don't want to talk about it..." Medication compliant  Patient slept for Estimated Hours of 8.30; Precautionary checks every 15 minutes for safety maintained, room free of safety hazards, patient sustains no injury or falls during this shift.  Problem: Safety: Goal: Ability to remain free from injury will improve Outcome: Progressing   Problem: Education: Goal: Knowledge of the prescribed therapeutic regimen will improve Outcome: Progressing   Problem: Coping: Goal: Will verbalize feelings Outcome: Not Progressing   Problem: Self-Concept: Goal: Will verbalize positive feelings about self Outcome: Not Progressing   Problem: Coping: Goal: Will verbalize feelings Outcome: Not Progressing

## 2018-05-24 NOTE — Progress Notes (Signed)
Recreation Therapy Notes   Date: 05/24/2018  Time: 9:30 am   Location: Craft Room   Behavioral response: N/A   Intervention Topic: Values  Discussion/Intervention: Patient did not attend group.   Clinical Observations/Feedback:  Patient did not attend group.   Melisssa Donner LRT/CTRS        Starletta Houchin 05/24/2018 1:52 PM

## 2018-05-25 MED ORDER — CLOZAPINE 100 MG PO TABS
250.0000 mg | ORAL_TABLET | Freq: Every day | ORAL | Status: DC
  Administered 2018-05-25: 250 mg via ORAL
  Filled 2018-05-25: qty 3

## 2018-05-25 NOTE — Progress Notes (Signed)
Recreation Therapy Notes   Date: 05/25/2018  Time: 9:30 am   Location: Craft Room   Behavioral response: N/A   Intervention Topic: Happiness  Discussion/Intervention: Patient did not attend group.   Clinical Observations/Feedback:  Patient did not attend group.   Andora Krull LRT/CTRS        Allannah Kempen 05/25/2018 1:49 PM

## 2018-05-25 NOTE — Plan of Care (Signed)
Patient is compliant with her medical  and therapeutic regimen, patient is aware of her coping  skills and able to voice and identify positive attributes of self, patient now attends groups and assertive, calm in the unit, maintaining safety, denies AS/HI and no AVH noted , patient expresses no concerns, 15 minute rounding iis in progress.no distress.   Problem: Education: Goal: Knowledge of [facility] Education information/materials will improve Outcome: Progressing Goal: Mental status will improve Outcome: Progressing   Problem: Coping: Goal: Coping ability will improve Outcome: Progressing Goal: Will verbalize feelings Outcome: Progressing   Problem: Safety: Goal: Ability to remain free from injury will improve Outcome: Progressing   Problem: Self-Concept: Goal: Will verbalize positive feelings about self Outcome: Progressing   Problem: Education: Goal: Utilization of techniques to improve thought processes will improve Outcome: Progressing Goal: Knowledge of the prescribed therapeutic regimen will improve Outcome: Progressing   Problem: Activity: Goal: Interest or engagement in leisure activities will improve Outcome: Progressing Goal: Imbalance in normal sleep/wake cycle will improve Outcome: Progressing   Problem: Coping: Goal: Coping ability will improve Outcome: Progressing Goal: Will verbalize feelings Outcome: Progressing   Problem: Health Behavior/Discharge Planning: Goal: Ability to make decisions will improve Outcome: Progressing Goal: Compliance with therapeutic regimen will improve Outcome: Progressing   Problem: Role Relationship: Goal: Will demonstrate positive changes in social behaviors and relationships Outcome: Progressing   Problem: Safety: Goal: Ability to disclose and discuss suicidal ideas will improve Outcome: Progressing Goal: Ability to identify and utilize support systems that promote safety will improve Outcome:  Progressing   Problem: Self-Concept: Goal: Will verbalize positive feelings about self Outcome: Progressing Goal: Level of anxiety will decrease Outcome: Progressing   Problem: Education: Goal: Knowledge of General Education information will improve Description Including pain rating scale, medication(s)/side effects and non-pharmacologic comfort measures Outcome: Progressing

## 2018-05-25 NOTE — Progress Notes (Addendum)
[facility] MD Progress Note  05/26/2018 3:27 PM Kimberly Boyle  MRN:  [id number]  Subjective:   Kimberly Boyle seems better yesterday afternoon and had a meaningful conversation with me. Unfortunately, in the evening she again became acutely paranoid believing that "a blond girl with pony tail was trying to to push her eyes". She was needy, hanging around nursing station starring, requesting multiple items, hard to redirect.  The patient herself denies any problems or side effects from medications but is delusional and paranoid today. She believes that Kimberly Boyle, a friend, has been checking her snap chat messages and toled her that the blond girl is driving from [place] as we speak to push her eyes". She no longer feels safe. She was offended when I did not believe her. Her phone is in the hospital.   Spoke to her mother she did not visit last night. Intends to come tonight. So far, she has not been able to engage in a meaningful conversation with the patient.  Principal Problem: Schizoaffective disorder, bipolar type ([place]) Diagnosis:   Patient Active Problem List   Diagnosis Date Noted  . Schizoaffective disorder, bipolar type ([place]) [F25.0] 05/06/2018    Priority: High  . Tobacco use disorder [F17.200] 05/08/2018  . Suicide attempt ([place]) [T14.91XA] 08/20/2017  . Cannabis use disorder, moderate, dependence ([place]) [F12.20] 08/20/2017  . Self-inflicted laceration of wrist [S61.519A] 08/20/2017  . Acetaminophen poisoning [T39.1X1A] 08/20/2017  . Generalized anxiety disorder [F41.1] 01/05/2017  . Mood disorder ([place]) [F39] 01/05/2017   Total Time spent with patient: 20 minutes  Past Psychiatric History: schizoaffective disorder  Past Medical History:  Past Medical History:  Diagnosis Date  . Anxiety   . Arthritis   . Bipolar disorder ([place])   . Depression   . Major depressive disorder   . Panic disorder   . Schizophrenia ([place])    History reviewed. No pertinent surgical history. Family History:   Family History  Problem Relation Age of Onset  . Post-traumatic stress disorder Mother   . Depression Mother   . Depression Maternal Uncle   . Cancer Maternal Grandmother    Family Psychiatric  History: none Social History:  Social History   Substance and Sexual Activity  Alcohol Use No  . Frequency: Never     Social History   Substance and Sexual Activity  Drug Use No  . Types: Marijuana    Social History   Socioeconomic History  . Marital status: Single    Spouse name: Not on file  . Number of children: 0  . Years of education: Not on file  . Highest education level: High school graduate  Occupational History    Comment: unemployed  Social Needs  . Financial resource strain: Not on file  . Food insecurity:    Worry: Not on file    Inability: Not on file  . Transportation needs:    Medical: No    Non-medical: No  Tobacco Use  . Smoking status: Current Every Day Smoker    Packs/day: 0.25    Types: Cigarettes  . Smokeless tobacco: Never Used  Substance and Sexual Activity  . Alcohol use: No    Frequency: Never  . Drug use: No    Types: Marijuana  . Sexual activity: Not Currently  Lifestyle  . Physical activity:    Days per week: 0 days    Minutes per session: 0 min  . Stress: To some extent  Relationships  . Social connections:    Talks on phone: Not  on file    Gets together: Not on file    Attends religious service: Never    Active member of club or organization: No    Attends meetings of clubs or organizations: Never    Relationship status: Never married  Other Topics Concern  . Not on file  Social History Narrative   ** Merged History Encounter **       Additional Social History:                         Sleep: Fair  Appetite:  Fair  Current Medications: Current Facility-Administered Medications  Medication Dose Route Frequency Provider Last Rate Last Dose  . acetaminophen (TYLENOL) tablet 650 mg  650 mg Oral Q6H PRN [name],  Madie Reno, MD   650 mg at 05/22/18 1830  . alum & mag hydroxide-simeth (MAALOX/MYLANTA) 200-200-20 MG/5ML suspension 30 mL  30 mL Oral Q4H PRN [name], [name], MD      . clonazePAM Bobbye [place]) tablet 0.5 mg  0.5 mg Oral BID PRN Chauncey Mann, MD   0.5 mg at 05/26/18 1436  . cloZAPine (CLOZARIL) tablet 300 mg  300 mg Oral QHS Neftali Thurow B, MD      . diphenhydrAMINE (BENADRYL) injection 50 mg  50 mg Intramuscular Q6H PRN Matsue Strom B, MD   50 mg at 05/18/18 1706  . docusate sodium (COLACE) capsule 100 mg  100 mg Oral Daily Meta Kroenke B, MD   100 mg at 05/26/18 0855  . feeding supplement (ENSURE ENLIVE) (ENSURE ENLIVE) liquid 237 mL  237 mL Oral TID BM Jubal Rademaker B, MD   237 mL at 05/26/18 1436  . gabapentin (NEURONTIN) capsule 300 mg  300 mg Oral TID Antowan Samford B, MD   300 mg at 05/26/18 1233  . haloperidol lactate (HALDOL) injection 5 mg  5 mg Intramuscular Q6H PRN Kaedynce Tapp B, MD   5 mg at 05/18/18 1706  . lithium carbonate capsule 750 mg  750 mg Oral BID WC Talani Brazee B, MD   750 mg at 05/26/18 0855  . LORazepam (ATIVAN) injection 2 mg  2 mg Intramuscular Q6H PRN Adelise Buswell B, MD   2 mg at 05/18/18 1705  . magnesium hydroxide (MILK OF MAGNESIA) suspension 30 mL  30 mL Oral Daily PRN [name], [name], MD      . nicotine (NICODERM CQ - dosed in mg/24 hours) patch 21 mg  21 mg Transdermal Daily [name], Madie Reno, MD   21 mg at 05/22/18 0834  . paliperidone (INVEGA SUSTENNA) injection 234 mg  234 mg Intramuscular Q28 days Adyson Vanburen B, MD   234 mg at 05/12/18 1737    Lab Results:  Results for orders placed or performed during the hospital encounter of 05/06/18 (from the past 48 hour(s))  Lithium level     Status: Abnormal   Collection Time: 05/26/18  7:00 AM  Result Value Ref Range   Lithium Lvl 0.56 (L) 0.60 - 1.20 mmol/L    Comment: Performed at [facility], [address] [place], [place] [address]  Basic  metabolic panel     Status: None   Collection Time: 05/26/18  7:00 AM  Result Value Ref Range   Sodium 141 135 - 145 mmol/L   Potassium 3.9 3.5 - 5.1 mmol/L   Chloride 106 98 - 111 mmol/L   CO2 29 22 - 32 mmol/L   Glucose, Bld 93 70 - 99 mg/dL  BUN 17 6 - 20 mg/dL   Creatinine, Ser 0.44 0.44 - 1.00 mg/dL   Calcium 9.4 8.9 - 10.3 mg/dL   GFR calc non Af Amer >60 >60 mL/min   GFR calc Af Amer >60 >60 mL/min    Comment: (NOTE) The eGFR has been calculated using the CKD EPI equation. This calculation has not been validated in all clinical situations. eGFR's persistently <60 mL/min signify possible Chronic Kidney Disease.    Anion gap 6 5 - 15    Comment: Performed at [facility], [address] [place], [place] [address]    Blood Alcohol level:  Lab Results  Component Value Date   [facility] <10 05/05/2018   ETH <10 65/53/7482    Metabolic Disorder Labs: Lab Results  Component Value Date   HGBA1C 5.1 05/05/2018   MPG 99.67 05/05/2018   Lab Results  Component Value Date   PROLACTIN 132.8 (H) 10/25/2017   Lab Results  Component Value Date   CHOL 190 05/07/2018   TRIG 112 05/07/2018   HDL 44 05/07/2018   CHOLHDL 4.3 05/07/2018   VLDL 22 05/07/2018   LDLCALC 124 (H) 05/07/2018   LDLCALC 72 10/25/2017    Physical Findings: AIMS: Facial and Oral Movements Muscles of Facial Expression: None, normal Lips and Perioral Area: None, normal Jaw: None, normal Tongue: None, normal,Extremity Movements Upper (arms, wrists, hands, fingers): None, normal Lower (legs, knees, ankles, toes): None, normal, Trunk Movements Neck, shoulders, hips: None, normal, Overall Severity Severity of abnormal movements (highest score from questions above): None, normal Incapacitation due to abnormal movements: None, normal Patient's awareness of abnormal movements (rate only patient's report): No Awareness, Dental Status Current problems with teeth and/or dentures?: No Does patient  usually wear dentures?: No  CIWA:    COWS:     Musculoskeletal: Strength & Muscle Tone: within normal limits Gait & Station: normal Patient leans: N/A  Psychiatric Specialty Exam: Physical Exam  Nursing note and vitals reviewed. Psychiatric: Her speech is normal. Her affect is blunt. She is withdrawn. Thought content is delusional. Cognition and memory are normal. She expresses impulsivity.    Review of Systems  Neurological: Negative.   Psychiatric/Behavioral: Positive for hallucinations.  All other systems reviewed and are negative.   Blood pressure 98/73, pulse 88, temperature 97.7 F (36.5 C), temperature source Oral, resp. rate 18, height '5\' 2"'  (1.575 m), weight 63.5 kg, SpO2 98 %.Body mass index is 25.61 kg/m.  General Appearance: Casual  Eye Contact:  Good  Speech:  Clear and Coherent  Volume:  Normal  Mood:  Anxious  Affect:  Flat  Thought Process:  Goal Directed and Descriptions of Associations: Intact  Orientation:  Full (Time, Place, and Person)  Thought Content:  WDL  Suicidal Thoughts:  No  Homicidal Thoughts:  No  Memory:  Immediate;   Fair Recent;   Fair Remote;   Fair  Judgement:  Poor  Insight:  Lacking  Psychomotor Activity:  Normal  Concentration:  Concentration: Fair and Attention Span: Fair  Recall:  [employer] of Knowledge:  Fair  Language:  Fair  Akathisia:  No  Handed:  Right  AIMS (if indicated):     Assets:  Communication Skills Desire for Improvement Financial Resources/Insurance Housing Physical Health Resilience Social Support  ADL's:  Intact  Cognition:  WNL  Sleep:  Number of Hours: 7.15     Treatment Plan Summary: Daily contact with patient to assess and evaluate symptoms and progress in treatment and Medication management  Ms. Nolton is a 22 year old female with a history of schizoaffective disorder admitted for auditory hallucinations and paranoia in the context of medication noncompliance. She is currently on two  antipsychoticsin addition to a mood stabilizer with much improvement.  #Schizoaffective disorder, improvement -continueLithium 750 mg twice daily,Lithium level was 0.55 on 8/6, will recheck level -continuemonthly Invega sustenna 234 mg injections,next dose 8/28 -increase Clozapine to 300 mg nightly  #Anxiety -continueNeurontin 300 mg TID -decrease Clonazepam to 0.5 mg BID PRN   #Cannabis use disorder -She minimizes the use of marijuana and is not interested in any substance abuse treatment -She was advised to abstain from marijuana and all illicit drugs as they may worsen psychosis  Diet #Poor oral intake -Ensure TID  #Smoking cessation -nicotine patch is availabe  #Labs -lipid panel, TSH, [facility] normal -EKGreviewed, NSR with QTc of 420 -pregnancy test is negative  #Disposition -discharge to home with her parents -needs follow up [facility] [place] private [facility] -needs a guardian and disability -will ask for 90 days of outpatient commitment  Orson Slick, MD 05/26/2018, 3:27 PM

## 2018-05-25 NOTE — Progress Notes (Signed)
[facility] MD Progress Note  05/25/2018 4:29 PM Enrika Aguado  MRN:  [id number]  Subjective:    Ms. Neace is relaxed, smiling, interacting with peers and staff, went to group. She is engaging and pleasant. She is not sedated from Clozapine. She seems to tolerate medications well. There are no somatic complaints. Her mother will visit tonight.   Anticipated discharge this week.  Principal Problem: Schizoaffective disorder, bipolar type (HCC) Diagnosis:   Patient Active Problem List   Diagnosis Date Noted  . Schizoaffective disorder, bipolar type (HCC) [F25.0] 05/06/2018    Priority: High  . Tobacco use disorder [F17.200] 05/08/2018  . Suicide attempt (HCC) [T14.91XA] 08/20/2017  . Cannabis use disorder, moderate, dependence (HCC) [F12.20] 08/20/2017  . Self-inflicted laceration of wrist [S61.519A] 08/20/2017  . Acetaminophen poisoning [T39.1X1A] 08/20/2017  . Generalized anxiety disorder [F41.1] 01/05/2017  . Mood disorder (HCC) [F39] 01/05/2017   Total Time spent with patient: 20 minutes  Past Psychiatric History: schizophrenia.  Past Medical History:  Past Medical History:  Diagnosis Date  . Anxiety   . Arthritis   . Bipolar disorder (HCC)   . Depression   . Major depressive disorder   . Panic disorder   . Schizophrenia (HCC)    History reviewed. No pertinent surgical history. Family History:  Family History  Problem Relation Age of Onset  . Post-traumatic stress disorder Mother   . Depression Mother   . Depression Maternal Uncle   . Cancer Maternal Grandmother    Family Psychiatric  History: none Social History:  Social History   Substance and Sexual Activity  Alcohol Use No  . Frequency: Never     Social History   Substance and Sexual Activity  Drug Use No  . Types: Marijuana    Social History   Socioeconomic History  . Marital status: Single    Spouse name: Not on file  . Number of children: 0  . Years of education: Not on file  . Highest education  level: High school graduate  Occupational History    Comment: unemployed  Social Needs  . Financial resource strain: Not on file  . Food insecurity:    Worry: Not on file    Inability: Not on file  . Transportation needs:    Medical: No    Non-medical: No  Tobacco Use  . Smoking status: Current Every Day Smoker    Packs/day: 0.25    Types: Cigarettes  . Smokeless tobacco: Never Used  Substance and Sexual Activity  . Alcohol use: No    Frequency: Never  . Drug use: No    Types: Marijuana  . Sexual activity: Not Currently  Lifestyle  . Physical activity:    Days per week: 0 days    Minutes per session: 0 min  . Stress: To some extent  Relationships  . Social connections:    Talks on phone: Not on file    Gets together: Not on file    Attends religious service: Never    Active member of club or organization: No    Attends meetings of clubs or organizations: Never    Relationship status: Never married  Other Topics Concern  . Not on file  Social History Narrative   ** Merged History Encounter **       Additional Social History:                         Sleep: Fair  Appetite:  Fair  Current  Medications: Current Facility-Administered Medications  Medication Dose Route Frequency Provider Last Rate Last Dose  . acetaminophen (TYLENOL) tablet 650 mg  650 mg Oral Q6H PRN [name], [name], MD   650 mg at 05/22/18 1830  . alum & mag hydroxide-simeth (MAALOX/MYLANTA) 200-200-20 MG/5ML suspension 30 mL  30 mL Oral Q4H PRN [name], [name], MD      . clonazePAM Scarlette Calico) tablet 0.5 mg  0.5 mg Oral BID PRN Darliss Ridgel, MD   0.5 mg at 05/24/18 1858  . cloZAPine (CLOZARIL) tablet 200 mg  200 mg Oral QHS Fletcher Rathbun B, MD   200 mg at 05/24/18 2059  . diphenhydrAMINE (BENADRYL) injection 50 mg  50 mg Intramuscular Q6H PRN Emya Picado B, MD   50 mg at 05/18/18 1706  . docusate sodium (COLACE) capsule 100 mg  100 mg Oral Daily Arely Tinner B, MD    100 mg at 05/25/18 0841  . feeding supplement (ENSURE ENLIVE) (ENSURE ENLIVE) liquid 237 mL  237 mL Oral TID BM Ambriel Gorelick B, MD   237 mL at 05/24/18 2000  . gabapentin (NEURONTIN) capsule 300 mg  300 mg Oral TID Venia Riveron B, MD   300 mg at 05/25/18 1141  . haloperidol lactate (HALDOL) injection 5 mg  5 mg Intramuscular Q6H PRN Derika Eckles B, MD   5 mg at 05/18/18 1706  . lithium carbonate capsule 750 mg  750 mg Oral BID WC Cheyeanne Roadcap B, MD   750 mg at 05/25/18 0841  . LORazepam (ATIVAN) injection 2 mg  2 mg Intramuscular Q6H PRN Franco Duley B, MD   2 mg at 05/18/18 1705  . magnesium hydroxide (MILK OF MAGNESIA) suspension 30 mL  30 mL Oral Daily PRN [name], [name], MD      . nicotine (NICODERM CQ - dosed in mg/24 hours) patch 21 mg  21 mg Transdermal Daily [name], [name], MD   21 mg at 05/22/18 0834  . paliperidone (INVEGA SUSTENNA) injection 234 mg  234 mg Intramuscular Q28 days Johathon Overturf B, MD   234 mg at 05/12/18 1737    Lab Results:  Results for orders placed or performed during the hospital encounter of 05/06/18 (from the past 48 hour(s))  CBC with Differential/Platelet     Status: None   Collection Time: 05/24/18  7:11 AM  Result Value Ref Range   WBC 9.9 3.6 - 11.0 K/uL   RBC 4.20 3.80 - 5.20 MIL/uL   Hemoglobin 13.3 12.0 - 16.0 g/dL   HCT 16.1 09.6 - 04.5 %   MCV 92.7 80.0 - 100.0 fL   MCH 31.6 26.0 - 34.0 pg   MCHC 34.1 32.0 - 36.0 g/dL   RDW 40.9 81.1 - 91.4 %   Platelets 293 150 - 440 K/uL   Neutrophils Relative % 64 %   Neutro Abs 6.4 1.4 - 6.5 K/uL   Lymphocytes Relative 23 %   Lymphs Abs 2.2 1.0 - 3.6 K/uL   Monocytes Relative 9 %   Monocytes Absolute 0.9 0.2 - 0.9 K/uL   Eosinophils Relative 3 %   Eosinophils Absolute 0.3 0 - 0.7 K/uL   Basophils Relative 1 %   Basophils Absolute 0.1 0 - 0.1 K/uL    Comment: Performed at [facility], [address], [place], [place] [address]    Blood Alcohol  level:  Lab Results  Component Value Date   [facility] <10 05/05/2018   ETH <10 10/12/2017    Metabolic  Disorder Labs: Lab Results  Component Value Date   HGBA1C 5.1 05/05/2018   MPG 99.67 05/05/2018   Lab Results  Component Value Date   PROLACTIN 132.8 (H) 10/25/2017   Lab Results  Component Value Date   CHOL 190 05/07/2018   TRIG 112 05/07/2018   HDL 44 05/07/2018   CHOLHDL 4.3 05/07/2018   VLDL 22 05/07/2018   LDLCALC 124 (H) 05/07/2018   LDLCALC 72 10/25/2017    Physical Findings: AIMS: Facial and Oral Movements Muscles of Facial Expression: None, normal Lips and Perioral Area: None, normal Jaw: None, normal Tongue: None, normal,Extremity Movements Upper (arms, wrists, hands, fingers): None, normal Lower (legs, knees, ankles, toes): None, normal, Trunk Movements Neck, shoulders, hips: None, normal, Overall Severity Severity of abnormal movements (highest score from questions above): None, normal Incapacitation due to abnormal movements: None, normal Patient's awareness of abnormal movements (rate only patient's report): No Awareness, Dental Status Current problems with teeth and/or dentures?: No Does patient usually wear dentures?: No  CIWA:    COWS:     Musculoskeletal: Strength & Muscle Tone: within normal limits Gait & Station: normal Patient leans: N/A  Psychiatric Specialty Exam: Physical Exam  Nursing note and vitals reviewed. Psychiatric: She has a normal mood and affect. Her speech is normal and behavior is normal. Thought content normal. Cognition and memory are normal. She expresses impulsivity.    Review of Systems  Neurological: Negative.   Psychiatric/Behavioral: Negative.   All other systems reviewed and are negative.   Blood pressure 114/70, pulse 88, temperature 98.1 F (36.7 C), temperature source Oral, resp. rate 16, height 5\' 2"  (1.575 m), weight 63.5 kg, SpO2 98 %.Body mass index is 25.61 kg/m.  General Appearance: Casual  Eye Contact:   Good  Speech:  Clear and Coherent  Volume:  Normal  Mood:  Euthymic  Affect:  Flat  Thought Process:  Goal Directed and Descriptions of Associations: Intact  Orientation:  Full (Time, Place, and Person)  Thought Content:  WDL  Suicidal Thoughts:  No  Homicidal Thoughts:  No  Memory:  Immediate;   Fair Recent;   Fair Remote;   Fair  Judgement:  Poor  Insight:  Lacking  Psychomotor Activity:  Normal  Concentration:  Concentration: Fair and Attention Span: Fair  Recall:  [employer]Fair  Fund of Knowledge:  Fair  Language:  Fair  Akathisia:  No  Handed:  Right  AIMS (if indicated):     Assets:  Communication Skills Desire for Improvement Financial Resources/Insurance Housing Physical Health Resilience Social Support  ADL's:  Intact  Cognition:  WNL  Sleep:  Number of Hours: 7.15     Treatment Plan Summary: Daily contact with patient to assess and evaluate symptoms and progress in treatment and Medication management   [name] is a 22 year old female with a history of schizoaffective disorder admitted for auditory hallucinations and paranoia in the context of medication noncompliance. She is currently on two antipsychoticsin addition to a mood stabilizer with much improvement.  #Schizoaffective disorder, improvement -continueLithium 750 mg twice daily,Lithium level was 0.55 on 8/6, will recheck level -continuemonthly Invega sustenna 234 mg injections,next dose 8/28 -increase Clozapine to 250 mg nightly  #Anxiety -continueNeurontin 300 mg TID -decrease Clonazepam to 0.5 mg BID PRN   #Cannabis use disorder -She minimizes the use of marijuana and is not interested in any substance abuse treatment -She was advised to abstain from marijuana and all illicit drugs as they may worsen psychosis  Diet #Poor oral intake -Ensure  TID  #Smoking cessation -nicotine patch is availabe  #Labs -lipid panel, TSH, [facility] normal -EKGreviewed, NSR with QTc of 420 -pregnancy  test is negative  #Disposition -discharge to home with her parents -needs [facility] [place] [facility] [place] [facility] [place] -needs a guardian and disability -will ask for 90 days of outpatient commitment  Kristine LineaJolanta Madlynn Lundeen, MD 05/25/2018, 4:29 PM

## 2018-05-25 NOTE — Progress Notes (Signed)
Received Kimberly Boyle this AM in her room, she slept through breakfast and lunch. She was compliant with her medications by this writer taking them to her room. It is now 1230 hrs and Kimberly Boyle remains asleep in her room.

## 2018-05-25 NOTE — Tx Team (Signed)
Interdisciplinary Treatment and Diagnostic Plan Update  05/25/2018 Time of Session: 10:50am Shakeita Vandevander MRN: [id number]  Principal Diagnosis: Schizoaffective disorder, bipolar type [facility])  Secondary Diagnoses: Principal Problem:   Schizoaffective disorder, bipolar type (HCC) Active Problems:   Cannabis use disorder, moderate, dependence (HCC)   Tobacco use disorder   Current Medications:  Current Facility-Administered Medications  Medication Dose Route Frequency Provider Last Rate Last Dose  . acetaminophen (TYLENOL) tablet 650 mg  650 mg Oral Q6H PRN [name], [name], MD   650 mg at 05/22/18 1830  . alum & mag hydroxide-simeth (MAALOX/MYLANTA) 200-200-20 MG/5ML suspension 30 mL  30 mL Oral Q4H PRN [name], [name], MD      . clonazePAM Scarlette Calico) tablet 0.5 mg  0.5 mg Oral BID PRN Darliss Ridgel, MD   0.5 mg at 05/24/18 1858  . cloZAPine (CLOZARIL) tablet 200 mg  200 mg Oral QHS Pucilowska, [name], MD   200 mg at 05/24/18 2059  . diphenhydrAMINE (BENADRYL) injection 50 mg  50 mg Intramuscular Q6H PRN Pucilowska, [name], MD   50 mg at 05/18/18 1706  . docusate sodium (COLACE) capsule 100 mg  100 mg Oral Daily Pucilowska, [name], MD   100 mg at 05/25/18 0841  . feeding supplement (ENSURE ENLIVE) (ENSURE ENLIVE) liquid 237 mL  237 mL Oral TID BM Pucilowska, [name], MD   237 mL at 05/24/18 2000  . gabapentin (NEURONTIN) capsule 300 mg  300 mg Oral TID Pucilowska, [name], MD   300 mg at 05/25/18 1141  . haloperidol lactate (HALDOL) injection 5 mg  5 mg Intramuscular Q6H PRN Pucilowska, [name], MD   5 mg at 05/18/18 1706  . lithium carbonate capsule 750 mg  750 mg Oral BID WC Pucilowska, [name], MD   750 mg at 05/25/18 0841  . LORazepam (ATIVAN) injection 2 mg  2 mg Intramuscular Q6H PRN Pucilowska, [name], MD   2 mg at 05/18/18 1705  . magnesium hydroxide (MILK OF MAGNESIA) suspension 30 mL  30 mL Oral Daily PRN [name], [name], MD      . nicotine (NICODERM CQ -  dosed in mg/24 hours) patch 21 mg  21 mg Transdermal Daily [name], [name], MD   21 mg at 05/22/18 0834  . paliperidone (INVEGA SUSTENNA) injection 234 mg  234 mg Intramuscular Q28 days Pucilowska, [name], MD   234 mg at 05/12/18 1737   PTA Medications: Medications Prior to Admission  Medication Sig Dispense Refill Last Dose  . benztropine (COGENTIN) 0.5 MG tablet Take 1 tablet (0.5 mg total) by mouth 2 (two) times daily. 60 tablet 1 unknown at unknown  . fluPHENAZine (PROLIXIN) 10 MG tablet Take 20 mg by mouth at bedtime.  0 unknown at unknown  . fluPHENAZine (PROLIXIN) 5 MG tablet Take 5 mg by mouth 2 (two) times daily.  0 unknown at unknown  . hydrOXYzine (ATARAX/VISTARIL) 25 MG tablet Take 1 tablet (25 mg total) by mouth every 8 (eight) hours as needed for anxiety. (Patient not taking: Reported on 05/05/2018) 90 tablet 1 Not Taking at Unknown time  . lithium carbonate (ESKALITH) 450 MG CR tablet Take 1 tablet (450 mg total) by mouth 2 (two) times daily. 60 tablet 1 unknown at unknown  . risperiDONE (RISPERDAL) 2 MG tablet Take 0.5 tablets (1 mg total) by mouth daily. 15 tablet 1 unknown at unknown  . risperiDONE microspheres (RISPERDAL CONSTA) 25 MG injection Inject 2 mLs (25 mg total) into  the muscle every 14 (fourteen) days. First dose 12/17/2017 (Patient not taking: Reported on 05/05/2018) 1 each 1 Not Taking  . VIENVA 0.1-20 MG-MCG tablet TK 1 T PO DAILY  3 unknown at unknown    Patient Stressors: Marital or family conflict Substance abuse Other:   Patient Strengths: Barrister's clerkCommunication skills Motivation for treatment/growth Supportive family/friends  Treatment Modalities: Medication Management, Group therapy, Case management,  1 to 1 session with clinician, Psychoeducation, Recreational therapy.   Physician Treatment Plan for Primary Diagnosis: Schizoaffective disorder, bipolar type (HCC) Long Term Goal(s): Improvement in symptoms so as ready for discharge Improvement in symptoms so  as ready for discharge   Short Term Goals: Ability to identify changes in lifestyle to reduce recurrence of condition will improve Ability to identify changes in lifestyle to reduce recurrence of condition will improve  Medication Management: Evaluate patient's response, side effects, and tolerance of medication regimen.  Therapeutic Interventions: 1 to 1 sessions, Unit Group sessions and Medication administration.  Evaluation of Outcomes: Progressing  Physician Treatment Plan for Secondary Diagnosis: Principal Problem:   Schizoaffective disorder, bipolar type (HCC) Active Problems:   Cannabis use disorder, moderate, dependence (HCC)   Tobacco use disorder  Long Term Goal(s): Improvement in symptoms so as ready for discharge Improvement in symptoms so as ready for discharge   Short Term Goals: Ability to identify changes in lifestyle to reduce recurrence of condition will improve Ability to identify changes in lifestyle to reduce recurrence of condition will improve     Medication Management: Evaluate patient's response, side effects, and tolerance of medication regimen.  Therapeutic Interventions: 1 to 1 sessions, Unit Group sessions and Medication administration.  Evaluation of Outcomes: Progressing   RN Treatment Plan for Primary Diagnosis: Schizoaffective disorder, bipolar type (HCC) Long Term Goal(s): Knowledge of disease and therapeutic regimen to maintain health will improve  Short Term Goals: Ability to identify and develop effective coping behaviors will improve and Compliance with prescribed medications will improve  Medication Management: RN will administer medications as ordered by provider, will assess and evaluate patient's response and provide education to patient for prescribed medication. RN will report any adverse and/or side effects to prescribing provider.  Therapeutic Interventions: 1 on 1 counseling sessions, Psychoeducation, Medication administration,  Evaluate responses to treatment, Monitor vital signs and CBGs as ordered, Perform/monitor CIWA, COWS, AIMS and Fall Risk screenings as ordered, Perform wound care treatments as ordered.  Evaluation of Outcomes: Progressing   LCSW Treatment Plan for Primary Diagnosis: Schizoaffective disorder, bipolar type (HCC) Long Term Goal(s): Safe transition to appropriate next level of care at discharge, Engage patient in therapeutic group addressing interpersonal concerns.  Short Term Goals: Engage patient in aftercare planning with referrals and resources, Identify triggers associated with mental health/substance abuse issues and Increase skills for wellness and recovery  Therapeutic Interventions: Assess for all discharge needs, 1 to 1 time with Social worker, Explore available resources and support systems, Assess for adequacy in community support network, Educate family and significant other(s) on suicide prevention, Complete Psychosocial Assessment, Interpersonal group therapy.  Evaluation of Outcomes: Progressing   Progress in Treatment: Attending groups: No. Participating in groups: No. Taking medication as prescribed: Yes. Toleration medication: Yes. Family/Significant other contact made: Yes, individual(s) contacted:  Pt's mother, Butler DenmarkDebbie Louks, has been contacted. Patient understands diagnosis: Yes. Discussing patient identified problems/goals with staff: Yes. Medical problems stabilized or resolved: No. Denies suicidal/homicidal ideation: No. Issues/concerns per patient self-inventory: No. Other:    New problem(s) identified: No, Describe:  New Short Term/Long Term Goal(s):  Patient Goals:  To feel safe.   Discharge Plan or Barriers: Pt continues to experience AH and paranoia.  She recently exhibited some aggressive behaviors.  Discharge plan continues to be that pt will likely return home with her parents and [place]  Reason for  Continuation of Hospitalization: Delusions  Hallucinations Medication stabilization  Estimated Length of Stay: 5-7 days    Attendees: Patient: 05/25/2018 12:05 PM  Physician: Kristine LineaJolanta Pucilowska, MD 05/25/2018 12:05 PM  Nursing: Hulan AmatoGwen Farrish, RN 05/25/2018 12:05 PM  RN Care Manager: 05/25/2018 12:05 PM  Social Worker: Jake SharkSara Eirik Schueler, LCSW 05/25/2018 12:05 PM  Recreational Therapist: Garret ReddishShay Outlaw, LRT 05/25/2018 12:05 PM  Other: Johny Shearsassandra Jarrett, LCSWA 05/25/2018 12:05 PM  Other: Heidi DachKelsey Craig, LCSW 05/25/2018 12:05 PM  Other: 05/25/2018 12:05 PM    Scribe for Treatment Team: Glennon MacSara P Avyan Livesay, LCSW 05/25/2018 12:05 PM

## 2018-05-26 LAB — LITHIUM LEVEL: LITHIUM LVL: 0.56 mmol/L — AB (ref 0.60–1.20)

## 2018-05-26 LAB — BASIC METABOLIC PANEL
Anion gap: 6 (ref 5–15)
BUN: 17 mg/dL (ref 6–20)
CALCIUM: 9.4 mg/dL (ref 8.9–10.3)
CO2: 29 mmol/L (ref 22–32)
CREATININE: 0.44 mg/dL (ref 0.44–1.00)
Chloride: 106 mmol/L (ref 98–111)
GFR calc non Af Amer: >60 mL/min (ref >60)
Glucose, Bld: 93 mg/dL (ref 70–99)
Potassium: 3.9 mmol/L (ref 3.5–5.1)
SODIUM: 141 mmol/L (ref 135–145)

## 2018-05-26 MED ORDER — CLOZAPINE 100 MG PO TABS
300.0000 mg | ORAL_TABLET | Freq: Every day | ORAL | Status: DC
  Administered 2018-05-26: 300 mg via ORAL
  Filled 2018-05-26: qty 3

## 2018-05-26 NOTE — Plan of Care (Signed)
Compliant with treatment but unable to program in HS activities. Pacing, restless.

## 2018-05-26 NOTE — [facility] Group Notes (Signed)
[facility] Group Notes:  (Nursing/MHT/Case Management/Adjunct)  Date:  05/26/2018  Time:  10:05 PM  Type of Therapy:  Group Therapy  Participation Level:  Did Not Attend   Summary of Progress/Problems: [name] was in the day room when group was announced. Robyne left and stated she was not staying for group. [name] stayed in room until group was over. Jinger NeighborsKeith D Leroi Haque 05/26/2018, 10:05 PM

## 2018-05-26 NOTE — [facility] Group Notes (Signed)
LCSW Group Therapy Note   05/26/2018  Time: 1PM  Type of Therapy/Topic:  Group Therapy:  Balance in Life  Participation Level:  Did Not Attend  Description of Group:   This group will address the concept of balance and how it feels and looks when one is unbalanced. Patients will be encouraged to process areas in their lives that are out of balance and identify reasons for remaining unbalanced. Facilitators will guide patients in utilizing problem-solving interventions to address and correct the stressor making their life unbalanced. Understanding and applying boundaries will be explored and addressed for obtaining and maintaining a balanced life. Patients will be encouraged to explore ways to assertively make their unbalanced needs known to significant others in their lives, using other group members and facilitator for support and feedback.  Therapeutic Goals: 1. Patient will identify two or more emotions or situations they have that consume much of in their lives. 2. Patient will identify signs/triggers that life has become out of balance:  3. Patient will identify two ways to set boundaries in order to achieve balance in their lives:  4. Patient will demonstrate ability to communicate their needs through discussion and/or role plays  Summary of Patient Progress: Pt was invited to attend group but chose not to attend. CSW will continue to encourage pt to attend group throughout their admission.     Therapeutic Modalities:   Cognitive Behavioral Therapy Solution-Focused Therapy Assertiveness Training  Heidi DachKelsey Kharlie Bring, MSW, LCSW Clinical Social Worker 05/26/2018 2:05 PM

## 2018-05-26 NOTE — Progress Notes (Signed)
Recreation Therapy Notes  Date: 05/26/2018  Time: 3:00pm  Location: Craft room  Behavioral response: N/A  Group Type: Craft  Participation level: N/A  Communication: Patient did not attend group.  Comments: N/A  Cresencio Reesor LRT/CTRS        Narya Beavin 05/26/2018 3:33 PM 

## 2018-05-26 NOTE — Progress Notes (Addendum)
[facility] MD Progress Note  05/27/2018 10:03 AM Kimberly Boyle  MRN:  [id number]  Subjective:    Kimberly Boyle is a 22 year old female with a history of schizoaffective disorder admitted floridly psychotic in the context of treatment noncompliance. She is still paranoid and delusional on Clozapine titration.  [name], as usual, slept through breakfast to demand her Ensure and Gatorade. She is paranoid, isolative, disengaged. Yesterday she was noted using a bathroom in another patient's room. Her mother visited last night and the patient thinks that the visit went well. We continue Clozapine titration and she seems to tolerate it well except for sedation in the morning.  Spoke with her father. The mother is in court today to apply for guardianship, letter of support was provided. Reportedly, she had a good interaction with [name]. She will visit this weekend. [name] is too paranoid to sign any release forms, including ACT team. [name] would be invaluable.   Principal Problem: Schizoaffective disorder, bipolar type ([place]) Diagnosis:   Patient Active Problem List   Diagnosis Date Noted  . Schizoaffective disorder, bipolar type ([place]) [F25.0] 05/06/2018    Priority: High  . Tobacco use disorder [F17.200] 05/08/2018  . Suicide attempt ([place]) [T14.91XA] 08/20/2017  . Cannabis use disorder, moderate, dependence ([place]) [F12.20] 08/20/2017  . Self-inflicted laceration of wrist [S61.519A] 08/20/2017  . Acetaminophen poisoning [T39.1X1A] 08/20/2017  . Generalized anxiety disorder [F41.1] 01/05/2017  . Mood disorder ([place]) [F39] 01/05/2017   Total Time spent with patient: 20 minutes  Past Psychiatric History: schizoafective disorder  Past Medical History:  Past Medical History:  Diagnosis Date  . Anxiety   . Arthritis   . Bipolar disorder ([place])   . Depression   . Major depressive disorder   . Panic disorder   . Schizophrenia ([place])    History reviewed. No pertinent surgical history. Family  History:  Family History  Problem Relation Age of Onset  . Post-traumatic stress disorder Mother   . Depression Mother   . Depression Maternal Uncle   . Cancer Maternal Grandmother    Family Psychiatric  History: none Social History:  Social History   Substance and Sexual Activity  Alcohol Use No  . Frequency: Never     Social History   Substance and Sexual Activity  Drug Use No  . Types: Marijuana    Social History   Socioeconomic History  . Marital status: Single    Spouse name: Not on file  . Number of children: 0  . Years of education: Not on file  . Highest education level: High school graduate  Occupational History    Comment: unemployed  Social Needs  . Financial resource strain: Not on file  . Food insecurity:    Worry: Not on file    Inability: Not on file  . Transportation needs:    Medical: No    Non-medical: No  Tobacco Use  . Smoking status: Current Every Day Smoker    Packs/day: 0.25    Types: Cigarettes  . Smokeless tobacco: Never Used  Substance and Sexual Activity  . Alcohol use: No    Frequency: Never  . Drug use: No    Types: Marijuana  . Sexual activity: Not Currently  Lifestyle  . Physical activity:    Days per week: 0 days    Minutes per session: 0 min  . Stress: To some extent  Relationships  . Social connections:    Talks on phone: Not on file    Gets together: Not on  file    Attends religious service: Never    Active member of club or organization: No    Attends meetings of clubs or organizations: Never    Relationship status: Never married  Other Topics Concern  . Not on file  Social History Narrative   ** Merged History Encounter **       Additional Social History:                         Sleep: Fair  Appetite:  Fair  Current Medications: Current Facility-Administered Medications  Medication Dose Route Frequency Provider Last Rate Last Dose  . acetaminophen (TYLENOL) tablet 650 mg  650 mg Oral Q6H PRN  [name], Madie Reno, MD   650 mg at 05/26/18 1815  . alum & mag hydroxide-simeth (MAALOX/MYLANTA) 200-200-20 MG/5ML suspension 30 mL  30 mL Oral Q4H PRN [name], [name], MD      . clonazePAM Bobbye [place]) tablet 0.5 mg  0.5 mg Oral BID PRN Chauncey Mann, MD   0.5 mg at 05/26/18 2124  . cloZAPine (CLOZARIL) tablet 300 mg  300 mg Oral QHS Safaa Stingley B, MD   300 mg at 05/26/18 2124  . diphenhydrAMINE (BENADRYL) injection 50 mg  50 mg Intramuscular Q6H PRN Arali Somera B, MD   50 mg at 05/18/18 1706  . docusate sodium (COLACE) capsule 100 mg  100 mg Oral Daily Jasiri Hanawalt B, MD   100 mg at 05/27/18 0941  . feeding supplement (ENSURE ENLIVE) (ENSURE ENLIVE) liquid 237 mL  237 mL Oral TID BM Ambur Province B, MD   237 mL at 05/27/18 0941  . gabapentin (NEURONTIN) capsule 300 mg  300 mg Oral TID Jerrian Mells B, MD   300 mg at 05/27/18 0941  . haloperidol lactate (HALDOL) injection 5 mg  5 mg Intramuscular Q6H PRN Terris Germano B, MD   5 mg at 05/18/18 1706  . lithium carbonate capsule 750 mg  750 mg Oral BID WC Kilan Banfill B, MD   750 mg at 05/27/18 0941  . LORazepam (ATIVAN) injection 2 mg  2 mg Intramuscular Q6H PRN Amijah Timothy B, MD   2 mg at 05/18/18 1705  . magnesium hydroxide (MILK OF MAGNESIA) suspension 30 mL  30 mL Oral Daily PRN [name], [name], MD      . nicotine (NICODERM CQ - dosed in mg/24 hours) patch 21 mg  21 mg Transdermal Daily [name], Madie Reno, MD   21 mg at 05/22/18 0834  . paliperidone (INVEGA SUSTENNA) injection 234 mg  234 mg Intramuscular Q28 days Nawal Burling B, MD   234 mg at 05/12/18 1737    Lab Results:  Results for orders placed or performed during the hospital encounter of 05/06/18 (from the past 48 hour(s))  Lithium level     Status: Abnormal   Collection Time: 05/26/18  7:00 AM  Result Value Ref Range   Lithium Lvl 0.56 (L) 0.60 - 1.20 mmol/L    Comment: Performed at [facility], [address]  [place], [place] [address]  Basic metabolic panel     Status: None   Collection Time: 05/26/18  7:00 AM  Result Value Ref Range   Sodium 141 135 - 145 mmol/L   Potassium 3.9 3.5 - 5.1 mmol/L   Chloride 106 98 - 111 mmol/L   CO2 29 22 - 32 mmol/L   Glucose, Bld 93 70 - 99 mg/dL   BUN 17 6 -  20 mg/dL   Creatinine, Ser 0.44 0.44 - 1.00 mg/dL   Calcium 9.4 8.9 - 10.3 mg/dL   GFR calc non Af Amer >60 >60 mL/min   GFR calc Af Amer >60 >60 mL/min    Comment: (NOTE) The eGFR has been calculated using the CKD EPI equation. This calculation has not been validated in all clinical situations. eGFR's persistently <60 mL/min signify possible Chronic Kidney Disease.    Anion gap 6 5 - 15    Comment: Performed at [facility], [address] [place], [place] [address]    Blood Alcohol level:  Lab Results  Component Value Date   [facility] <10 05/05/2018   ETH <10 73/42/8768    Metabolic Disorder Labs: Lab Results  Component Value Date   HGBA1C 5.1 05/05/2018   MPG 99.67 05/05/2018   Lab Results  Component Value Date   PROLACTIN 132.8 (H) 10/25/2017   Lab Results  Component Value Date   CHOL 190 05/07/2018   TRIG 112 05/07/2018   HDL 44 05/07/2018   CHOLHDL 4.3 05/07/2018   VLDL 22 05/07/2018   LDLCALC 124 (H) 05/07/2018   LDLCALC 72 10/25/2017    Physical Findings: AIMS: Facial and Oral Movements Muscles of Facial Expression: None, normal Lips and Perioral Area: None, normal Jaw: None, normal Tongue: None, normal,Extremity Movements Upper (arms, wrists, hands, fingers): None, normal Lower (legs, knees, ankles, toes): None, normal, Trunk Movements Neck, shoulders, hips: None, normal, Overall Severity Severity of abnormal movements (highest score from questions above): None, normal Incapacitation due to abnormal movements: None, normal Patient's awareness of abnormal movements (rate only patient's report): No Awareness, Dental Status Current problems with teeth and/or  dentures?: No Does patient usually wear dentures?: No  CIWA:    COWS:     Musculoskeletal: Strength & Muscle Tone: within normal limits Gait & Station: normal Patient leans: N/A  Psychiatric Specialty Exam: Physical Exam  Nursing note and vitals reviewed. Psychiatric: Her speech is normal. Her affect is blunt. She is withdrawn. Thought content is paranoid and delusional. Cognition and memory are impaired. She expresses impulsivity.    Review of Systems  Neurological: Negative.   Psychiatric/Behavioral: The patient is nervous/anxious.   All other systems reviewed and are negative.   Blood pressure 122/73, pulse 87, temperature 97.6 F (36.4 C), temperature source Oral, resp. rate 16, height '5\' 2"'  (1.575 m), weight 63.5 kg, SpO2 97 %.Body mass index is 25.61 kg/m.  General Appearance: Disheveled  Eye Contact:  Good  Speech:  Clear and Coherent  Volume:  Normal  Mood:  Euthymic  Affect:  Blunt  Thought Process:  Irrelevant and Descriptions of Associations: Tangential  Orientation:  Full (Time, Place, and Person)  Thought Content:  Delusions and Paranoid Ideation  Suicidal Thoughts:  No  Homicidal Thoughts:  No  Memory:  Immediate;   Fair Recent;   Fair Remote;   Fair  Judgement:  Poor  Insight:  Lacking  Psychomotor Activity:  Psychomotor Retardation  Concentration:  Concentration: Poor and Attention Span: Poor  Recall:  Poor  Fund of Knowledge:  Poor  Language:  Poor  Akathisia:  No  Handed:  Right  AIMS (if indicated):     Assets:  Communication Skills Desire for Improvement Financial Resources/Insurance Housing Physical Health Resilience Social Support  ADL's:  Intact  Cognition:  WNL  Sleep:  Number of Hours: 6.5     Treatment Plan Summary: Daily contact with patient to assess and evaluate symptoms and progress in treatment  and Medication management   Ms. Omar is a 22 year old female with a history of schizoaffective disorder admitted for auditory  hallucinations and paranoia in the context of medication noncompliance. She is currently on two antipsychoticsin addition to a mood stabilizerwith some improvement.  #Schizoaffective disorder, improvement -continueLithium 750 mg twice daily,Lithium level was 0.55 on 8/6, will recheck level -continuemonthly Invega sustenna 234 mg injections,next dose 8/28 -increase Clozapine to 328m nightlytoday and 400 mg over the weekend  #Anxiety -continueNeurontin 300 mg TID -decrease Clonazepam to 0.5 mg BID PRN   #Cannabis use disorder -She minimizes the use of marijuana and is not interested in any substance abuse treatment -She was advised to abstain from marijuana and all illicit drugs as they may worsen psychosis  Diet #Poor oral intake -Ensure TID  #Smoking cessation -nicotine patch is availabe  #Labs -lipid panel, TSH, [facility] normal -EKGreviewed, NSR with QTc of 420 -pregnancy test is negative  #Disposition -discharge to home with her parents -needs follow up with [facility] [place] private insurance -needs a guardian and disability -will ask for 90 days of outpatient commitment  JOrson Slick MD 05/27/2018, 10:03 AM

## 2018-05-26 NOTE — Plan of Care (Signed)
Patient slept till lunch time.Patient came to staff to get something for "anxiety."Patient states "doctor did not believe me when I told her that someone is pushing my eyes with her thumb.I am going to sue everyone except you."Patient is very intrusive to get the ensure.Denies SI,HI and AVH.Did not attend groups.Support and encouragement given.

## 2018-05-26 NOTE — Progress Notes (Addendum)
Patient continues to pace, unable to program in HS activities. Frequently coming to the nurses station, requesting ensure and other services. Disheveled and disorganized. Patient is frequently redirected. She is pleasant and cooperative. Received ensure and snack and HS medications. Did not have any medical concern. Denies pain and distress. Currently in bed resting. Staff continue to monitor for safety and other needs.  0700: Kimberly Boyle slept all night and had no sign of discomfort. Out of bed this morning. Labs done. Pleasant and cooperative. Staff continue to monitor.

## 2018-05-26 NOTE — B[facility] Group Notes (Signed)
LCSW Group Therapy Note 05/26/2018 9:00 AM  Type of Therapy and Topic:  Group Therapy:  Setting Goals  Participation Level:  Did Not Attend  Description of Group: In this process group, patients discussed using strengths to work toward goals and address challenges.  Patients identified two positive things about themselves and one goal they were working on.  Patients were given the opportunity to share openly and support each other's plan for self-empowerment.  The group discussed the value of gratitude and were encouraged to have a daily reflection of positive characteristics or circumstances.  Patients were encouraged to identify a plan to utilize their strengths to work on current challenges and goals.  Therapeutic Goals 1. Patient will verbalize personal strengths/positive qualities and relate how these can assist with achieving desired personal goals 2. Patients will verbalize affirmation of peers plans for personal change and goal setting 3. Patients will explore the value of gratitude and positive focus as related to successful achievement of goals 4. Patients will verbalize a plan for regular reinforcement of personal positive qualities and circumstances.  Summary of Patient Progress:  Kimberly Boyle was invited to today's group, but chose not to attend.     Therapeutic Modalities Cognitive Behavioral Therapy Motivational Interviewing    Kimberly Boyle, [place] 05/26/2018 2:09 PM

## 2018-05-26 NOTE — Progress Notes (Signed)
Recreation Therapy Notes  Date: 05/26/2018  Time: 9:30 am   Location: Craft Room   Behavioral response: N/A   Intervention Topic: Coping Skills  Discussion/Intervention: Patient did not attend group.   Clinical Observations/Feedback:  Patient did not attend group.   Albi Rappaport LRT/CTRS        Kimberly Boyle 05/26/2018 10:49 AM 

## 2018-05-27 MED ORDER — CLOZAPINE 100 MG PO TABS
400.0000 mg | ORAL_TABLET | Freq: Every day | ORAL | Status: DC
  Administered 2018-05-29 – 2018-06-07 (×10): 400 mg via ORAL
  Filled 2018-05-27 (×10): qty 4

## 2018-05-27 MED ORDER — CLOZAPINE 25 MG PO TABS
350.0000 mg | ORAL_TABLET | Freq: Every day | ORAL | Status: AC
  Administered 2018-05-27 – 2018-05-28 (×2): 350 mg via ORAL
  Filled 2018-05-27 (×2): qty 3

## 2018-05-27 NOTE — Plan of Care (Signed)
D: Patient denies SI/HI/AVH. Patient verbally contracts for safety. Patient is calm, cooperative and pleasant. Patient isolated to room for most of the day. Patient has no complaints at this time.  A: Patient was assessed by this nurse. Patient was oriented to unit. Patient's safety was maintained on unit. Q x 15 minute observation checks were completed for safety. Patient care plan was reviewed. Patient was offered support and encouragement. Patient was encourage to attend groups, participate in unit activities and continue with plan of care.    R: Patient has no complaints of pain at this time. Patient is receptive to treatment and safety maintained on unit.     Problem: Safety: Goal: Ability to remain free from injury will improve Outcome: Progressing   Problem: Self-Concept: Goal: Will verbalize positive feelings about self Outcome: Progressing

## 2018-05-27 NOTE — Plan of Care (Signed)
  Problem: Coping: Goal: Coping ability will improve Outcome: Progressing  Patient is coping appropriately. 

## 2018-05-27 NOTE — Progress Notes (Signed)
patient alert and oriented x 4, denies SI/HI/AVH, affect is flat and sad, she appears less anxious although intrusive and very demanding with multiple request at different times Patient is not interacting with peers appropriately, an instance occurred where patient was noted using another patient's bathroom In their room. Patient was approached, corrected and educated that such behaviors was prohibited and should not do that again. Patient is complaint with medication, 15 minutes safety checks maintained will continue to monitor closely.

## 2018-05-27 NOTE — B[facility] Group Notes (Signed)
  05/27/2018  Time: 1PM  Type of Therapy and Topic:  Group Therapy:  Feelings around Relapse and Recovery  Participation Level:  Did Not Attend   Description of Group:    Patients in this group will discuss emotions they experience before and after a relapse. They will process how experiencing these feelings, or avoidance of experiencing them, relates to having a relapse. Facilitator will guide patients to explore emotions they have related to recovery. Patients will be encouraged to process which emotions are more powerful. They will be guided to discuss the emotional reaction significant others in their lives may have to their relapse or recovery. Patients will be assisted in exploring ways to respond to the emotions of others without this contributing to a relapse.  Therapeutic Goals: 1. Patient will identify two or more emotions that lead to a relapse for them 2. Patient will identify two emotions that result when they relapse 3. Patient will identify two emotions related to recovery 4. Patient will demonstrate ability to communicate their needs through discussion and/or role plays   Summary of Patient Progress: Pt was invited to attend group but chose not to attend. CSW will continue to encourage pt to attend group throughout their admission.    Therapeutic Modalities:   Cognitive Behavioral Therapy Solution-Focused Therapy Assertiveness Training Relapse Prevention Therapy  Heidi DachKelsey Sulma Ruffino, MSW, LCSW Clinical Social Worker 05/27/2018 1:38 PM

## 2018-05-27 NOTE — Progress Notes (Signed)
Recreation Therapy Notes  Date: 05/27/2018  Time: 9:30 am   Location: Craft Room   Behavioral response: N/A   Intervention Topic: Leisure Skills  Discussion/Intervention: Patient did not attend group.   Clinical Observations/Feedback:  Patient did not attend group.   Thecla Forgione LRT/CTRS         Soleil Mas 05/27/2018 12:27 PM

## 2018-05-28 MED ORDER — NICOTINE POLACRILEX 2 MG MT GUM
2.0000 mg | CHEWING_GUM | OROMUCOSAL | Status: DC | PRN
  Administered 2018-05-30: 2 mg via ORAL
  Filled 2018-05-28: qty 1

## 2018-05-28 NOTE — B[facility] Group Notes (Signed)
LCSW Group Therapy Note  05/28/2018 1:15pm  Type of Therapy and Topic:  Group Therapy:  Healthy Self Image and Positive Change  Participation Level:  Did Not Attend   Description of Group:  In this group, patients will compare and contrast their current "I am...." statements to the visions they identify as desirable for their lives.  Patients discuss fears and how they can make positive changes in their cognitions that will positively impact their behaviors.  Facilitator played a motivational 3-minute speech and patients were left with the task of thinking about what "I am...." statements they can start using in their lives immediately.  Therapeutic Goals: 1. Patient will state their current self-perception as expressed in an "I Am" statement 2. Patient will contrast this with their desired vision for their live 3. Patient will identify 3 fears that negatively impact their behavior 4. Patient will discuss cognitive distortions that stem from their fears 5. Patient will verbalize statements that challenge their cognitive distortions  Summary of Patient Progress: Pt was invited to attend group but chose not to attend. CSW will continue to encourage pt to attend group throughout their admission.     Therapeutic Modalities Cognitive Behavioral Therapy Motivational Interviewing  Kimberly Maione  CUEBAS-COLON, LCSW 05/28/2018 12:16 PM 

## 2018-05-28 NOTE — Plan of Care (Signed)
  Problem: Coping: Goal: Coping ability will improve Outcome: Progressing  Patient is coping appropriately. 

## 2018-05-28 NOTE — Plan of Care (Signed)
  Problem: Coping: Goal: Coping ability will improve 05/28/2018 0426 by Trula OreAjetunmobi, Reannah Totten Abisola, RN Outcome: Progressing 05/28/2018 0421 by Trula OreAjetunmobi, Geddy Boydstun Abisola, RN Outcome: Progressing  Patient is coping effectively.

## 2018-05-28 NOTE — Progress Notes (Signed)
[facility]  05/28/2018 3:21 PM Kimberly Boyle  MRN:  [id number]  Subjective:    Kimberly Boyle is a 22 year old female with a history of schizoaffective disorder admitted floridly psychotic in the context of treatment noncompliance.   " can I get nicotine gum and Ensure?" Pt continue to  be isolative in her room, guarded, paranoid, denies SI/HI. Pt requesting nicotine gum, and Chocolate Ensure.   Principal Problem: Schizoaffective disorder, bipolar type (HCC) Diagnosis:   Patient Active Problem List   Diagnosis Date Noted  . Tobacco use disorder [F17.200] 05/08/2018  . Schizoaffective disorder, bipolar type (HCC) [F25.0] 05/06/2018  . Suicide attempt (HCC) [T14.91XA] 08/20/2017  . Cannabis use disorder, moderate, dependence (HCC) [F12.20] 08/20/2017  . Self-inflicted laceration of wrist [S61.519A] 08/20/2017  . Acetaminophen poisoning [T39.1X1A] 08/20/2017  . Generalized anxiety disorder [F41.1] 01/05/2017  . Mood disorder (HCC) [F39] 01/05/2017   Total Time spent with patient: 25  min  Past Psychiatric History: schizoafective disorder  Past Medical History:  Past Medical History:  Diagnosis Date  . Anxiety   . Arthritis   . Bipolar disorder (HCC)   . Depression   . Major depressive disorder   . Panic disorder   . Schizophrenia (HCC)    History reviewed. No pertinent surgical history. Family History:  Family History  Problem Relation Age of Onset  . Post-traumatic stress disorder Mother   . Depression Mother   . Depression Maternal Uncle   . Cancer Maternal Grandmother    Family Psychiatric  History: none Social History:  Social History   Substance and Sexual Activity  Alcohol Use No  . Frequency: Never     Social History   Substance and Sexual Activity  Drug Use No  . Types: Marijuana    Social History   Socioeconomic History  . Marital status: Single    Spouse name: Not on file  . Number of children: 0  . Years of education: Not on file   . Highest education level: High school graduate  Occupational History    Comment: unemployed  Social Needs  . Financial resource strain: Not on file  . Food insecurity:    Worry: Not on file    Inability: Not on file  . Transportation needs:    Medical: No    Non-medical: No  Tobacco Use  . Smoking status: Current Every Day Smoker    Packs/day: 0.25    Types: Cigarettes  . Smokeless tobacco: Never Used  Substance and Sexual Activity  . Alcohol use: No    Frequency: Never  . Drug use: No    Types: Marijuana  . Sexual activity: Not Currently  Lifestyle  . Physical activity:    Days per week: 0 days    Minutes per session: 0 min  . Stress: To some extent  Relationships  . Social connections:    Talks on phone: Not on file    Gets together: Not on file    Attends religious service: Never    Active member of club or organization: No    Attends meetings of clubs or organizations: Never    Relationship status: Never married  Other Topics Concern  . Not on file  Social History Narrative   ** Merged History Encounter **       Additional Social History:                         Sleep: Fair  Appetite:  Fair  Current Medications: Current Facility-Administered Medications  Medication Dose Route Frequency Provider Last Rate Last Dose  . acetaminophen (TYLENOL) tablet 650 mg  650 mg Oral Q6H PRN [name], [name], MD   650 mg at 05/26/18 1815  . alum & mag hydroxide-simeth (MAALOX/MYLANTA) 200-200-20 MG/5ML suspension 30 mL  30 mL Oral Q4H PRN [name], [name], MD      . clonazePAM Scarlette Calico) tablet 0.5 mg  0.5 mg Oral BID PRN Darliss Ridgel, MD   0.5 mg at 05/28/18 1452  . cloZAPine (CLOZARIL) tablet 350 mg  350 mg Oral QHS Pucilowska, [name], MD   350 mg at 05/27/18 2139  . [START ON 05/29/2018] cloZAPine (CLOZARIL) tablet 400 mg  400 mg Oral QHS Pucilowska, [name], MD      . docusate sodium (COLACE) capsule 100 mg  100 mg Oral Daily Pucilowska, [name], MD    100 mg at 05/28/18 0823  . feeding supplement (ENSURE ENLIVE) (ENSURE ENLIVE) liquid 237 mL  237 mL Oral TID BM Kimberly Sessions, MD   237 mL at 05/28/18 1444  . gabapentin (NEURONTIN) capsule 300 mg  300 mg Oral TID Pucilowska, [name], MD   300 mg at 05/28/18 1156  . lithium carbonate capsule 750 mg  750 mg Oral BID WC Pucilowska, [name], MD   750 mg at 05/28/18 0823  . magnesium hydroxide (MILK OF MAGNESIA) suspension 30 mL  30 mL Oral Daily PRN [name], [name], MD      . nicotine (NICODERM CQ - dosed in mg/24 hours) patch 21 mg  21 mg Transdermal Daily [name], [name], MD   21 mg at 05/22/18 0834  . nicotine polacrilex (NICORETTE) gum 2 mg  2 mg Oral Q4H PRN Kimberly Sessions, MD      . paliperidone (INVEGA SUSTENNA) injection 234 mg  234 mg Intramuscular Q28 days Pucilowska, [name], MD   234 mg at 05/12/18 1737    Lab Results:  No results found for this or any previous visit (from the past 48 hour(s)).  Blood Alcohol level:  Lab Results  Component Value Date   ETH <10 05/05/2018   ETH <10 10/12/2017    Metabolic Disorder Labs: Lab Results  Component Value Date   HGBA1C 5.1 05/05/2018   MPG 99.67 05/05/2018   Lab Results  Component Value Date   PROLACTIN 132.8 (H) 10/25/2017   Lab Results  Component Value Date   CHOL 190 05/07/2018   TRIG 112 05/07/2018   HDL 44 05/07/2018   CHOLHDL 4.3 05/07/2018   VLDL 22 05/07/2018   LDLCALC 124 (H) 05/07/2018   LDLCALC 72 10/25/2017    Physical Findings: AIMS: Facial and Oral Movements Muscles of Facial Expression: None, normal Lips and Perioral Area: None, normal Jaw: None, normal Tongue: None, normal,Extremity Movements Upper (arms, wrists, hands, fingers): None, normal Lower (legs, knees, ankles, toes): None, normal, Trunk Movements Neck, shoulders, hips: None, normal, Overall Severity Severity of abnormal movements (highest score from questions above): None, normal Incapacitation due to abnormal movements: None,  normal Patient's awareness of abnormal movements (rate only patient's report): No Awareness, Dental Status Current problems with teeth and/or dentures?: No Does patient usually wear dentures?: No  CIWA:    COWS:     Musculoskeletal: Strength & Muscle Tone: within normal limits Gait & Station: normal Patient leans: N/A  Psychiatric Specialty Exam: Physical Exam  Nursing note and vitals reviewed. Psychiatric: Her speech is normal. Her affect is blunt. She  is withdrawn. Thought content is paranoid and delusional. Cognition and memory are impaired. She expresses impulsivity.    Review of Systems  Neurological: Negative.   Psychiatric/Behavioral: The patient is nervous/anxious.   All other systems reviewed and are negative.   Blood pressure 122/73, pulse 87, temperature 97.6 F (36.4 C), temperature source Oral, resp. rate 16, height 5\' 2"  (1.575 m), weight 63.5 kg, SpO2 97 %.Body mass index is 25.61 kg/m.  General Appearance: Disheveled  Eye Contact:  Good  Speech:  Clear and Coherent  Volume:  Normal  Mood:  Euthymic  Affect:  restricted  Thought Process:  Irrelevant and Descriptions of Associations: Tangential  Orientation:  Full (Time, Place, and Person)  Thought Content:  Delusions and Paranoid Ideation  Suicidal Thoughts:  denies  Homicidal Thoughts:  No  Memory:  Immediate;   Fair Recent;   Fair Remote;   Fair  Judgement:  Poor  Insight:  Lacking  Psychomotor Activity:  Psychomotor Retardation  Concentration:  Concentration: Poor and Attention Span: Poor  Recall:  Poor  Fund of Knowledge:  Poor  Language:  Poor  Akathisia:  No  Handed:  Right  AIMS (if indicated):     Assets:  Communication Skills Desire for Improvement Financial Resources/Insurance Housing Physical Health Resilience Social Support  ADL's:  Intact  Cognition:  WNL  Sleep:  Number of Hours: 5.15     Treatment Plan Summary: Daily contact with patient to assess and evaluate symptoms and  progress in treatment and Medication management   Kimberly Boyle is a 22 year old female with a history of schizoaffective disorder admitted for auditory hallucinations and paranoia in the context of medication noncompliance. Pt still psychotic,  #Schizoaffective disorder, improvement -continueLithium 750 mg twice daily,Lithium level was 0.55 on 8/6, will recheck level -continuemonthly Invega sustenna 234 mg injections,next dose 8/28 - Clozapine increased  Yesterday to 350mg  nightly and will increase to 400 mg on sunday   Diet #Poor oral intake -Ensure TID  #Smoking cessation -nicotine patch is available,. Add gum   #Labs -lipid panel, TSH, A1Care normal -EKGreviewed, NSR with QTc of 420, rpt ekg for QTc -pregnancy test is negative  #Disposition- per primary team  Kimberly SessionsJagannath Corliss Coggeshall, MD 05/28/2018, 3:21 PMPatient ID: Kimberly GenerousSarah Belle Boyle, female   DOB: 02/23/1996, 22 y.o.   MRN: [id number]

## 2018-05-28 NOTE — Progress Notes (Signed)
Patient alert and oriented x 4, denies SI/HI/AVH, affect is flat and sad, her thoughts are organized and coherent, she endorsed anxiety and was given medication PRN, patient is also intrusive and very demanding with multiple request at different times, her interaction sometimes is childlike and often she likes to mimic other people and request what other patients request. Patient is nteracting with peers appropriately,and  complaint with medication, 15 minutes safety checks maintained will continue to monitor closely.

## 2018-05-28 NOTE — Plan of Care (Signed)
Data: Patient isolates to room, forwards little. Patient is in milieu only to ask for ensures and PRN medications. Patient denies SI/HI and AVH. Patient has not completed daily self inventory worksheet. Patient is flat in affect, interaction is minimal. Patient has to be frequently educated on acceptable practices in milieu, patient is caught multiple times attempting to take peers food and drinks. Patient is witnessed entering other patient's room without explanation.   Action:  Q x 15 minute observation checks were completed for safety. Patient was provided with education on medications. Patient was offered support and encouragement. Patient was given scheduled medications. Patient  was encourage to attend groups, participate in unit activities and continue with plan of care.     Response: Patient is compliant with medication. Patient is receptive to treatment and safety maintained on unit.    Problem: Safety: Goal: Ability to remain free from injury will improve Outcome: Progressing   Problem: Education: Goal: Knowledge of [facility] Education information/materials will improve Outcome: Not Progressing Goal: Mental status will improve Outcome: Not Progressing   Problem: Coping: Goal: Coping ability will improve Outcome: Not Progressing Goal: Will verbalize feelings Outcome: Not Progressing   Problem: Self-Concept: Goal: Will verbalize positive feelings about self Outcome: Not Progressing   Problem: Activity: Goal: Interest or engagement in leisure activities will improve Outcome: Not Progressing Goal: Imbalance in normal sleep/wake cycle will improve Outcome: Not Progressing

## 2018-05-29 NOTE — Plan of Care (Signed)
Visible in the milieu, knowledgeable of her treatment plan

## 2018-05-29 NOTE — [facility] Group Notes (Signed)
LCSW Group Therapy Note 05/29/2018 1:15pm  Type of Therapy and Topic: Group Therapy: Feelings Around Returning Home & Establishing a Supportive Framework and Supporting Oneself When Supports Not Available  Participation Level: Did Not Attend  Description of Group:  Patients first processed thoughts and feelings about upcoming discharge. These included fears of upcoming changes, lack of change, new living environments, judgements and expectations from others and overall stigma of mental health issues. The group then discussed the definition of a supportive framework, what that looks and feels like, and how do to discern it from an unhealthy non-supportive network. The group identified different types of supports as well as what to do when your family/friends are less than helpful or unavailable  Therapeutic Goals  1. Patient will identify one healthy supportive network that they can use at discharge. 2. Patient will identify one factor of a supportive framework and how to tell it from an unhealthy network. 3. Patient able to identify one coping skill to use when they do not have positive supports from others. 4. Patient will demonstrate ability to communicate their needs through discussion and/or role plays.  Summary of Patient Progress:  Pt was invited to attend group but chose not to attend. CSW will continue to encourage pt to attend group throughout their admission.   Therapeutic Modalities Cognitive Behavioral Therapy Motivational Interviewing   Khamil Lamica  CUEBAS-COLON, LCSW 05/29/2018 12:46 PM 

## 2018-05-29 NOTE — Plan of Care (Signed)
Alert and oriented. Hygiene improved. Compliant with treatment

## 2018-05-29 NOTE — Plan of Care (Signed)
D:Patient observed in her room in bed all of day. Patient with a flat affect. Patient denies SI/HI and A/V hallucinations. Patient is cooperative. Patient did not want to get out of bed to go to meals. A:Patient provided support and encouragement. Patient taking medications as prescribed. Patient with Q 15 minute checks in progress and remains safe on unit. Patient encouraged to attend group and offered to go outside with peers and staff.  R:Patient remains safe on unit. Patient did not attend group or go outside with peers. Patient remained in room all day and slept. Patient remains safe on unit. Monitoring continues.   Problem: Safety: Goal: Ability to remain free from injury will improve Outcome: Progressing   Problem: Self-Concept: Goal: Level of anxiety will decrease Outcome: Progressing

## 2018-05-29 NOTE — Progress Notes (Signed)
[name] continues to exhibit improvement in thought process. Denies hallucinations. Denies thoughts of self harm. Thought process organized. Taking medications as prescribed. Expressing readiness for discharge. Patient took a shower, had a snack and received HS medications. Received Clonopin when she complained of anxiety. Pulse 112, checked manually. No major concern. Staff continue to provide support and encouragements.

## 2018-05-29 NOTE — [facility] Group Notes (Signed)
[facility] Group Notes:  (Nursing/MHT/Case Management/Adjunct)  Date:  05/29/2018  Time:  12:16 AM  Type of Therapy:  Group Therapy  Participation Level:  Did Not Attend    Kimberly NeighborsKeith D Liv Boyle 05/29/2018, 12:16 AM

## 2018-05-29 NOTE — Progress Notes (Signed)
Patient stayed in the milieu until bedtime. Frequently walking in the hallway and presenting to the nurses station to request ensure and other services. Patient had a snack and presented to the medication room, complaining of anxiety. Requested Clonopin along with bedtime medications. Did not have any other concern. Currently in bed sleeping. Staff continue to monitor.

## 2018-05-29 NOTE — Progress Notes (Signed)
Patient vitals at 1540 pulse 125 B/P 145/86 Dr. Joseph ArtSubedi informed and this writer instructed to recheck vitals in one hour. At 1641 B/P 124/88 and pulse 140 Dr. Joseph ArtSubedi aware and this writer instructed to check pulse manually and if still elevated to give prn medication for anxiety. Patient pulse was 100 at 1720 will continue to monitor and inform next shift of instructions.

## 2018-05-29 NOTE — Progress Notes (Signed)
[facility] MD Progress Note  05/29/2018 4:16 PM Arley Garant  MRN:  [id number]  Subjective:    Ms. Peraza is a 22 year old female with a history of schizoaffective disorder admitted floridly psychotic in the context of treatment noncompliance.   " I'm ok" Pt continue to  be isolative in her room, guarded, paranoid, denies SI/HI. Pt taking meds, denies side effects.    Principal Problem: Schizoaffective disorder, bipolar type (HCC) Diagnosis:   Patient Active Problem List   Diagnosis Date Noted  . Tobacco use disorder [F17.200] 05/08/2018  . Schizoaffective disorder, bipolar type (HCC) [F25.0] 05/06/2018  . Suicide attempt (HCC) [T14.91XA] 08/20/2017  . Cannabis use disorder, moderate, dependence (HCC) [F12.20] 08/20/2017  . Self-inflicted laceration of wrist [S61.519A] 08/20/2017  . Acetaminophen poisoning [T39.1X1A] 08/20/2017  . Generalized anxiety disorder [F41.1] 01/05/2017  . Mood disorder (HCC) [F39] 01/05/2017   Total Time spent with patient: 25  min  Past Psychiatric History: schizoafective disorder  Past Medical History:  Past Medical History:  Diagnosis Date  . Anxiety   . Arthritis   . Bipolar disorder (HCC)   . Depression   . Major depressive disorder   . Panic disorder   . Schizophrenia (HCC)    History reviewed. No pertinent surgical history. Family History:  Family History  Problem Relation Age of Onset  . Post-traumatic stress disorder Mother   . Depression Mother   . Depression Maternal Uncle   . Cancer Maternal Grandmother    Family Psychiatric  History: none Social History:  Social History   Substance and Sexual Activity  Alcohol Use No  . Frequency: Never     Social History   Substance and Sexual Activity  Drug Use No  . Types: Marijuana    Social History   Socioeconomic History  . Marital status: Single    Spouse name: Not on file  . Number of children: 0  . Years of education: Not on file  . Highest education level: High school  graduate  Occupational History    Comment: unemployed  Social Needs  . Financial resource strain: Not on file  . Food insecurity:    Worry: Not on file    Inability: Not on file  . Transportation needs:    Medical: No    Non-medical: No  Tobacco Use  . Smoking status: Current Every Day Smoker    Packs/day: 0.25    Types: Cigarettes  . Smokeless tobacco: Never Used  Substance and Sexual Activity  . Alcohol use: No    Frequency: Never  . Drug use: No    Types: Marijuana  . Sexual activity: Not Currently  Lifestyle  . Physical activity:    Days per week: 0 days    Minutes per session: 0 min  . Stress: To some extent  Relationships  . Social connections:    Talks on phone: Not on file    Gets together: Not on file    Attends religious service: Never    Active member of club or organization: No    Attends meetings of clubs or organizations: Never    Relationship status: Never married  Other Topics Concern  . Not on file  Social History Narrative   ** Merged History Encounter **       Additional Social History:                         Sleep: Fair  Appetite:  Fair  Current Medications: Current  Facility-Administered Medications  Medication Dose Route Frequency Provider Last Rate Last Dose  . acetaminophen (TYLENOL) tablet 650 mg  650 mg Oral Q6H PRN [name], Jackquline DenmarkJohn T, MD   650 mg at 05/26/18 1815  . alum & mag hydroxide-simeth (MAALOX/MYLANTA) 200-200-20 MG/5ML suspension 30 mL  30 mL Oral Q4H PRN [name], [name], MD      . clonazePAM Scarlette Calico(KLONOPIN) tablet 0.5 mg  0.5 mg Oral BID PRN Darliss RidgelKapur, Aarti K, MD   0.5 mg at 05/29/18 1510  . cloZAPine (CLOZARIL) tablet 400 mg  400 mg Oral QHS Pucilowska, [name], MD      . docusate sodium (COLACE) capsule 100 mg  100 mg Oral Daily Pucilowska, [name], MD   100 mg at 05/29/18 0900  . feeding supplement (ENSURE ENLIVE) (ENSURE ENLIVE) liquid 237 mL  237 mL Oral TID BM Beverly SessionsSubedi, Cassondra Stachowski, MD   237 mL at 05/29/18 1500  .  gabapentin (NEURONTIN) capsule 300 mg  300 mg Oral TID Pucilowska, [name], MD   300 mg at 05/29/18 1156  . lithium carbonate capsule 750 mg  750 mg Oral BID WC Pucilowska, [name], MD   750 mg at 05/29/18 0900  . magnesium hydroxide (MILK OF MAGNESIA) suspension 30 mL  30 mL Oral Daily PRN [name], [name], MD      . nicotine (NICODERM CQ - dosed in mg/24 hours) patch 21 mg  21 mg Transdermal Daily [name], Jackquline DenmarkJohn T, MD   21 mg at 05/22/18 0834  . nicotine polacrilex (NICORETTE) gum 2 mg  2 mg Oral Q4H PRN Beverly SessionsSubedi, Maahir Horst, MD      . paliperidone (INVEGA SUSTENNA) injection 234 mg  234 mg Intramuscular Q28 days Pucilowska, [name], MD   234 mg at 05/12/18 1737    Lab Results:  No results found for this or any previous visit (from the past 48 hour(s)).  Blood Alcohol level:  Lab Results  Component Value Date   ETH <10 05/05/2018   ETH <10 10/12/2017    Metabolic Disorder Labs: Lab Results  Component Value Date   HGBA1C 5.1 05/05/2018   MPG 99.67 05/05/2018   Lab Results  Component Value Date   PROLACTIN 132.8 (H) 10/25/2017   Lab Results  Component Value Date   CHOL 190 05/07/2018   TRIG 112 05/07/2018   HDL 44 05/07/2018   CHOLHDL 4.3 05/07/2018   VLDL 22 05/07/2018   LDLCALC 124 (H) 05/07/2018   LDLCALC 72 10/25/2017    Physical Findings: AIMS: Facial and Oral Movements Muscles of Facial Expression: None, normal Lips and Perioral Area: None, normal Jaw: None, normal Tongue: None, normal,Extremity Movements Upper (arms, wrists, hands, fingers): None, normal Lower (legs, knees, ankles, toes): None, normal, Trunk Movements Neck, shoulders, hips: None, normal, Overall Severity Severity of abnormal movements (highest score from questions above): None, normal Incapacitation due to abnormal movements: None, normal Patient's awareness of abnormal movements (rate only patient's report): No Awareness, Dental Status Current problems with teeth and/or dentures?: No Does  patient usually wear dentures?: No  CIWA:    COWS:     Musculoskeletal: Strength & Muscle Tone: within normal limits Gait & Station: normal Patient leans: N/A  Psychiatric Specialty Exam: Physical Exam  Nursing note and vitals reviewed. Psychiatric: Her speech is normal. Her affect is blunt. She is withdrawn. Thought content is paranoid and delusional. Cognition and memory are impaired. She expresses impulsivity.    Review of Systems  Neurological: Negative.   Psychiatric/Behavioral: The patient is  nervous/anxious.   All other systems reviewed and are negative.   Blood pressure (!) 145/88, pulse (!) 125, temperature 98.6 F (37 C), temperature source Oral, resp. rate 20, height 5\' 2"  (1.575 m), weight 63.5 kg, SpO2 100 %.Body mass index is 25.61 kg/m.  General Appearance: Disheveled  Eye Contact:  Good  Speech:  Clear and Coherent  Volume:  Normal  Mood:  Euthymic  Affect:  restricted  Thought Process:  Irrelevant and Descriptions of Associations: Tangential  Orientation:  Full (Time, Place, and Person)  Thought Content:  Delusions and Paranoid Ideation  Suicidal Thoughts:  denies  Homicidal Thoughts:  No  Memory:  Immediate;   Fair Recent;   Fair Remote;   Fair  Judgement:  Poor  Insight:  Lacking  Psychomotor Activity:  Psychomotor Retardation  Concentration:  Concentration: Poor and Attention Span: Poor  Recall:  Poor  Fund of Knowledge:  Poor  Language:  Poor  Akathisia:  No  Handed:  Right  AIMS (if indicated):     Assets:  Communication Skills Desire for Improvement Financial Resources/Insurance Housing Physical Health Resilience Social Support  ADL's:  Intact  Cognition:  WNL  Sleep:  Number of Hours: 6.45     Treatment Plan Summary: Daily contact with patient to assess and evaluate symptoms and progress in treatment and Medication management   [name] is a 22 year old female with a history of schizoaffective disorder admitted for auditory  hallucinations and paranoia in the context of medication noncompliance. Pt still psychotic,  #Schizoaffective disorder, improvement -continueLithium 750 mg twice daily,Lithium level was 0.55 on 8/6, will recheck level -continuemonthly Invega sustenna 234 mg injections,next dose 8/28 - Clozapine  increased to 400 mg   Diet #Poor oral intake -Ensure TID  #Smoking cessation -nicotine patch is available,. And  gum   #Labs -lipid panel, TSH, A1Care normal -EKGreviewed, NSR with QTc of 420, rpt ekg for QTc -pregnancy test is negative  #Disposition- per primary team  Beverly SessionsJagannath Amil Bouwman, MD 05/29/2018, 4:16 PMPatient ID: Cristal GenerousSarah Belle Schwarzkopf, female   DOB: 06/14/1996, 22 y.o.   MRN: [id number] Patient ID: Cristal GenerousSarah Belle Minch, female   DOB: 02/23/1996, 22 y.o.   MRN: [id number]

## 2018-05-30 MED ORDER — METOPROLOL TARTRATE 25 MG PO TABS
12.5000 mg | ORAL_TABLET | Freq: Two times a day (BID) | ORAL | Status: DC
  Administered 2018-05-31 – 2018-06-01 (×3): 12.5 mg via ORAL
  Filled 2018-05-30 (×3): qty 1

## 2018-05-30 MED ORDER — NICOTINE POLACRILEX 2 MG MT GUM
2.0000 mg | CHEWING_GUM | OROMUCOSAL | Status: DC | PRN
  Administered 2018-06-02 – 2018-06-07 (×5): 2 mg via ORAL
  Filled 2018-05-30 (×5): qty 1

## 2018-05-30 NOTE — Progress Notes (Signed)
[facility] MD Progress Note  05/30/2018 5:40 PM Kimberly Boyle  MRN:  [id number]  Subjective:    [name] seems better today. She is more relaxed and engaging, she is out of her room and started participating in activities. She however, still hallucinates. It seems that it has been a long time since she was hallucination free. She takes all her medications and reports no side effects. Hyiene is better too.  Spoke with her mother who applied for guardianship and, possibly disability by now. She will come to a family meeting on Wednesday.  Principal Problem: Schizoaffective disorder, bipolar type (HCC) Diagnosis:   Patient Active Problem List   Diagnosis Date Noted  . Schizoaffective disorder, bipolar type (HCC) [F25.0] 05/06/2018    Priority: High  . Tobacco use disorder [F17.200] 05/08/2018  . Suicide attempt (HCC) [T14.91XA] 08/20/2017  . Cannabis use disorder, moderate, dependence (HCC) [F12.20] 08/20/2017  . Self-inflicted laceration of wrist [S61.519A] 08/20/2017  . Acetaminophen poisoning [T39.1X1A] 08/20/2017  . Generalized anxiety disorder [F41.1] 01/05/2017  . Mood disorder (HCC) [F39] 01/05/2017   Total Time spent with patient: 20 minutes  Past Psychiatric History: schizoprenia  Past Medical History:  Past Medical History:  Diagnosis Date  . Anxiety   . Arthritis   . Bipolar disorder (HCC)   . Depression   . Major depressive disorder   . Panic disorder   . Schizophrenia (HCC)    History reviewed. No pertinent surgical history. Family History:  Family History  Problem Relation Age of Onset  . Post-traumatic stress disorder Mother   . Depression Mother   . Depression Maternal Uncle   . Cancer Maternal Grandmother    Family Psychiatric  History: none Social History:  Social History   Substance and Sexual Activity  Alcohol Use No  . Frequency: Never     Social History   Substance and Sexual Activity  Drug Use No  . Types: Marijuana    Social History    Socioeconomic History  . Marital status: Single    Spouse name: Not on file  . Number of children: 0  . Years of education: Not on file  . Highest education level: High school graduate  Occupational History    Comment: unemployed  Social Needs  . Financial resource strain: Not on file  . Food insecurity:    Worry: Not on file    Inability: Not on file  . Transportation needs:    Medical: No    Non-medical: No  Tobacco Use  . Smoking status: Current Every Day Smoker    Packs/day: 0.25    Types: Cigarettes  . Smokeless tobacco: Never Used  Substance and Sexual Activity  . Alcohol use: No    Frequency: Never  . Drug use: No    Types: Marijuana  . Sexual activity: Not Currently  Lifestyle  . Physical activity:    Days per week: 0 days    Minutes per session: 0 min  . Stress: To some extent  Relationships  . Social connections:    Talks on phone: Not on file    Gets together: Not on file    Attends religious service: Never    Active member of club or organization: No    Attends meetings of clubs or organizations: Never    Relationship status: Never married  Other Topics Concern  . Not on file  Social History Narrative   ** Merged History Encounter **       Additional Social History:  Sleep: Fair  Appetite:  Fair  Current Medications: Current Facility-Administered Medications  Medication Dose Route Frequency Provider Last Rate Last Dose  . acetaminophen (TYLENOL) tablet 650 mg  650 mg Oral Q6H PRN [name], Jackquline DenmarkJohn T, MD   650 mg at 05/26/18 1815  . alum & mag hydroxide-simeth (MAALOX/MYLANTA) 200-200-20 MG/5ML suspension 30 mL  30 mL Oral Q4H PRN [name], [name], MD      . clonazePAM Scarlette Calico(KLONOPIN) tablet 0.5 mg  0.5 mg Oral BID PRN Darliss RidgelKapur, Aarti K, MD   0.5 mg at 05/30/18 1245  . cloZAPine (CLOZARIL) tablet 400 mg  400 mg Oral QHS Dylann Layne B, MD   400 mg at 05/29/18 2145  . docusate sodium (COLACE) capsule 100 mg  100 mg  Oral Daily Ronal Maybury B, MD   100 mg at 05/30/18 0922  . feeding supplement (ENSURE ENLIVE) (ENSURE ENLIVE) liquid 237 mL  237 mL Oral TID BM Beverly SessionsSubedi, Jagannath, MD   237 mL at 05/30/18 1646  . gabapentin (NEURONTIN) capsule 300 mg  300 mg Oral TID Guillermina Shaft B, MD   300 mg at 05/30/18 1645  . lithium carbonate capsule 750 mg  750 mg Oral BID WC Charlean Carneal B, MD   750 mg at 05/30/18 1645  . magnesium hydroxide (MILK OF MAGNESIA) suspension 30 mL  30 mL Oral Daily PRN [name], [name], MD      . nicotine (NICODERM CQ - dosed in mg/24 hours) patch 21 mg  21 mg Transdermal Daily [name], Jackquline DenmarkJohn T, MD   21 mg at 05/22/18 0834  . nicotine polacrilex (NICORETTE) gum 2 mg  2 mg Oral Q4H PRN Beverly SessionsSubedi, Jagannath, MD   2 mg at 05/30/18 1645  . paliperidone (INVEGA SUSTENNA) injection 234 mg  234 mg Intramuscular Q28 days Mame Twombly B, MD   234 mg at 05/12/18 1737    Lab Results: No results found for this or any previous visit (from the past 48 hour(s)).  Blood Alcohol level:  Lab Results  Component Value Date   ETH <10 05/05/2018   ETH <10 10/12/2017    Metabolic Disorder Labs: Lab Results  Component Value Date   HGBA1C 5.1 05/05/2018   MPG 99.67 05/05/2018   Lab Results  Component Value Date   PROLACTIN 132.8 (H) 10/25/2017   Lab Results  Component Value Date   CHOL 190 05/07/2018   TRIG 112 05/07/2018   HDL 44 05/07/2018   CHOLHDL 4.3 05/07/2018   VLDL 22 05/07/2018   LDLCALC 124 (H) 05/07/2018   LDLCALC 72 10/25/2017    Physical Findings: AIMS: Facial and Oral Movements Muscles of Facial Expression: None, normal Lips and Perioral Area: None, normal Jaw: None, normal Tongue: None, normal,Extremity Movements Upper (arms, wrists, hands, fingers): None, normal Lower (legs, knees, ankles, toes): None, normal, Trunk Movements Neck, shoulders, hips: None, normal, Overall Severity Severity of abnormal movements (highest score from questions above):  None, normal Incapacitation due to abnormal movements: None, normal Patient's awareness of abnormal movements (rate only patient's report): No Awareness, Dental Status Current problems with teeth and/or dentures?: No Does patient usually wear dentures?: No  CIWA:    COWS:     Musculoskeletal: Strength & Muscle Tone: within normal limits Gait & Station: normal Patient leans: N/A  Psychiatric Specialty Exam: Physical Exam  Nursing note and vitals reviewed. Psychiatric: Her speech is normal. Her affect is blunt. She is withdrawn and actively hallucinating. Thought content is paranoid and delusional. Cognition and memory are impaired.  She expresses impulsivity.    Review of Systems  Neurological: Negative.   Psychiatric/Behavioral: Positive for hallucinations.  All other systems reviewed and are negative.   Blood pressure 112/73, pulse 92, temperature 98.3 F (36.8 C), temperature source Oral, resp. rate 18, height 5\' 2"  (1.575 m), weight 63.5 kg, SpO2 98 %.Body mass index is 25.61 kg/m.  General Appearance: Casual  Eye Contact:  Good  Speech:  Clear and Coherent  Volume:  Normal  Mood:  Euthymic  Affect:  Flat  Thought Process:  Irrelevant and Descriptions of Associations: Loose  Orientation:  Full (Time, Place, and Person)  Thought Content:  Delusions, Hallucinations: Auditory and Paranoid Ideation  Suicidal Thoughts:  No  Homicidal Thoughts:  No  Memory:  Immediate;   Fair Recent;   Fair Remote;   Fair  Judgement:  Poor  Insight:  Lacking  Psychomotor Activity:  Decreased  Concentration:  Concentration: Fair and Attention Span: Fair  Recall:  [employer]  Fund of Knowledge:  Fair  Language:  Fair  Akathisia:  No  Handed:  Right  AIMS (if indicated):     Assets:  Communication Skills Desire for Improvement Financial Resources/Insurance Housing Physical Health Resilience Social Support  ADL's:  Intact  Cognition:  WNL  Sleep:  Number of Hours: 5     Treatment Plan  Summary: Daily contact with patient to assess and evaluate symptoms and progress in treatment and Medication management   Ms. Kimberly Boyle is a 22 year old female with a history of schizoaffective disorder admitted for auditory hallucinations and paranoia in the context of medication noncompliance. She is currently on two antipsychoticsin addition to a mood stabilizerwith some improvement.  #Schizoaffective disorder, improvement -continueLithium 750 mg twice daily,Lithium level was 0.55 on 8/6, will recheck level -continuemonthly Invega sustenna 234 mg injections,next dose 8/28 -continue Clozapine to400 mg nightly  #Anxiety -continueNeurontin 300 mg TID -decrease Clonazepam to 0.5 mg BID PRN   #Cannabis use disorder -She minimizes the use of marijuana and is not interested in any substance abuse treatment -She was advised to abstain from marijuana and all illicit drugs as they may worsen psychosis  Diet #Poor oral intake -Ensure TID  #Smoking cessation -nicotine patch is availabe  #Labs -lipid panel, TSH, [facility] normal -EKGreviewed, NSR with QTc of 420 -pregnancy test is negative  #Disposition -discharge to home with her parents -needs [facility] [place] -needs a guardian and disability -will ask for 90 days of outpatient commitment  Kristine LineaJolanta Harvest Stanco, MD 05/30/2018, 5:40 PM

## 2018-05-30 NOTE — [facility] Group Notes (Signed)
[facility] Group Notes:  (Nursing/MHT/Case Management/Adjunct)  Date:  05/30/2018  Time:  10:03 PM  Type of Therapy:  Group Therapy  Participation Level:  Did Not Attend   Mayra NeerJackie L Kaleisha Bhargava 05/30/2018, 10:03 PM

## 2018-05-30 NOTE — Progress Notes (Signed)
[facility] MD Progress Note  05/31/2018 11:31 AM Shaletta Hinostroza  MRN:  [id number]  Subjective:   Faithanne has no complaints. She is "well rested". She still talks to her voices but in provate now. Acceptes medications and tyolerated them well. No somatic complaints.  Discharge tomorrow after family meeting.  Principal Problem: Schizoaffective disorder, bipolar type (HCC) Diagnosis:   Patient Active Problem List   Diagnosis Date Noted  . Schizoaffective disorder, bipolar type (HCC) [F25.0] 05/06/2018    Priority: High  . Tobacco use disorder [F17.200] 05/08/2018  . Suicide attempt (HCC) [T14.91XA] 08/20/2017  . Cannabis use disorder, moderate, dependence (HCC) [F12.20] 08/20/2017  . Self-inflicted laceration of wrist [S61.519A] 08/20/2017  . Acetaminophen poisoning [T39.1X1A] 08/20/2017  . Generalized anxiety disorder [F41.1] 01/05/2017  . Mood disorder (HCC) [F39] 01/05/2017   Total Time spent with patient: 20 minutes  Past Psychiatric History: schizophrenia  Past Medical History:  Past Medical History:  Diagnosis Date  . Anxiety   . Arthritis   . Bipolar disorder (HCC)   . Depression   . Major depressive disorder   . Panic disorder   . Schizophrenia (HCC)    History reviewed. No pertinent surgical history. Family History:  Family History  Problem Relation Age of Onset  . Post-traumatic stress disorder Mother   . Depression Mother   . Depression Maternal Uncle   . Cancer Maternal Grandmother    Family Psychiatric  History: none Social History:  Social History   Substance and Sexual Activity  Alcohol Use No  . Frequency: Never     Social History   Substance and Sexual Activity  Drug Use No  . Types: Marijuana    Social History   Socioeconomic History  . Marital status: Single    Spouse name: Not on file  . Number of children: 0  . Years of education: Not on file  . Highest education level: High school graduate  Occupational History    Comment: unemployed   Social Needs  . Financial resource strain: Not on file  . Food insecurity:    Worry: Not on file    Inability: Not on file  . Transportation needs:    Medical: No    Non-medical: No  Tobacco Use  . Smoking status: Current Every Day Smoker    Packs/day: 0.25    Types: Cigarettes  . Smokeless tobacco: Never Used  Substance and Sexual Activity  . Alcohol use: No    Frequency: Never  . Drug use: No    Types: Marijuana  . Sexual activity: Not Currently  Lifestyle  . Physical activity:    Days per week: 0 days    Minutes per session: 0 min  . Stress: To some extent  Relationships  . Social connections:    Talks on phone: Not on file    Gets together: Not on file    Attends religious service: Never    Active member of club or organization: No    Attends meetings of clubs or organizations: Never    Relationship status: Never married  Other Topics Concern  . Not on file  Social History Narrative   ** Merged History Encounter **       Additional Social History:                         Sleep: Fair  Appetite:  Fair  Current Medications: Current Facility-Administered Medications  Medication Dose Route Frequency Provider Last Rate Last Dose  .  acetaminophen (TYLENOL) tablet 650 mg  650 mg Oral Q6H PRN [name], Jackquline DenmarkJohn T, MD   650 mg at 05/26/18 1815  . alum & mag hydroxide-simeth (MAALOX/MYLANTA) 200-200-20 MG/5ML suspension 30 mL  30 mL Oral Q4H PRN [name], [name], MD      . clonazePAM Scarlette Calico(KLONOPIN) tablet 0.5 mg  0.5 mg Oral BID PRN Darliss RidgelKapur, Aarti K, MD   0.5 mg at 05/30/18 2152  . cloZAPine (CLOZARIL) tablet 400 mg  400 mg Oral QHS Lovell Roe B, MD   400 mg at 05/30/18 2123  . docusate sodium (COLACE) capsule 100 mg  100 mg Oral Daily Chana Lindstrom B, MD   100 mg at 05/31/18 0758  . feeding supplement (ENSURE ENLIVE) (ENSURE ENLIVE) liquid 237 mL  237 mL Oral TID BM Beverly SessionsSubedi, Jagannath, MD   237 mL at 05/31/18 0915  . gabapentin (NEURONTIN) capsule 300 mg   300 mg Oral TID Tywon Niday B, MD   300 mg at 05/31/18 0758  . lithium carbonate capsule 750 mg  750 mg Oral BID WC Jaclin Finks B, MD   750 mg at 05/31/18 0758  . magnesium hydroxide (MILK OF MAGNESIA) suspension 30 mL  30 mL Oral Daily PRN [name], [name], MD      . metoprolol tartrate (LOPRESSOR) tablet 12.5 mg  12.5 mg Oral BID Zaniyah Wernette B, MD   12.5 mg at 05/31/18 0758  . nicotine polacrilex (NICORETTE) gum 2 mg  2 mg Oral PRN Jacie Tristan B, MD      . paliperidone (INVEGA SUSTENNA) injection 234 mg  234 mg Intramuscular Q28 days Thane Age B, MD   234 mg at 05/12/18 1737    Lab Results: No results found for this or any previous visit (from the past 48 hour(s)).  Blood Alcohol level:  Lab Results  Component Value Date   ETH <10 05/05/2018   ETH <10 10/12/2017    Metabolic Disorder Labs: Lab Results  Component Value Date   HGBA1C 5.1 05/05/2018   MPG 99.67 05/05/2018   Lab Results  Component Value Date   PROLACTIN 132.8 (H) 10/25/2017   Lab Results  Component Value Date   CHOL 190 05/07/2018   TRIG 112 05/07/2018   HDL 44 05/07/2018   CHOLHDL 4.3 05/07/2018   VLDL 22 05/07/2018   LDLCALC 124 (H) 05/07/2018   LDLCALC 72 10/25/2017    Physical Findings: AIMS: Facial and Oral Movements Muscles of Facial Expression: None, normal Lips and Perioral Area: None, normal Jaw: None, normal Tongue: None, normal,Extremity Movements Upper (arms, wrists, hands, fingers): None, normal Lower (legs, knees, ankles, toes): None, normal, Trunk Movements Neck, shoulders, hips: None, normal, Overall Severity Severity of abnormal movements (highest score from questions above): None, normal Incapacitation due to abnormal movements: None, normal Patient's awareness of abnormal movements (rate only patient's report): No Awareness, Dental Status Current problems with teeth and/or dentures?: No Does patient usually wear dentures?: No  CIWA:     COWS:     Musculoskeletal: Strength & Muscle Tone: within normal limits Gait & Station: normal Patient leans: N/A  Psychiatric Specialty Exam: Physical Exam  Nursing note and vitals reviewed. Psychiatric: Her speech is normal. Her affect is blunt. She is withdrawn and actively hallucinating. Thought content is paranoid and delusional. Cognition and memory are impaired. She expresses impulsivity.    Review of Systems  Neurological: Negative.   Psychiatric/Behavioral: Positive for hallucinations.  All other systems reviewed and are negative.   Blood pressure 122/86, pulse  97, temperature 97.8 F (36.6 C), temperature source Oral, resp. rate 16, height 5\' 2"  (1.575 m), weight 63.5 kg, SpO2 100 %.Body mass index is 25.61 kg/m.  General Appearance: Casual  Eye Contact:  Good  Speech:  Clear and Coherent  Volume:  Normal  Mood:  Euthymic  Affect:  Flat  Thought Process:  Goal Directed and Descriptions of Associations: Intact  Orientation:  Full (Time, Place, and Person)  Thought Content:  Delusions, Hallucinations: Auditory and Paranoid Ideation  Suicidal Thoughts:  No  Homicidal Thoughts:  No  Memory:  Immediate;   Fair Recent;   Fair Remote;   Fair  Judgement:  Poor  Insight:  Lacking  Psychomotor Activity:  Decreased  Concentration:  Concentration: Fair and Attention Span: Fair  Recall:  [employer]Fair  Fund of Knowledge:  Fair  Language:  Fair  Akathisia:  No  Handed:  Right  AIMS (if indicated):     Assets:  Communication Skills Desire for Improvement Financial Resources/Insurance Housing Physical Health Resilience Social Support  ADL's:  Intact  Cognition:  WNL  Sleep:  Number of Hours: 6     Treatment Plan Summary: Daily contact with patient to assess and evaluate symptoms and progress in treatment and Medication management   [name] is a 22 year old female with a history of schizoaffective disorder admitted for auditory hallucinations and paranoia in the  context of medication noncompliance. She is currently on two antipsychoticsin addition to a mood stabilizerwithmuch improvement.  #Schizoaffective disorder, improved -she was asleep for labs this morning -continueLithium 750 mg twice daily,Lithium level was 0.55 on 8/6, will recheck level -continuemonthly Invega sustenna 234 mg injections,next dose 8/28 -continue Clozapine to400 mg nightly  #Anxiety -continueNeurontin 300 mg TID -decrease Clonazepam to 0.5 mg BID PRN   #Tachycardia -Metoprolol 12.5 mg BID  #Cannabis use disorder -She minimizes the use of marijuana and is not interested in any substance abuse treatment -She was advised to abstain from marijuana and all illicit drugs as they may worsen psychosis  #Poor oral intake -Ensure TID  #Smoking cessation -nicorette gum is available  #Labs -lipid panel, TSH, [facility] normal -EKGreviewed, NSR with QTc of 420 -pregnancy test is negative  #Disposition -discharge to home with her parents -needs [facility] [place] private [place] -needs a guardian and disability -will ask for 90 days of outpatient commitment  Kristine LineaJolanta Baleria Wyman, MD 05/31/2018, 11:31 AM

## 2018-05-30 NOTE — Progress Notes (Signed)
Recreation Therapy Notes  Date: 05/30/2018  Time: 9:30 am   Location: Craft Room   Behavioral response: N/A   Intervention Topic: Stress  Discussion/Intervention: Patient did not attend group.   Clinical Observations/Feedback:  Patient did not attend group.   Kimberly Boyle LRT/CTRS        Latacha Texeira 05/30/2018 11:45 AM

## 2018-05-30 NOTE — Progress Notes (Signed)
Received Kimberly Boyle  this AM in her room, she was awaken for her medications. Her medications were brought  to her room. She slept through breakfast, but was OOB for lunch. Her hygiene remains poor. She was awake at briefs intervals this PM. She refused MOM after stating her last BM was 3-4 days ago.

## 2018-05-31 LAB — CBC WITH DIFFERENTIAL/PLATELET
Basophils Absolute: 0 10*3/uL (ref 0–0.1)
Basophils Relative: 0 %
EOS ABS: 0.3 10*3/uL (ref 0–0.7)
EOS PCT: 3 %
HCT: 39.1 % (ref 35.0–47.0)
HEMOGLOBIN: 13 g/dL (ref 12.0–16.0)
LYMPHS ABS: 1.6 10*3/uL (ref 1.0–3.6)
Lymphocytes Relative: 15 %
MCH: 30.9 pg (ref 26.0–34.0)
MCHC: 33.2 g/dL (ref 32.0–36.0)
MCV: 93.1 fL (ref 80.0–100.0)
MONOS PCT: 10 %
Monocytes Absolute: 1.1 10*3/uL — ABNORMAL HIGH (ref 0.2–0.9)
Neutro Abs: 8.1 10*3/uL — ABNORMAL HIGH (ref 1.4–6.5)
Neutrophils Relative %: 72 %
PLATELETS: 338 10*3/uL (ref 150–440)
RBC: 4.2 MIL/uL (ref 3.80–5.20)
RDW: 12.9 % (ref 11.5–14.5)
WBC: 11.2 10*3/uL — ABNORMAL HIGH (ref 3.6–11.0)

## 2018-05-31 LAB — BASIC METABOLIC PANEL
Anion gap: 6 (ref 5–15)
BUN: 14 mg/dL (ref 6–20)
CO2: 28 mmol/L (ref 22–32)
Calcium: 9.1 mg/dL (ref 8.9–10.3)
Chloride: 106 mmol/L (ref 98–111)
Creatinine, Ser: 0.57 mg/dL (ref 0.44–1.00)
GFR calc Af Amer: >60 mL/min (ref >60)
GLUCOSE: 87 mg/dL (ref 70–99)
POTASSIUM: 3.8 mmol/L (ref 3.5–5.1)
Sodium: 140 mmol/L (ref 135–145)

## 2018-05-31 LAB — LITHIUM LEVEL: LITHIUM LVL: 1.09 mmol/L (ref 0.60–1.20)

## 2018-05-31 MED ORDER — METFORMIN HCL 500 MG PO TABS
500.0000 mg | ORAL_TABLET | Freq: Two times a day (BID) | ORAL | Status: DC
  Administered 2018-06-01 – 2018-06-08 (×15): 500 mg via ORAL
  Filled 2018-05-31 (×16): qty 1

## 2018-05-31 MED ORDER — CLOZAPINE 200 MG PO TABS: 400.0000 mg | ORAL_TABLET | Freq: Every day | ORAL | 1 refills | Status: DC

## 2018-05-31 MED ORDER — PALIPERIDONE PALMITATE ER 234 MG/1.5ML IM SUSY: 234.0000 mg | PREFILLED_SYRINGE | INTRAMUSCULAR | 1 refills | Status: DC

## 2018-05-31 MED ORDER — METOPROLOL TARTRATE 25 MG PO TABS: 12.5000 mg | ORAL_TABLET | Freq: Two times a day (BID) | ORAL | 1 refills | Status: DC

## 2018-05-31 MED ORDER — CLONAZEPAM 0.5 MG PO TABS: 0.5000 mg | ORAL_TABLET | Freq: Two times a day (BID) | ORAL | 0 refills | Status: DC | PRN

## 2018-05-31 MED ORDER — LITHIUM CARBONATE 150 MG PO CAPS: 750.0000 mg | ORAL_CAPSULE | Freq: Two times a day (BID) | ORAL | 1 refills | Status: DC

## 2018-05-31 NOTE — [facility] Group Notes (Signed)
05/31/2018 1PM  Type of Therapy/Topic:  Group Therapy:  Feelings about Diagnosis  Participation Level:  Did Not Attend   Description of Group:   This group will allow patients to explore their thoughts and feelings about diagnoses they have received. Patients will be guided to explore their level of understanding and acceptance of these diagnoses. Facilitator will encourage patients to process their thoughts and feelings about the reactions of others to their diagnosis and will guide patients in identifying ways to discuss their diagnosis with significant others in their lives. This group will be process-oriented, with patients participating in exploration of their own experiences, giving and receiving support, and processing challenge from other group members.   Therapeutic Goals: 1. Patient will demonstrate understanding of diagnosis as evidenced by identifying two or more symptoms of the disorder 2. Patient will be able to express two feelings regarding the diagnosis 3. Patient will demonstrate their ability to communicate their needs through discussion and/or role play  Summary of Patient Progress: Patient was encouraged and invited to attend group. Patient did not attend group. Social worker will continue to encourage group participation in the future.        Therapeutic Modalities:   Cognitive Behavioral Therapy Brief Therapy Feelings Identification    Suan Pyeatt, LCSW 05/31/2018 2:32 PM  

## 2018-05-31 NOTE — Progress Notes (Signed)
Clozapine monitoring Consult   22 yo female ordered clozapine  05/31/18  [id number]  Information entered into Clozapine registry. This patient is scheduled for discharge 8/21 am. Next labs due in a week - ordered for 06/07/2018  Pharmacy will continue to follow.

## 2018-05-31 NOTE — Progress Notes (Signed)
Patient refused morning Labs. Patient was offered multiple time and remarks "five more minutes" multiple times without ever getting out of bed. Patient was cooperative with medication and assessment.

## 2018-05-31 NOTE — Plan of Care (Signed)
Pleasant on approach, less needy, less demanding, less entitled, cleaned up her room and got rid of several cup of water everywhere in her room; towel, wash clothes given and she showered, changed into personal clothing, received Clonazepam 0.5 mg for anxiety  Patient slept for Estimated Hours of 6.0; Precautionary checks every 15 minutes for safety maintained, room free of safety hazards, patient sustains no injury or falls during this shift.  Problem: Education: Goal: Mental status will improve Outcome: Progressing   Problem: Coping: Goal: Coping ability will improve Outcome: Progressing Goal: Will verbalize feelings Outcome: Progressing   Problem: Safety: Goal: Ability to remain free from injury will improve Outcome: Progressing   Problem: Self-Concept: Goal: Will verbalize positive feelings about self Outcome: Progressing   Problem: Activity: Goal: Interest or engagement in leisure activities will improve Outcome: Progressing   Problem: Coping: Goal: Will verbalize feelings Outcome: Progressing   Problem: Health Behavior/Discharge Planning: Goal: Ability to make decisions will improve Outcome: Progressing Goal: Compliance with therapeutic regimen will improve Outcome: Progressing

## 2018-05-31 NOTE — Progress Notes (Signed)
Recreation Therapy Notes   Date: 05/31/2018  Time: 9:30 am   Location: Craft Room   Behavioral response: N/A   Intervention Topic: Self-Care  Discussion/Intervention: Patient did not attend group.   Clinical Observations/Feedback:  Patient did not attend group.   Tayli Buch LRT/CTRS        Ysmael Hires 05/31/2018 10:48 AM

## 2018-05-31 NOTE — Plan of Care (Signed)
D: Patient isolates to room and has minimal interaction this shift. Patient has complaints of minor anxiety and requests PRN medication for relief. Patient denies SI/HI/AVH. Patient has multiple requests for ensure. Patient is instructed to clean her room and shower this shift.  A: Patient is provided with PRN and scheduled medications. Patient's safety is maintained this shift. R: Patient is minimal with staff and isolates to room. Patient has no complaints of pain. Patient is compliant with requests for shower, room cleaning and EKG today.    Problem: Safety: Goal: Ability to remain free from injury will improve Outcome: Progressing   Problem: Education: Goal: Knowledge of [facility] Education information/materials will improve Outcome: Not Progressing Goal: Mental status will improve Outcome: Not Progressing   Problem: Coping: Goal: Coping ability will improve Outcome: Not Progressing Goal: Will verbalize feelings Outcome: Not Progressing   Problem: Activity: Goal: Imbalance in normal sleep/wake cycle will improve Outcome: Not Progressing
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
## 2018-05-31 NOTE — Progress Notes (Signed)
Patient ID: Kimberly Boyle, female   DOB: 07/16/1996, 22 y.o.   MRN: [id number] CSW sent referral to [name] and [name].  [facility] heard [facility] from [name], but spoke with [name] with [name] who says she will plan to come tomorrow at 11am to meet with Kimberly Boyle to complete paperwork for services. Pt's mother was planning to come and meet with Dr. Demetrius CharityP at [facility], but since [facility]T team would be coming after it was agreed to move this meeting to 1pm after Pt has had assessment so that services can be in place at discharge.  Kimberly SharkSara Kiara Mcdowell, LCSW

## 2018-05-31 NOTE — [facility] Suicide Risk Assessment (Addendum)
[facility]   Principal Problem: Schizoaffective disorder, bipolar type [facility](HCC) Discharge Diagnoses:  Patient Active Problem List   Diagnosis Date Noted  . Schizoaffective disorder, bipolar type (HCC) [F25.0] 05/06/2018    Priority: High  . Tobacco use disorder [F17.200] 05/08/2018  . Suicide attempt (HCC) [T14.91XA] 08/20/2017  . Cannabis use disorder, moderate, dependence (HCC) [F12.20] 08/20/2017  . Self-inflicted laceration of wrist [S61.519A] 08/20/2017  . Acetaminophen poisoning [T39.1X1A] 08/20/2017  . Generalized anxiety disorder [F41.1] 01/05/2017  . Mood disorder (HCC) [F39] 01/05/2017    Total Time spent with patient: 20 minutes  Musculoskeletal: Strength & Muscle Tone: within normal limits Gait & Station: normal Patient leans: N/A  Psychiatric Specialty Exam: Review of Systems  Neurological: Negative.   Psychiatric/Behavioral: Positive for hallucinations and substance abuse.  All other systems reviewed and are negative.   Blood pressure (!) 131/91, pulse (!) 111, temperature 97.6 F (36.4 C), temperature source Oral, resp. rate 16, height 5\' 2"  (1.575 m), weight 63.5 kg, SpO2 99 %.Body mass index is 25.61 kg/m.  General Appearance: Fairly Groomed  Patent attorneyye Contact::  Good  Speech:  Clear and Coherent409  Volume:  Normal  Mood:  Euthymic  Affect:  Flat  Thought Process:  Goal Directed, Irrelevant and Descriptions of Associations: Tangential  Orientation:  Full (Time, Place, and Person)  Thought Content:  Hallucinations: Auditory and Paranoid Ideation  Suicidal Thoughts:  No  Homicidal Thoughts:  No  Memory:  Immediate;   Fair Recent;   Fair Remote;   Fair  Judgement:  Impaired  Insight:  Lacking  Psychomotor Activity:  Decreased  Concentration:  Fair  Recall:  FiservFair  Fund of Knowledge:Fair  Language: Fair  Akathisia:  No  Handed:  Right  AIMS (if indicated):     Assets:  Communication Skills Desire for Improvement Financial  Resources/Insurance Housing Physical Health Resilience Social Support  Sleep:  Number of Hours: 8.15  Cognition: WNL  ADL's:  Intact   Mental Status Per Nursing Assessment::   On Admission:  NA  Demographic Factors:  Adolescent or young adult, Caucasian and Unemployed  Loss Factors: NA  Historical Factors: Prior suicide attempts and Impulsivity  Risk Reduction Factors:   Sense of responsibility to family, Living with another person, especially a relative and Positive social support  Continued Clinical Symptoms:  Alcohol/Substance Abuse/Dependencies Schizophrenia:   Less than 24 years old Paranoid or undifferentiated type  Cognitive Features That Contribute To Risk:  None    Suicide Risk:  Minimal: No identifiable suicidal ideation.  Patients presenting with no risk factors but with morbid ruminations; may be classified as minimal risk based on the severity of the depressive symptoms  Follow-up Information    Services, Psychotherapeutic Follow up.   Why:  The ACTT will be coming to visit with you within the week.  Contact information: [address] [place] [address] [phone]           Plan Of Care/Follow-up recommendations:  Activity:  as tolerated Diet:  regular Other:  keep follow up appointments  Kristine LineaJolanta Matraca Hunkins, MD 06/08/2018, 9:32 AM

## 2018-06-01 LAB — CLOZAPINE (CLOZARIL)
Clozapine Lvl: 451 ng/mL (ref 350–650)
NorClozapine: 206 ng/mL
TOTAL(CLOZ+ NORCLOZ): 657 ng/mL

## 2018-06-01 MED ORDER — CLOZAPINE 200 MG PO TABS: 400.0000 mg | ORAL_TABLET | Freq: Every day | ORAL | 1 refills | Status: DC

## 2018-06-01 MED ORDER — DIPHENHYDRAMINE HCL 50 MG/ML IJ SOLN
INTRAMUSCULAR | Status: AC
  Filled 2018-06-01: qty 1

## 2018-06-01 MED ORDER — METOPROLOL TARTRATE 25 MG PO TABS: 25.0000 mg | ORAL_TABLET | Freq: Two times a day (BID) | ORAL | 1 refills | Status: DC

## 2018-06-01 MED ORDER — DIPHENHYDRAMINE HCL 50 MG/ML IJ SOLN
50.0000 mg | Freq: Four times a day (QID) | INTRAMUSCULAR | Status: DC | PRN
  Administered 2018-06-01: 50 mg via INTRAMUSCULAR
  Filled 2018-06-01: qty 1

## 2018-06-01 MED ORDER — HALOPERIDOL LACTATE 5 MG/ML IJ SOLN
10.0000 mg | Freq: Four times a day (QID) | INTRAMUSCULAR | Status: DC | PRN
  Administered 2018-06-01: 10 mg via INTRAMUSCULAR
  Filled 2018-06-01 (×2): qty 2

## 2018-06-01 MED ORDER — CLOZAPINE 100 MG PO TBDP: 400.0000 mg | ORAL_TABLET | Freq: Every day | ORAL | 1 refills | Status: DC

## 2018-06-01 MED ORDER — METFORMIN HCL 500 MG PO TABS: 500.0000 mg | ORAL_TABLET | Freq: Two times a day (BID) | ORAL | Status: DC

## 2018-06-01 MED ORDER — DIPHENHYDRAMINE HCL 25 MG PO CAPS
50.0000 mg | ORAL_CAPSULE | Freq: Every evening | ORAL | Status: DC | PRN
  Administered 2018-06-01: 50 mg via ORAL
  Filled 2018-06-01: qty 2

## 2018-06-01 MED ORDER — LORAZEPAM 2 MG PO TABS
2.0000 mg | ORAL_TABLET | Freq: Four times a day (QID) | ORAL | Status: DC | PRN
  Administered 2018-06-01 – 2018-06-05 (×3): 2 mg via ORAL
  Filled 2018-06-01 (×3): qty 1

## 2018-06-01 MED ORDER — LORAZEPAM 2 MG/ML IJ SOLN
2.0000 mg | Freq: Four times a day (QID) | INTRAMUSCULAR | Status: DC | PRN
  Administered 2018-06-01: 2 mg via INTRAMUSCULAR
  Filled 2018-06-01: qty 1

## 2018-06-01 MED ORDER — METOPROLOL TARTRATE 25 MG PO TABS
25.0000 mg | ORAL_TABLET | Freq: Two times a day (BID) | ORAL | Status: DC
  Administered 2018-06-01 – 2018-06-08 (×14): 25 mg via ORAL
  Filled 2018-06-01 (×16): qty 1

## 2018-06-01 MED ORDER — HALOPERIDOL 5 MG PO TABS
10.0000 mg | ORAL_TABLET | Freq: Four times a day (QID) | ORAL | Status: DC | PRN
  Administered 2018-06-01: 10 mg via ORAL
  Filled 2018-06-01: qty 2

## 2018-06-01 NOTE — Tx Team (Signed)
Interdisciplinary Treatment and Diagnostic Plan Update  06/01/2018 Time of Session: 10:50am Cristal GenerousSarah Belle Knobloch MRN: [id number]  Principal Diagnosis: Schizoaffective disorder, bipolar type [facility](HCC)  Secondary Diagnoses: Principal Problem:   Schizoaffective disorder, bipolar type (HCC) Active Problems:   Cannabis use disorder, moderate, dependence (HCC)   Tobacco use disorder   Current Medications:  Current Facility-Administered Medications  Medication Dose Route Frequency Provider Last Rate Last Dose  . acetaminophen (TYLENOL) tablet 650 mg  650 mg Oral Q6H PRN [name], Jackquline DenmarkJohn T, MD   650 mg at 05/26/18 1815  . alum & mag hydroxide-simeth (MAALOX/MYLANTA) 200-200-20 MG/5ML suspension 30 mL  30 mL Oral Q4H PRN [name], [name], MD      . clonazePAM Scarlette Calico(KLONOPIN) tablet 0.5 mg  0.5 mg Oral BID PRN Darliss RidgelKapur, Aarti K, MD   0.5 mg at 06/01/18 1712  . cloZAPine (CLOZARIL) tablet 400 mg  400 mg Oral QHS Pucilowska, [name], MD   400 mg at 05/31/18 2111  . diphenhydrAMINE (BENADRYL) 50 MG/ML injection           . diphenhydrAMINE (BENADRYL) capsule 50 mg  50 mg Oral QHS PRN Pucilowska, [name], MD   50 mg at 06/01/18 1456   Or  . diphenhydrAMINE (BENADRYL) injection 50 mg  50 mg Intramuscular Q6H PRN Pucilowska, [name], MD      . docusate sodium (COLACE) capsule 100 mg  100 mg Oral Daily Pucilowska, [name], MD   100 mg at 06/01/18 0746  . feeding supplement (ENSURE ENLIVE) (ENSURE ENLIVE) liquid 237 mL  237 mL Oral TID BM Beverly SessionsSubedi, Jagannath, MD   237 mL at 06/01/18 1457  . gabapentin (NEURONTIN) capsule 300 mg  300 mg Oral TID Pucilowska, [name], MD   300 mg at 06/01/18 1711  . haloperidol (HALDOL) tablet 10 mg  10 mg Oral Q6H PRN Pucilowska, [name], MD   10 mg at 06/01/18 1456   Or  . haloperidol lactate (HALDOL) injection 10 mg  10 mg Intramuscular Q6H PRN Pucilowska, [name], MD      . lithium carbonate capsule 750 mg  750 mg Oral BID WC Pucilowska, [name], MD   750 mg at 06/01/18 1711   . LORazepam (ATIVAN) tablet 2 mg  2 mg Oral Q6H PRN Pucilowska, [name], MD   2 mg at 06/01/18 1456   Or  . LORazepam (ATIVAN) injection 2 mg  2 mg Intramuscular Q6H PRN Pucilowska, [name], MD      . magnesium hydroxide (MILK OF MAGNESIA) suspension 30 mL  30 mL Oral Daily PRN [name], [name], MD      . metFORMIN (GLUCOPHAGE) tablet 500 mg  500 mg Oral BID WC Pucilowska, [name], MD   500 mg at 06/01/18 1711  . metoprolol tartrate (LOPRESSOR) tablet 25 mg  25 mg Oral BID Pucilowska, [name], MD   25 mg at 06/01/18 1711  . nicotine polacrilex (NICORETTE) gum 2 mg  2 mg Oral PRN Pucilowska, [name], MD      . paliperidone (INVEGA SUSTENNA) injection 234 mg  234 mg Intramuscular Q28 days Pucilowska, [name], MD   234 mg at 05/12/18 1737   PTA Medications: Medications Prior to Admission  Medication Sig Dispense Refill Last Dose  . benztropine (COGENTIN) 0.5 MG tablet Take 1 tablet (0.5 mg total) by mouth 2 (two) times daily. 60 tablet 1 unknown at unknown  . fluPHENAZine (PROLIXIN) 10 MG tablet Take 20 mg by mouth at bedtime.  0 unknown at unknown  . fluPHENAZine (PROLIXIN) 5 MG tablet Take 5 mg by mouth 2 (two) times daily.  0 unknown at unknown  . hydrOXYzine (ATARAX/VISTARIL) 25 MG tablet Take 1 tablet (25 mg total) by mouth every 8 (eight) hours as needed for anxiety. (Patient not taking: Reported on 05/05/2018) 90 tablet 1 Not Taking at Unknown time  . lithium carbonate (ESKALITH) 450 MG CR tablet Take 1 tablet (450 mg total) by mouth 2 (two) times daily. 60 tablet 1 unknown at unknown  . risperiDONE (RISPERDAL) 2 MG tablet Take 0.5 tablets (1 mg total) by mouth daily. 15 tablet 1 unknown at unknown  . risperiDONE microspheres (RISPERDAL CONSTA) 25 MG injection Inject 2 mLs (25 mg total) into the muscle every 14 (fourteen) days. First dose 12/17/2017 (Patient not taking: Reported on 05/05/2018) 1 each 1 Not Taking  . VIENVA 0.1-20 MG-MCG tablet TK 1 T PO DAILY  3 unknown at unknown     Patient Stressors: Marital or family conflict Substance abuse Other:   Patient Strengths: Barrister's clerk for treatment/growth Supportive family/friends  Treatment Modalities: Medication Management, Group therapy, Case management,  1 to 1 session with clinician, Psychoeducation, Recreational therapy.   Physician Treatment Plan for Primary Diagnosis: Schizoaffective disorder, bipolar type (HCC) Long Term Goal(s): Improvement in symptoms so as ready for discharge Improvement in symptoms so as ready for discharge   Short Term Goals: Ability to identify changes in lifestyle to reduce recurrence of condition will improve Ability to identify changes in lifestyle to reduce recurrence of condition will improve  Medication Management: Evaluate patient's response, side effects, and tolerance of medication regimen.  Therapeutic Interventions: 1 to 1 sessions, Unit Group sessions and Medication administration.  Evaluation of Outcomes: Progressing  Physician Treatment Plan for Secondary Diagnosis: Principal Problem:   Schizoaffective disorder, bipolar type (HCC) Active Problems:   Cannabis use disorder, moderate, dependence (HCC)   Tobacco use disorder  Long Term Goal(s): Improvement in symptoms so as ready for discharge Improvement in symptoms so as ready for discharge   Short Term Goals: Ability to identify changes in lifestyle to reduce recurrence of condition will improve Ability to identify changes in lifestyle to reduce recurrence of condition will improve     Medication Management: Evaluate patient's response, side effects, and tolerance of medication regimen.  Therapeutic Interventions: 1 to 1 sessions, Unit Group sessions and Medication administration.  Evaluation of Outcomes: Progressing   RN Treatment Plan for Primary Diagnosis: Schizoaffective disorder, bipolar type (HCC) Long Term Goal(s): Knowledge of disease and therapeutic regimen to maintain  health will improve  Short Term Goals: Ability to identify and develop effective coping behaviors will improve and Compliance with prescribed medications will improve  Medication Management: RN will administer medications as ordered by provider, will assess and evaluate patient's response and provide education to patient for prescribed medication. RN will report any adverse and/or side effects to prescribing provider.  Therapeutic Interventions: 1 on 1 counseling sessions, Psychoeducation, Medication administration, Evaluate responses to treatment, Monitor vital signs and CBGs as ordered, Perform/monitor CIWA, COWS, AIMS and Fall Risk screenings as ordered, Perform wound care treatments as ordered.  Evaluation of Outcomes: Progressing   LCSW Treatment Plan for Primary Diagnosis: Schizoaffective disorder, bipolar type (HCC) Long Term Goal(s): Safe transition to appropriate next level of care at discharge, Engage patient in therapeutic group addressing interpersonal concerns.  Short Term Goals: Engage patient in aftercare planning with referrals and resources, Identify triggers associated with mental health/substance abuse issues and  Increase skills for wellness and recovery  Therapeutic Interventions: Assess for all discharge needs, 1 to 1 time with Social worker, Explore available resources and support systems, Assess for adequacy in community support network, Educate family and significant other(s) on suicide prevention, Complete Psychosocial Assessment, Interpersonal group therapy.  Evaluation of Outcomes: Progressing   Progress in Treatment: Attending groups: No. Participating in groups: No. Taking medication as prescribed: Yes. Toleration medication: Yes. Family/Significant other contact made: Yes, individual(s) contacted:  Pt's mother, Butler DenmarkDebbie Randazzo, has been contacted. Patient understands diagnosis: Yes. Discussing patient identified problems/goals with staff: Yes. Medical problems  stabilized or resolved: No. Denies suicidal/homicidal ideation: No. Issues/concerns per patient self-inventory: No. Other:    New problem(s) identified: No, Describe:     New Short Term/Long Term Goal(s):  Patient Goals:  To feel safe.   Discharge Plan or Barriers: Although pt continues to struggle with hallucinations, she is at her baseline. Discharge plan now if for pt to participate in a residential program.  Due to her past aggressive behaviors towards her mother, her father does not want her to return to the home.  Pt's mother is also pursuing guardianship.  Pt has been accepting for ACT services with [name].  Reason for Continuation of Hospitalization: Hallucinations Other; describe Need for further discharge planning  Estimated Length of Stay: 5-7 days    Attendees: Patient: 06/01/2018 6:08 PM  Physician: Kristine LineaJolanta Pucilowska, MD 06/01/2018 6:08 PM  Nursing: Hulan AmatoGwen Farrish, RN 06/01/2018 6:08 PM  RN Care Manager: 06/01/2018 6:08 PM  Social Worker: Huey RomansSonya Jessicaann Overbaugh, LCSW 06/01/2018 6:08 PM  Recreational Therapist:  06/01/2018 6:08 PM  Other: Johny Shearsassandra Jarrett, LCSWA 06/01/2018 6:08 PM  Other:  06/01/2018 6:08 PM  Other: 06/01/2018 6:08 PM    Scribe for Treatment Team: Alease FrameSonya S Oliviarose Punch, LCSW 06/01/2018 6:08 PM

## 2018-06-01 NOTE — Progress Notes (Signed)
Received Kiyanna this AM, she is looking forward to going home today. Her mother arrived for the meeting with the doctor, pt and this Clinical research associatewriter. Her mother stated her husband is apprehensive about [name] coming home and fear if it does not work out, Doctor, general practicearah and mom will be homeless.The discharge order was cancelled. Later Huntley DecSara was medicated per order with a B52 PO. She reported only sleeping for 30 minutes. She is calm.

## 2018-06-01 NOTE — [facility] Group Notes (Signed)
[facility] Group Notes:  (Nursing/MHT/Case Management/Adjunct)  Date:  06/01/2018  Time:  9:40 PM  Type of Therapy:  Group Therapy  Participation Level:  Active  Participation Quality:  Sharing  Affect:  Irritable  Cognitive:  Alert  Insight:  Limited  Engagement in Group:  Engaged  Modes of Intervention:  Discussion  Summary of Progress/Problems: Kimberly Boyle stated her goal was to leave on this day. Kimberly Boyle was not able to leave. Kimberly Boyle stated she was just going to make this place her home since they gave out food and snacks. "I'm just going to call this place home." Kimberly Boyle has no plans for housing at this time.  Jinger NeighborsKeith D Darletta Noblett 06/01/2018, 9:40 PM

## 2018-06-01 NOTE — [facility] Group Notes (Signed)
LCSW Group Therapy Note  06/01/2018 1:00pm  Type of Therapy/Topic:  Group Therapy:  Emotion Regulation  Participation Level:  Did Not Attend   Description of Group:    The purpose of this group is to assist patients in learning to regulate negative emotions and experience positive emotions. Patients will be guided to discuss ways in which they have been vulnerable to their negative emotions. These vulnerabilities will be juxtaposed with experiences of positive emotions or situations, and patients will be challenged to use positive emotions to combat negative ones. Special emphasis will be placed on coping with negative emotions in conflict situations, and patients will process healthy conflict resolution skills.  Therapeutic Goals: 1. Patient will identify two positive emotions or experiences to reflect on in order to balance out negative emotions 2. Patient will label two or more emotions that they find the most difficult to experience 3. Patient will demonstrate positive conflict resolution skills through discussion and/or role plays  Summary of Patient Progress: Kimberly Boyle was invited to today's group, but chose not to attend.      Therapeutic Modalities:   Cognitive Behavioral Therapy Feelings Identification Dialectical Behavioral Therapy

## 2018-06-01 NOTE — Plan of Care (Signed)
Visible, Ensure given as ordered, restless, showered x2 during this shift and changed clothes many times; still entitled, Clonazepam 0.5 mg PO PRN given with bedtime medications. Denied SI/HI/AVH.   Patient slept for Estimated Hours of 4.0; Precautionary checks every 15 minutes for safety maintained, room free of safety hazards, patient sustains no injury or falls during this shift.  Problem: Education: Goal: Mental status will improve Outcome: Progressing   Problem: Coping: Goal: Coping ability will improve Outcome: Progressing Goal: Will verbalize feelings Outcome: Progressing   Problem: Safety: Goal: Ability to remain free from injury will improve Outcome: Progressing   Problem: Self-Concept: Goal: Will verbalize positive feelings about self Outcome: Progressing   Problem: Education: Goal: Knowledge of the prescribed therapeutic regimen will improve Outcome: Progressing

## 2018-06-01 NOTE — Progress Notes (Signed)
[facility] MD Progress Note  06/01/2018 8:10 PM Kimberly Boyle  MRN:  [id number]  Subjective:    Kimberly Boyle is close not at her baseline, even though there is plenty of improvement. She still hallucinates, but privately, and has been paranoid. Today, when under duress, she consulted the voices on what to do. Luckily, she was not instructed to lose her temper when learned that she is homeless and unable to return to home with her parents. She has been repeatedly physically aggressive towards her mother and is on probation for assault. Her father said No. She met with [name] representative today and was trying to convince her that she is unsafe at home or in the hospital as there are people plotting to kill her.  We had a family meeting with her mother today. The mother is very upset herself as she did not anticipate such development. In all, she took disappointment of not being discharged, losing her housing, family relation and uncertainty of the future rather calmly. She did ask for PRN medications.  Principal Problem: Schizoaffective disorder, bipolar type ([place]) Diagnosis:   Patient Active Problem List   Diagnosis Date Noted  . Schizoaffective disorder, bipolar type ([place]) [F25.0] 05/06/2018    Priority: High  . Tobacco use disorder [F17.200] 05/08/2018  . Suicide attempt ([place]) [T14.91XA] 08/20/2017  . Cannabis use disorder, moderate, dependence ([place]) [F12.20] 08/20/2017  . Self-inflicted laceration of wrist [S61.519A] 08/20/2017  . Acetaminophen poisoning [T39.1X1A] 08/20/2017  . Generalized anxiety disorder [F41.1] 01/05/2017  . Mood disorder ([place]) [F39] 01/05/2017   Total Time spent with patient: 45 minutes  Past Psychiatric History: schizophrenia  Past Medical History:  Past Medical History:  Diagnosis Date  . Anxiety   . Arthritis   . Bipolar disorder ([place])   . Depression   . Major depressive disorder   . Panic disorder   . Schizophrenia ([place])    History reviewed. No  pertinent surgical history. Family History:  Family History  Problem Relation Age of Onset  . Post-traumatic stress disorder Mother   . Depression Mother   . Depression Maternal Uncle   . Cancer Maternal Grandmother    Family Psychiatric  History: depression Social History:  Social History   Substance and Sexual Activity  Alcohol Use No  . Frequency: Never     Social History   Substance and Sexual Activity  Drug Use No  . Types: Marijuana    Social History   Socioeconomic History  . Marital status: Single    Spouse name: Not on file  . Number of children: 0  . Years of education: Not on file  . Highest education level: High school graduate  Occupational History    Comment: unemployed  Social Needs  . Financial resource strain: Not on file  . Food insecurity:    Worry: Not on file    Inability: Not on file  . Transportation needs:    Medical: No    Non-medical: No  Tobacco Use  . Smoking status: Current Every Day Smoker    Packs/day: 0.25    Types: Cigarettes  . Smokeless tobacco: Never Used  Substance and Sexual Activity  . Alcohol use: No    Frequency: Never  . Drug use: No    Types: Marijuana  . Sexual activity: Not Currently  Lifestyle  . Physical activity:    Days per week: 0 days    Minutes per session: 0 min  . Stress: To some extent  Relationships  .  Social connections:    Talks on phone: Not on file    Gets together: Not on file    Attends religious service: Never    Active member of club or organization: No    Attends meetings of clubs or organizations: Never    Relationship status: Never married  Other Topics Concern  . Not on file  Social History Narrative   ** Merged History Encounter **       Additional Social History:  Specify valuables returned: See valuable form                      Sleep: Fair  Appetite:  Fair  Current Medications: Current Facility-Administered Medications  Medication Dose Route Frequency  Provider Last Rate Last Dose  . acetaminophen (TYLENOL) tablet 650 mg  650 mg Oral Q6H PRN [name], Madie Reno, MD   650 mg at 05/26/18 1815  . alum & mag hydroxide-simeth (MAALOX/MYLANTA) 200-200-20 MG/5ML suspension 30 mL  30 mL Oral Q4H PRN [name], [name], MD      . clonazePAM Bobbye [place]) tablet 0.5 mg  0.5 mg Oral BID PRN Chauncey Mann, MD   0.5 mg at 06/01/18 1712  . cloZAPine (CLOZARIL) tablet 400 mg  400 mg Oral QHS ,  B, MD   400 mg at 05/31/18 2111  . diphenhydrAMINE (BENADRYL) 50 MG/ML injection           . diphenhydrAMINE (BENADRYL) capsule 50 mg  50 mg Oral QHS PRN ,  B, MD   50 mg at 06/01/18 1456   Or  . diphenhydrAMINE (BENADRYL) injection 50 mg  50 mg Intramuscular Q6H PRN ,  B, MD      . docusate sodium (COLACE) capsule 100 mg  100 mg Oral Daily ,  B, MD   100 mg at 06/01/18 0746  . feeding supplement (ENSURE ENLIVE) (ENSURE ENLIVE) liquid 237 mL  237 mL Oral TID BM Lenward Chancellor, MD   237 mL at 06/01/18 2001  . gabapentin (NEURONTIN) capsule 300 mg  300 mg Oral TID ,  B, MD   300 mg at 06/01/18 1711  . haloperidol (HALDOL) tablet 10 mg  10 mg Oral Q6H PRN ,  B, MD   10 mg at 06/01/18 1456   Or  . haloperidol lactate (HALDOL) injection 10 mg  10 mg Intramuscular Q6H PRN ,  B, MD      . lithium carbonate capsule 750 mg  750 mg Oral BID WC ,  B, MD   750 mg at 06/01/18 1711  . LORazepam (ATIVAN) tablet 2 mg  2 mg Oral Q6H PRN ,  B, MD   2 mg at 06/01/18 1456   Or  . LORazepam (ATIVAN) injection 2 mg  2 mg Intramuscular Q6H PRN ,  B, MD      . magnesium hydroxide (MILK OF MAGNESIA) suspension 30 mL  30 mL Oral Daily PRN [name], [name], MD      . metFORMIN (GLUCOPHAGE) tablet 500 mg  500 mg Oral BID WC ,  B, MD   500 mg at 06/01/18 1711  . metoprolol tartrate (LOPRESSOR) tablet 25 mg  25 mg Oral  BID ,  B, MD   25 mg at 06/01/18 1711  . nicotine polacrilex (NICORETTE) gum 2 mg  2 mg Oral PRN ,  B, MD      . paliperidone (INVEGA SUSTENNA) injection 234 mg  234 mg Intramuscular Q28 days ,  B,  MD   234 mg at 05/12/18 1737    Lab Results:  Results for orders placed or performed during the hospital encounter of 05/06/18 (from the past 48 hour(s))  CBC with Differential/Platelet     Status: Abnormal   Collection Time: 05/31/18 11:37 AM  Result Value Ref Range   WBC 11.2 (H) 3.6 - 11.0 K/uL   RBC 4.20 3.80 - 5.20 MIL/uL   Hemoglobin 13.0 12.0 - 16.0 g/dL   HCT 39.1 35.0 - 47.0 %   MCV 93.1 80.0 - 100.0 fL   MCH 30.9 26.0 - 34.0 pg   MCHC 33.2 32.0 - 36.0 g/dL   RDW 12.9 11.5 - 14.5 %   Platelets 338 150 - 440 K/uL   Neutrophils Relative % 72 %   Neutro Abs 8.1 (H) 1.4 - 6.5 K/uL   Lymphocytes Relative 15 %   Lymphs Abs 1.6 1.0 - 3.6 K/uL   Monocytes Relative 10 %   Monocytes Absolute 1.1 (H) 0.2 - 0.9 K/uL   Eosinophils Relative 3 %   Eosinophils Absolute 0.3 0 - 0.7 K/uL   Basophils Relative 0 %   Basophils Absolute 0.0 0 - 0.1 K/uL    Comment: Performed at [facility], [address] [place], [place] [address]  Lithium level     Status: None   Collection Time: 05/31/18 11:37 AM  Result Value Ref Range   Lithium Lvl 1.09 0.60 - 1.20 mmol/L    Comment: Performed at [facility], [place], [place] [address]  Clozapine (clozaril)     Status: None   Collection Time: 05/31/18 11:37 AM  Result Value Ref Range   Clozapine Lvl 451 350 - 650 ng/mL    Comment: (NOTE) This test was developed and its performance characteristics determined by [facility]. It has not been cleared or approved by the Food and Drug Administration.    NorClozapine 206 Not Estab. ng/mL    Comment: (NOTE) This test was developed and its performance characteristics determined by [facility]. It has not been cleared or  approved by the Food and Drug Administration.    Total(Cloz+Norcloz) 657 ng/mL    Comment: (NOTE) Patients dosed with 400 mg clozapine daily for 4 weeks were most likely to exhibit a therapeutic effect when the sum of clozapine and norclozapine concentrations were at least 450 ng/mL. Vira Agar, et al. Rexford Maus Consensus Guidelines for Therapeutic Drug Monitoring in Psychiatry: Update 2011, Pharmacopsychiatry Sep 2011; 44(6):195-235.                                Detection Limit = 20 Performed At: [facility] [place], [place] [id number] Rush Farmer MD [id number]   Basic metabolic panel     Status: None   Collection Time: 05/31/18 11:37 AM  Result Value Ref Range   Sodium 140 135 - 145 mmol/L   Potassium 3.8 3.5 - 5.1 mmol/L   Chloride 106 98 - 111 mmol/L   CO2 28 22 - 32 mmol/L   Glucose, Bld 87 70 - 99 mg/dL   BUN 14 6 - 20 mg/dL   Creatinine, Ser 0.57 0.44 - 1.00 mg/dL   Calcium 9.1 8.9 - 10.3 mg/dL   GFR calc non Af Amer >60 >60 mL/min   GFR calc Af Amer >60 >60 mL/min    Comment: (NOTE) The eGFR has been calculated using the CKD EPI  equation. This calculation has not been validated in all clinical situations. eGFR's persistently <60 mL/min signify possible Chronic Kidney Disease.    Anion gap 6 5 - 15    Comment: Performed at [facility], [address] [place], [place] [address]    Blood Alcohol level:  Lab Results  Component Value Date   [facility] <10 05/05/2018   ETH <10 28/63/8177    Metabolic Disorder Labs: Lab Results  Component Value Date   HGBA1C 5.1 05/05/2018   MPG 99.67 05/05/2018   Lab Results  Component Value Date   PROLACTIN 132.8 (H) 10/25/2017   Lab Results  Component Value Date   CHOL 190 05/07/2018   TRIG 112 05/07/2018   HDL 44 05/07/2018   CHOLHDL 4.3 05/07/2018   VLDL 22 05/07/2018   LDLCALC 124 (H) 05/07/2018   LDLCALC 72 10/25/2017    Physical Findings: AIMS: Facial  and Oral Movements Muscles of Facial Expression: None, normal Lips and Perioral Area: None, normal Jaw: None, normal Tongue: None, normal,Extremity Movements Upper (arms, wrists, hands, fingers): None, normal Lower (legs, knees, ankles, toes): None, normal, Trunk Movements Neck, shoulders, hips: None, normal, Overall Severity Severity of abnormal movements (highest score from questions above): None, normal Incapacitation due to abnormal movements: None, normal Patient's awareness of abnormal movements (rate only patient's report): No Awareness, Dental Status Current problems with teeth and/or dentures?: No Does patient usually wear dentures?: No  CIWA:    COWS:     Musculoskeletal: Strength & Muscle Tone: within normal limits Gait & Station: normal Patient leans: N/A  Psychiatric Specialty Exam: Physical Exam  Nursing note and vitals reviewed. Psychiatric: Her speech is normal. Her affect is blunt. She is actively hallucinating. Thought content is paranoid. Cognition and memory are impaired. She expresses impulsivity.    Review of Systems  Neurological: Negative.   Psychiatric/Behavioral: Positive for hallucinations and substance abuse.  All other systems reviewed and are negative.   Blood pressure 130/87, pulse (!) 117, temperature (!) 97.5 F (36.4 C), temperature source Oral, resp. rate 18, height 5' 2" (1.575 m), weight 63.5 kg, SpO2 100 %.Body mass index is 25.61 kg/m.  General Appearance: Casual  Eye Contact:  Good  Speech:  Clear and Coherent  Volume:  Normal  Mood:  Euthymic  Affect:  Flat  Thought Process:  Goal Directed and Descriptions of Associations: Intact  Orientation:  Full (Time, Place, and Person)  Thought Content:  Delusions, Hallucinations: Auditory and Paranoid Ideation  Suicidal Thoughts:  No  Homicidal Thoughts:  No  Memory:  Immediate;   Poor Recent;   Poor Remote;   Poor  Judgement:  Poor  Insight:  Lacking  Psychomotor Activity:  Psychomotor  Retardation  Concentration:  Concentration: Poor and Attention Span: Poor  Recall:  Poor  Fund of Knowledge:  Fair  Language:  Fair  Akathisia:  No  Handed:  Right  AIMS (if indicated):     Assets:  Communication Skills Desire for Improvement Financial Resources/Insurance Physical Health Resilience Social Support  ADL's:  Intact  Cognition:  WNL  Sleep:  Number of Hours: 4     Treatment Plan Summary: Daily contact with patient to assess and evaluate symptoms and progress in treatment and Medication management   Kimberly Boyle is a 22 year old female with a history of schizoaffective disorder admitted for auditory hallucinations and paranoia in the context of medication noncompliance. She was started on medications and tolerated them well but her progress was very slow. Clozapine was added  to her regimen with improvement. At te time of discharge, the patient is not suicidal or homicidal but still mildly psychotic. She is still paranoid and has persisting auditory hallucinations at baseline.  #Schizoaffective disorder, improved -continueLithium 750 mg twice daily,Lithium level 1.05 on 8/20 -continuemonthly Invega sustenna 234 mg injections,next dose 06/08/2018 -continueClozapine433m nightly, level pending -if possible, the patient should be taking Fazaclo 400 mg nightly to improve compliance -continue Metformin 500 mg BID for metabolic syndrome prevention  #Anxiety -continue Clonazepam to 0.5 mg BID PRN  #Tachycardia -increase Metoprolol 25 mg BID  #Cannabis use disorder -She minimizes the use of marijuana and is not interested in any substance abuse treatment -She was advised to abstain from marijuana and all illicit drugs as they may worsen psychosis  #Smoking cessation -nicorette gum was available  #Labs -lipid panel, TSH, A1Care normal -EKGreviewed, NSR with QTc of 420 -pregnancy test is negative  #Disposition -discharge to home with her parents,  family meeting completed -we support guardianship and SSD applications  -she will follow up with [name] team -90 days of involuntary outpatient commitment  JOrson Slick MD 06/01/2018, 8:10 PM

## 2018-06-02 NOTE — Progress Notes (Signed)
Recreation Therapy Notes  Date: 06/02/2018  Time: 9:30 am   Location: Craft Room   Behavioral response: N/A   Intervention Topic: Happiness  Discussion/Intervention: Patient did not attend group.   Clinical Observations/Feedback:  Patient did not attend group.   Tenna Lacko LRT/CTRS        Keyonda Bickle 06/02/2018 11:25 AM

## 2018-06-02 NOTE — Progress Notes (Signed)
D- Patient alert and oriented. Patient presents in a drowsy, but pleasant mood on assessment stating to this writer "they put me under, so yeah" when asked how she slept last night. Patient had no major complaints to voice to this Clinical research associatewriter. Patient denies SI, HI, AVH, and pain at this time. Patient also denies any depression and anxiety. Patient has no stated goals for today.  A- Scheduled medications administered to patient, per MD orders. Support and encouragement provided.  Routine safety checks conducted every 15 minutes.  Patient informed to notify staff with problems or concerns.  R- No adverse drug reactions noted. Patient contracts for safety at this time. Patient compliant with medications and treatment plan. Patient receptive, calm, and cooperative. Patient interacts well with others on the unit.  Patient remains safe at this time.

## 2018-06-02 NOTE — Plan of Care (Signed)
Multiple frequents to the nurses' station just after the shift report, "the doctor and the nurse said I can get 3 shots to my butt and I want it now..." Dissuaded, PO offered but insisted to have IM. She received standing medication - clozapine 400 mg @ 2123, came back to ask for IM meds (Ativan 2 mg, Haldol 10 mg and Benadryl 50 mg) given. When asked why all these medications now, "I was mad at my Step Dad, is a d**k, he is fool of sh**t, he created a lot of problems for me. I [place] doing [place] in [place] before he and my mom asked me to move in with them here in [place], now he doesn't want me in the house.."  Patient slept for Estimated Hours of 7; Precautionary checks every 15 minutes for safety maintained, room free of safety hazards, patient sustains no injury or falls during this shift.  Problem: Education: Goal: Mental status will improve Outcome: Progressing   Problem: Coping: Goal: Coping ability will improve Outcome: Progressing Goal: Will verbalize feelings Outcome: Progressing   Problem: Safety: Goal: Ability to remain free from injury will improve Outcome: Progressing   Problem: Self-Concept: Goal: Will verbalize positive feelings about self Outcome: Progressing   Problem: Education: Goal: Knowledge of the prescribed therapeutic regimen will improve Outcome: Progressing   Problem: Coping: Goal: Will verbalize feelings Outcome: Progressing   Problem: Health Behavior/Discharge Planning: Goal: Compliance with therapeutic regimen will improve Outcome: Progressing

## 2018-06-02 NOTE — Progress Notes (Addendum)
[facility] MD Progress Note  06/02/2018 12:51 AM Tymber Stallings  MRN:  [id number]  Subjective:   In spite of disappointment of not being able to go back home, Ms. Biever remains cool and collected. She ate lunch and continues to stay in bed. She seem somewhat confused, unable to grasp that there will be guardianship hearing and that she will need to meet with the guardian at litem. She keeps asking if the guardian is like the police to watch her all the time. Still pranoid, afraid that someone is going to kill her. Her hygiene is bad, her room a mess.  She slept poorly, BP is normal but HR elevated in spite of Metoprolol.  Principal Problem: Schizoaffective disorder, bipolar type ([place]) Diagnosis:   Patient Active Problem List   Diagnosis Date Noted  . Schizoaffective disorder, bipolar type ([place]) [F25.0] 05/06/2018    Priority: High  . Tobacco use disorder [F17.200] 05/08/2018  . Suicide attempt ([place]) [T14.91XA] 08/20/2017  . Cannabis use disorder, moderate, dependence ([place]) [F12.20] 08/20/2017  . Self-inflicted laceration of wrist [S61.519A] 08/20/2017  . Acetaminophen poisoning [T39.1X1A] 08/20/2017  . Generalized anxiety disorder [F41.1] 01/05/2017  . Mood disorder ([place]) [F39] 01/05/2017   Total Time spent with patient: 20 minutes  Past Psychiatric History: schizophrenia  Past Medical History:  Past Medical History:  Diagnosis Date  . Anxiety   . Arthritis   . Bipolar disorder ([place])   . Depression   . Major depressive disorder   . Panic disorder   . Schizophrenia ([place])    History reviewed. No pertinent surgical history. Family History:  Family History  Problem Relation Age of Onset  . Post-traumatic stress disorder Mother   . Depression Mother   . Depression Maternal Uncle   . Cancer Maternal Grandmother    Family Psychiatric  History: none Social History:  Social History   Substance and Sexual Activity  Alcohol Use No  . Frequency: Never     Social History    Substance and Sexual Activity  Drug Use No  . Types: Marijuana    Social History   Socioeconomic History  . Marital status: Single    Spouse name: Not on file  . Number of children: 0  . Years of education: Not on file  . Highest education level: High school graduate  Occupational History    Comment: unemployed  Social Needs  . Financial resource strain: Not on file  . Food insecurity:    Worry: Not on file    Inability: Not on file  . Transportation needs:    Medical: No    Non-medical: No  Tobacco Use  . Smoking status: Current Every Day Smoker    Packs/day: 0.25    Types: Cigarettes  . Smokeless tobacco: Never Used  Substance and Sexual Activity  . Alcohol use: No    Frequency: Never  . Drug use: No    Types: Marijuana  . Sexual activity: Not Currently  Lifestyle  . Physical activity:    Days per week: 0 days    Minutes per session: 0 min  . Stress: To some extent  Relationships  . Social connections:    Talks on phone: Not on file    Gets together: Not on file    Attends religious service: Never    Active member of club or organization: No    Attends meetings of clubs or organizations: Never    Relationship status: Never married  Other Topics Concern  . Not on  file  Social History Narrative   ** Merged History Encounter **       Additional Social History:  Specify valuables returned: See valuable form                      Sleep: Poor  Appetite:  Fair  Current Medications: Current Facility-Administered Medications  Medication Dose Route Frequency Provider Last Rate Last Dose  . acetaminophen (TYLENOL) tablet 650 mg  650 mg Oral Q6H PRN [name], Madie Reno, MD   650 mg at 05/26/18 1815  . alum & mag hydroxide-simeth (MAALOX/MYLANTA) 200-200-20 MG/5ML suspension 30 mL  30 mL Oral Q4H PRN [name], [name], MD      . clonazePAM Bobbye [place]) tablet 0.5 mg  0.5 mg Oral BID PRN Chauncey Mann, MD   0.5 mg at 06/01/18 1712  . cloZAPine (CLOZARIL)  tablet 400 mg  400 mg Oral QHS Pucilowska, [name], MD   400 mg at 06/01/18 2123  . diphenhydrAMINE (BENADRYL) capsule 50 mg  50 mg Oral QHS PRN Pucilowska, [name], MD   50 mg at 06/01/18 1456   Or  . diphenhydrAMINE (BENADRYL) injection 50 mg  50 mg Intramuscular Q6H PRN Pucilowska, [name], MD   50 mg at 06/01/18 2214  . docusate sodium (COLACE) capsule 100 mg  100 mg Oral Daily Pucilowska, [name], MD   100 mg at 06/01/18 0746  . feeding supplement (ENSURE ENLIVE) (ENSURE ENLIVE) liquid 237 mL  237 mL Oral TID BM Lenward Chancellor, MD   237 mL at 06/01/18 2001  . gabapentin (NEURONTIN) capsule 300 mg  300 mg Oral TID Pucilowska, [name], MD   300 mg at 06/01/18 1711  . haloperidol (HALDOL) tablet 10 mg  10 mg Oral Q6H PRN Pucilowska, [name], MD   10 mg at 06/01/18 1456   Or  . haloperidol lactate (HALDOL) injection 10 mg  10 mg Intramuscular Q6H PRN Pucilowska, [name], MD   10 mg at 06/01/18 2209  . lithium carbonate capsule 750 mg  750 mg Oral BID WC Pucilowska, [name], MD   750 mg at 06/01/18 1711  . LORazepam (ATIVAN) tablet 2 mg  2 mg Oral Q6H PRN Pucilowska, [name], MD   2 mg at 06/01/18 1456   Or  . LORazepam (ATIVAN) injection 2 mg  2 mg Intramuscular Q6H PRN Pucilowska, [name], MD   2 mg at 06/01/18 2209  . magnesium hydroxide (MILK OF MAGNESIA) suspension 30 mL  30 mL Oral Daily PRN [name], [name], MD      . metFORMIN (GLUCOPHAGE) tablet 500 mg  500 mg Oral BID WC Pucilowska, [name], MD   500 mg at 06/01/18 1711  . metoprolol tartrate (LOPRESSOR) tablet 25 mg  25 mg Oral BID Pucilowska, [name], MD   25 mg at 06/01/18 1711  . nicotine polacrilex (NICORETTE) gum 2 mg  2 mg Oral PRN Pucilowska, [name], MD      . paliperidone (INVEGA SUSTENNA) injection 234 mg  234 mg Intramuscular Q28 days Pucilowska, [name], MD   234 mg at 05/12/18 1737    Lab Results:  Results for orders placed or performed during the hospital encounter of 05/06/18 (from the  past 48 hour(s))  CBC with Differential/Platelet     Status: Abnormal   Collection Time: 05/31/18 11:37 AM  Result Value Ref Range   WBC 11.2 (H) 3.6 - 11.0 K/uL   RBC 4.20 3.80 -  5.20 MIL/uL   Hemoglobin 13.0 12.0 - 16.0 g/dL   HCT 39.1 35.0 - 47.0 %   MCV 93.1 80.0 - 100.0 fL   MCH 30.9 26.0 - 34.0 pg   MCHC 33.2 32.0 - 36.0 g/dL   RDW 12.9 11.5 - 14.5 %   Platelets 338 150 - 440 K/uL   Neutrophils Relative % 72 %   Neutro Abs 8.1 (H) 1.4 - 6.5 K/uL   Lymphocytes Relative 15 %   Lymphs Abs 1.6 1.0 - 3.6 K/uL   Monocytes Relative 10 %   Monocytes Absolute 1.1 (H) 0.2 - 0.9 K/uL   Eosinophils Relative 3 %   Eosinophils Absolute 0.3 0 - 0.7 K/uL   Basophils Relative 0 %   Basophils Absolute 0.0 0 - 0.1 K/uL    Comment: Performed at [facility], [address] [place], [place] [address]  Lithium level     Status: None   Collection Time: 05/31/18 11:37 AM  Result Value Ref Range   Lithium Lvl 1.09 0.60 - 1.20 mmol/L    Comment: Performed at [facility], [address] [place], [place] [address]  Clozapine (clozaril)     Status: None   Collection Time: 05/31/18 11:37 AM  Result Value Ref Range   Clozapine Lvl 451 350 - 650 ng/mL    Comment: (NOTE) This test was developed and its performance characteristics determined by [facility]. It has not been cleared or approved by the Food and Drug Administration.    NorClozapine 206 Not Estab. ng/mL    Comment: (NOTE) This test was developed and its performance characteristics determined by [facility]. It has not been cleared or approved by the Food and Drug Administration.    Total(Cloz+Norcloz) 657 ng/mL    Comment: (NOTE) Patients dosed with 400 mg clozapine daily for 4 weeks were most likely to exhibit a therapeutic effect when the sum of clozapine and norclozapine concentrations were at least 450 ng/mL. Vira Agar, et al. Rexford Maus Consensus Guidelines for Therapeutic Drug Monitoring in  Psychiatry: Update 2011, Pharmacopsychiatry Sep 2011; 44(6):195-235.                                Detection Limit = 20 Performed At: [facility] [place], [place] [id number] Rush Farmer MD QI:[id number]   Basic metabolic panel     Status: None   Collection Time: 05/31/18 11:37 AM  Result Value Ref Range   Sodium 140 135 - 145 mmol/L   Potassium 3.8 3.5 - 5.1 mmol/L   Chloride 106 98 - 111 mmol/L   CO2 28 22 - 32 mmol/L   Glucose, Bld 87 70 - 99 mg/dL   BUN 14 6 - 20 mg/dL   Creatinine, Ser 0.57 0.44 - 1.00 mg/dL   Calcium 9.1 8.9 - 10.3 mg/dL   GFR calc non Af Amer >60 >60 mL/min   GFR calc Af Amer >60 >60 mL/min    Comment: (NOTE) The eGFR has been calculated using the CKD EPI equation. This calculation has not been validated in all clinical situations. eGFR's persistently <60 mL/min signify possible Chronic Kidney Disease.    Anion gap 6 5 - 15    Comment: Performed at [facility], [address] [place], [place] [address]    Blood Alcohol level:  Lab Results  Component Value Date   ETH <10 05/05/2018   ETH <10 10/12/2017  Metabolic Disorder Labs: Lab Results  Component Value Date   HGBA1C 5.1 05/05/2018   MPG 99.67 05/05/2018   Lab Results  Component Value Date   PROLACTIN 132.8 (H) 10/25/2017   Lab Results  Component Value Date   CHOL 190 05/07/2018   TRIG 112 05/07/2018   HDL 44 05/07/2018   CHOLHDL 4.3 05/07/2018   VLDL 22 05/07/2018   LDLCALC 124 (H) 05/07/2018   LDLCALC 72 10/25/2017    Physical Findings: AIMS: Facial and Oral Movements Muscles of Facial Expression: None, normal Lips and Perioral Area: None, normal Jaw: None, normal Tongue: None, normal,Extremity Movements Upper (arms, wrists, hands, fingers): None, normal Lower (legs, knees, ankles, toes): None, normal, Trunk Movements Neck, shoulders, hips: None, normal, Overall Severity Severity of abnormal movements (highest score from questions  above): None, normal Incapacitation due to abnormal movements: None, normal Patient's awareness of abnormal movements (rate only patient's report): No Awareness, Dental Status Current problems with teeth and/or dentures?: No Does patient usually wear dentures?: No  CIWA:    COWS:     Musculoskeletal: Strength & Muscle Tone: within normal limits Gait & Station: normal Patient leans: N/A  Psychiatric Specialty Exam: Physical Exam  Nursing note and vitals reviewed. Psychiatric: Her speech is normal. Her affect is blunt. She is withdrawn and actively hallucinating. Thought content is paranoid. Cognition and memory are normal. She expresses impulsivity.    Review of Systems  Neurological: Negative.   Psychiatric/Behavioral: Positive for hallucinations and substance abuse. The patient has insomnia.   All other systems reviewed and are negative.   Blood pressure 130/87, pulse (!) 117, temperature (!) 97.5 F (36.4 C), temperature source Oral, resp. rate 18, height _0  (1.575 m), weight 63.5 kg, SpO2 100 %.Body mass index is 25.61 kg/m.  General Appearance: Fairly Groomed  Eye Contact:  Good  Speech:  Slow  Volume:  Decreased  Mood:  Dysphoric  Affect:  Congruent  Thought Process:  Linear and Descriptions of Associations: Tangential  Orientation:  Full (Time, Place, and Person)  Thought Content:  Delusions, Hallucinations: Auditory and Paranoid Ideation  Suicidal Thoughts:  No  Homicidal Thoughts:  No  Memory:  Immediate;   Poor Recent;   Poor Remote;   Poor  Judgement:  Impaired  Insight:  Lacking  Psychomotor Activity:  Decreased  Concentration:  Concentration: Poor and Attention Span: Poor  Recall:  Poor  Fund of Knowledge:  Fair  Language:  Fair  Akathisia:  No  Handed:  Right  AIMS (if indicated):     Assets:  Communication Skills Desire for Improvement Financial Resources/Insurance Physical Health Resilience Social Support  ADL's:  Intact  Cognition:  WNL   Sleep:  Number of Hours: 4     Treatment Plan Summary: Daily contact with patient to assess and evaluate symptoms and progress in treatment and Medication management   Ms. Albino is a 22 year old female with a history of schizoaffective disorder admitted for auditory hallucinations and paranoia in the context of medication noncompliance. She was started on medications and tolerated them well but her progress was very slow. Clozapine was added to her regimen with improvement. At te time of discharge, the patient is not suicidal or homicidal but still mildly psychotic. She is still paranoid and has persisting auditory hallucinations at baseline.  There will be a guardianship hearing and the patient is going to be served cort papers.   #Schizoaffective disorder, improved -continueLithium 750 mg twice daily,Lithium level 1.05 on 8/20 -continuemonthly Invega sustenna 234  mg injections,next dose 06/08/2018 -continueClozapine435m nightly, level 657 on 8/20 -if possible, the patient should be taking Fazaclo 400 mg nightly to improve compliance -continue Metformin 500 mg BID for metabolic syndrome prevention  #Anxiety -continue Clonazepam to 0.5 mg BID PRN  #Tachycardia -continue Metoprolol 25 mg BID  #Cannabis use disorder -She minimizes the use of marijuana and is not interested in any substance abuse treatment -She was advised to abstain from marijuana and all illicit drugs as they may worsen psychosis  #Smoking cessation -nicorette gum was available  #Labs -lipid panel, TSH, [facility]1Care normal -EKGreviewed, NSR with QTc of 420 -pregnancy test is negative  #Disposition -discharge to home with her parents, family meeting completed -we support guardianship and SSD applications  -she will follow up with [name] ACT team -90 days of involuntary outpatient commitment   JOrson Slick MD 06/02/2018, 12:51 AM

## 2018-06-02 NOTE — [facility] Group Notes (Signed)
LCSW Group Therapy Note  06/02/2018 1:00pm  Type of Therapy/Topic:  Group Therapy:  Balance in Life  Participation Level:  Did Not Attend  Description of Group:    This group will address the concept of balance and how it feels and looks when one is unbalanced. Patients will be encouraged to process areas in their lives that are out of balance and identify reasons for remaining unbalanced. Facilitators will guide patients in utilizing problem-solving interventions to address and correct the stressor making their life unbalanced. Understanding and applying boundaries will be explored and addressed for obtaining and maintaining a balanced life. Patients will be encouraged to explore ways to assertively make their unbalanced needs known to significant others in their lives, using other group members and facilitator for support and feedback.  Therapeutic Goals: 1. Patient will identify two or more emotions or situations they have that consume much of in their lives. 2. Patient will identify signs/triggers that life has become out of balance:  3. Patient will identify two ways to set boundaries in order to achieve balance in their lives:  4. Patient will demonstrate ability to communicate their needs through discussion and/or role plays  Summary of Patient Progress:      Therapeutic Modalities:   Cognitive Behavioral Therapy Solution-Focused Therapy Assertiveness Training  Alease FrameSonya S Jesselle Laflamme, Alexander MtLCSW 06/02/2018 4:48 PM

## 2018-06-02 NOTE — Plan of Care (Signed)
Patient verbalizes understanding of the general information that's been provided to her as well as her prescribed therapeutic regimen and all questions/concerns have been addressed and answered at this time. Patient denies SI/HI/AVH as well as depression and anxiety at this time. Patient had no stated goals for today and she has remained free from injury thus far on the unit. Patient has the ability to utilize techniques to improve his thought processes and she has shown some interest in leisure activities. Patient slept through majority of the morning, but has been present in the milieu throughout the afternoon. Patient has the ability to make decisions regarding her health-care and has been in compliance with her therapeutic regimen. Patient has demonstrated positive behaviors and remains safe on the unit at this time.  Problem: Education: Goal: Knowledge of [place] [facility] Education information/materials will improve Outcome: Progressing Goal: Mental status will improve Outcome: Progressing   Problem: Coping: Goal: Coping ability will improve Outcome: Progressing Goal: Will verbalize feelings Outcome: Progressing   Problem: Safety: Goal: Ability to remain free from injury will improve Outcome: Progressing   Problem: Self-Concept: Goal: Will verbalize positive feelings about self Outcome: Progressing   Problem: Education: Goal: Utilization of techniques to improve thought processes will improve Outcome: Progressing Goal: Knowledge of the prescribed therapeutic regimen will improve Outcome: Progressing   Problem: Activity: Goal: Interest or engagement in leisure activities will improve Outcome: Progressing Goal: Imbalance in normal sleep/wake cycle will improve Outcome: Progressing   Problem: Coping: Goal: Coping ability will improve Outcome: Progressing Goal: Will verbalize feelings Outcome: Progressing   Problem: Health Behavior/Discharge Planning: Goal: Ability to  make decisions will improve Outcome: Progressing Goal: Compliance with therapeutic regimen will improve Outcome: Progressing   Problem: Role Relationship: Goal: Will demonstrate positive changes in social behaviors and relationships Outcome: Progressing   Problem: Safety: Goal: Ability to disclose and discuss suicidal ideas will improve Outcome: Progressing Goal: Ability to identify and utilize support systems that promote safety will improve Outcome: Progressing   Problem: Self-Concept: Goal: Will verbalize positive feelings about self Outcome: Progressing Goal: Level of anxiety will decrease Outcome: Progressing   Problem: Education: Goal: Knowledge of General Education information will improve Description Including pain rating scale, medication(s)/side effects and non-pharmacologic comfort measures  Outcome: Progressing

## 2018-06-02 NOTE — [facility] Group Notes (Signed)
[facility] Group Notes:  (Nursing/MHT/Case Management/Adjunct)  Date:  06/02/2018  Time:  11:31 PM  Type of Therapy:  Group Therapy  Participation Level:  Active  Participation Quality:  Appropriate  Affect:  Appropriate  Cognitive:  Appropriate  Insight:  Appropriate  Engagement in Group:  Engaged  Modes of Intervention:  Discussion  Summary of Progress/Problems: Kimberly Boyle stated she was ready to be discharged. Kimberly Boyle stated her goal was to go home. Kimberly Boyle was quiet during group. Kimberly Boyle did not present any problems on this day.  Jinger NeighborsKeith D Kimesha Claxton 06/02/2018, 11:31 PM

## 2018-06-02 NOTE — Progress Notes (Signed)
[facility] MD Progress Note  06/03/2018 10:07 AM Kimberly Boyle  MRN:  [id number]  Subjective:    Kimberly Boyle has been asleep this morning but upon awakening, refused all her morning medications after asking what they were for. She usually takes her medications later during the day when fully awake. She is still very paranoid and hallucinating. She is very careful with me not to disclose any of her symptoms and always denies voices even when she is actively hallucinating. She, in general, "does not like women" but has been talking more to Kimberly Boyle, her female nurse. She has been on internet a lot which then frightenes her feeding into her delusions and paranoia.   She has no somatic complaints and has been rather bitter about her relationship with her parents. I do not believes she was served cort papers for the guardianship hearing. She is awaiting a visit from guardian ad litem.   Principal Problem: Schizoaffective disorder, bipolar type (HCC) Diagnosis:   Patient Active Problem List   Diagnosis Date Noted  . Schizoaffective disorder, bipolar type (HCC) [F25.0] 05/06/2018    Priority: High  . Tobacco use disorder [F17.200] 05/08/2018  . Suicide attempt (HCC) [T14.91XA] 08/20/2017  . Cannabis use disorder, moderate, dependence (HCC) [F12.20] 08/20/2017  . Self-inflicted laceration of wrist [S61.519A] 08/20/2017  . Acetaminophen poisoning [T39.1X1A] 08/20/2017  . Generalized anxiety disorder [F41.1] 01/05/2017  . Mood disorder (HCC) [F39] 01/05/2017   Total Time spent with patient: 20 minutes  Past Psychiatric History: schizoaffective disorder  Past Medical History:  Past Medical History:  Diagnosis Date  . Anxiety   . Arthritis   . Bipolar disorder (HCC)   . Depression   . Major depressive disorder   . Panic disorder   . Schizophrenia (HCC)    History reviewed. No pertinent surgical history. Family History:  Family History  Problem Relation Age of Onset  . Post-traumatic stress  disorder Mother   . Depression Mother   . Depression Maternal Uncle   . Cancer Maternal Grandmother    Family Psychiatric  History: none Social History:  Social History   Substance and Sexual Activity  Alcohol Use No  . Frequency: Never     Social History   Substance and Sexual Activity  Drug Use No  . Types: Marijuana    Social History   Socioeconomic History  . Marital status: Single    Spouse name: Not on file  . Number of children: 0  . Years of education: Not on file  . Highest education level: High school graduate  Occupational History    Comment: unemployed  Social Needs  . Financial resource strain: Not on file  . Food insecurity:    Worry: Not on file    Inability: Not on file  . Transportation needs:    Medical: No    Non-medical: No  Tobacco Use  . Smoking status: Current Every Day Smoker    Packs/day: 0.25    Types: Cigarettes  . Smokeless tobacco: Never Used  Substance and Sexual Activity  . Alcohol use: No    Frequency: Never  . Drug use: No    Types: Marijuana  . Sexual activity: Not Currently  Lifestyle  . Physical activity:    Days per week: 0 days    Minutes per session: 0 min  . Stress: To some extent  Relationships  . Social connections:    Talks on phone: Not on file    Gets together: Not on file  Attends religious service: Never    Active member of club or organization: No    Attends meetings of clubs or organizations: Never    Relationship status: Never married  Other Topics Concern  . Not on file  Social History Narrative   ** Merged History Encounter **       Additional Social History:  Specify valuables returned: See valuable form                      Sleep: Fair  Appetite:  Fair  Current Medications: Current Facility-Administered Medications  Medication Dose Route Frequency Provider Last Rate Last Dose  . acetaminophen (TYLENOL) tablet 650 mg  650 mg Oral Q6H PRN [name], Jackquline DenmarkJohn T, MD   650 mg at  06/02/18 2226  . alum & mag hydroxide-simeth (MAALOX/MYLANTA) 200-200-20 MG/5ML suspension 30 mL  30 mL Oral Q4H PRN [name], [name], MD      . clonazePAM Scarlette Calico(KLONOPIN) tablet 0.5 mg  0.5 mg Oral BID PRN Darliss RidgelKapur, Aarti K, MD   0.5 mg at 06/02/18 1505  . cloZAPine (CLOZARIL) tablet 400 mg  400 mg Oral QHS Lamondre Wesche B, MD   400 mg at 06/02/18 2103  . diphenhydrAMINE (BENADRYL) capsule 50 mg  50 mg Oral QHS PRN Haadiya Frogge B, MD   50 mg at 06/01/18 1456   Or  . diphenhydrAMINE (BENADRYL) injection 50 mg  50 mg Intramuscular Q6H PRN Jeroline Wolbert B, MD   50 mg at 06/01/18 2214  . docusate sodium (COLACE) capsule 100 mg  100 mg Oral Daily Zenita Kister B, MD   100 mg at 06/02/18 0924  . feeding supplement (ENSURE ENLIVE) (ENSURE ENLIVE) liquid 237 mL  237 mL Oral TID BM Beverly SessionsSubedi, Jagannath, MD   237 mL at 06/02/18 2001  . gabapentin (NEURONTIN) capsule 300 mg  300 mg Oral TID Broden Holt B, MD   300 mg at 06/02/18 1703  . haloperidol (HALDOL) tablet 10 mg  10 mg Oral Q6H PRN Icis Budreau B, MD   10 mg at 06/01/18 1456   Or  . haloperidol lactate (HALDOL) injection 10 mg  10 mg Intramuscular Q6H PRN Freja Faro B, MD   10 mg at 06/01/18 2209  . lithium carbonate capsule 750 mg  750 mg Oral BID WC Izeyah Deike B, MD   750 mg at 06/02/18 1703  . LORazepam (ATIVAN) tablet 2 mg  2 mg Oral Q6H PRN Sabrinia Prien B, MD   2 mg at 06/01/18 1456   Or  . LORazepam (ATIVAN) injection 2 mg  2 mg Intramuscular Q6H PRN Sussie Minor B, MD   2 mg at 06/01/18 2209  . magnesium hydroxide (MILK OF MAGNESIA) suspension 30 mL  30 mL Oral Daily PRN [name], [name], MD      . metFORMIN (GLUCOPHAGE) tablet 500 mg  500 mg Oral BID WC Mona Ayars B, MD   500 mg at 06/02/18 1703  . metoprolol tartrate (LOPRESSOR) tablet 25 mg  25 mg Oral BID Laryssa Hassing B, MD   25 mg at 06/02/18 1703  . nicotine polacrilex (NICORETTE) gum 2 mg  2 mg Oral PRN  Greydis Stlouis B, MD   2 mg at 06/02/18 1704  . paliperidone (INVEGA SUSTENNA) injection 234 mg  234 mg Intramuscular Q28 days Elysa Womac B, MD   234 mg at 05/12/18 1737    Lab Results: No results found for this or any previous visit (from the past 48  hour(s)).  Blood Alcohol level:  Lab Results  Component Value Date   ETH <10 05/05/2018   ETH <10 10/12/2017    Metabolic Disorder Labs: Lab Results  Component Value Date   HGBA1C 5.1 05/05/2018   MPG 99.67 05/05/2018   Lab Results  Component Value Date   PROLACTIN 132.8 (H) 10/25/2017   Lab Results  Component Value Date   CHOL 190 05/07/2018   TRIG 112 05/07/2018   HDL 44 05/07/2018   CHOLHDL 4.3 05/07/2018   VLDL 22 05/07/2018   LDLCALC 124 (H) 05/07/2018   LDLCALC 72 10/25/2017    Physical Findings: AIMS: Facial and Oral Movements Muscles of Facial Expression: None, normal Lips and Perioral Area: None, normal Jaw: None, normal Tongue: None, normal,Extremity Movements Upper (arms, wrists, hands, fingers): None, normal Lower (legs, knees, ankles, toes): None, normal, Trunk Movements Neck, shoulders, hips: None, normal, Overall Severity Severity of abnormal movements (highest score from questions above): None, normal Incapacitation due to abnormal movements: None, normal Patient's awareness of abnormal movements (rate only patient's report): No Awareness, Dental Status Current problems with teeth and/or dentures?: No Does patient usually wear dentures?: No  CIWA:    COWS:     Musculoskeletal: Strength & Muscle Tone: within normal limits Gait & Station: normal Patient leans: N/A  Psychiatric Specialty Exam: Physical Exam  Nursing note and vitals reviewed. Psychiatric: Her speech is normal. She is actively hallucinating. Thought content is paranoid and delusional. Cognition and memory are impaired. She expresses impulsivity.    Review of Systems  Neurological: Negative.   Psychiatric/Behavioral:  Positive for hallucinations.  All other systems reviewed and are negative.   Blood pressure 117/63, pulse (!) 106, temperature 98.2 F (36.8 C), temperature source Oral, resp. rate 18, height 5\' 2"  (1.575 m), weight 63.5 kg, SpO2 100 %.Body mass index is 25.61 kg/m.  General Appearance: Bizarre  Eye Contact:  Good  Speech:  Clear and Coherent and Slow  Volume:  Decreased  Mood:  Euthymic  Affect:  Flat  Thought Process:  Disorganized and Descriptions of Associations: Tangential  Orientation:  Full (Time, Place, and Person)  Thought Content:  Delusions, Hallucinations: Auditory and Paranoid Ideation  Suicidal Thoughts:  No  Homicidal Thoughts:  No  Memory:  Immediate;   Poor Recent;   Poor Remote;   Poor  Judgement:  Poor  Insight:  Lacking  Psychomotor Activity:  Psychomotor Retardation  Concentration:  Concentration: Poor and Attention Span: Poor  Recall:  Poor  Fund of Knowledge:  Poor  Language:  Fair  Akathisia:  No  Handed:  Right  AIMS (if indicated):     Assets:  Communication Skills Desire for Improvement Financial Resources/Insurance Physical Health Resilience  ADL's:  Intact  Cognition:  WNL  Sleep:  Number of Hours: 7     Treatment Plan Summary: Daily contact with patient to assess and evaluate symptoms and progress in treatment and Medication management   Kimberly Boyle is a 22 year old female with a history of schizoaffective disorder admitted for auditory hallucinations and paranoia in the context of medication noncompliance.She was started on medications and tolerated them well but her progress was very slow. Clozapine was added to her regimen with improvement. At te time of discharge, the patient is not suicidal or homicidal but still mildly psychotic.She is still paranoid and has persisting auditory hallucinations at baseline.  There will be a guardianship hearing and the patient is going to be served cort papers.   #Schizoaffective  disorder,improved -continueLithium 750  mg twice daily,Lithium level1.05 on 8/20 -continuemonthly Invega sustenna 234 mg injections,next dose 06/08/2018 -continueClozapine400mg  nightly, level 657 on 8/20 -if possible, the patient should be taking Fazaclo 400 mg nightly to improve compliance at home -continueMetformin 500 mg BID for metabolic syndrome prevention  #Anxiety -continueClonazepam to 0.5 mg BID PRN  #Tachycardia -continue Metoprolol 25 mg BID  #Cannabis use disorder -She minimizes the use of marijuana and is not interested in any substance abuse treatment -She was advised to abstain from marijuana and all illicit drugs as they may worsen psychosis  #Smoking cessation -nicorette gumwasavailable  #Labs -lipid panel, TSH, A1Care normal -EKGreviewed, NSR with QTc of 420 -pregnancy test is negative  #Disposition -discharge to home with her parents, family meeting completed -we support guardianship and SSD applications  -she will follow up with [name] team -90 days ofinvoluntaryoutpatient commitment  Kristine Linea, MD 06/03/2018, 10:07 AM

## 2018-06-03 NOTE — Progress Notes (Signed)
D- Patient alert and oriented. Patient presents in a drowsy, but pleasant mood on assessment stating that she slept ok last night. Patient denies SI, HI, AVH, and pain at this time. Patient also denies any signs/symptoms of depression and anxiety. Patient had no stated goals at this time.  A- Support and encouragement provided. Routine safety checks conducted every 15 minutes.  Patient informed to notify staff with problems or concerns.  R- Patient contracts for safety at this time. Patient receptive, calm, and cooperative. Patient interacts well with others on the unit.  Patient remains safe at this time.

## 2018-06-03 NOTE — Progress Notes (Signed)
Patient continues to refuse her scheduled medications. MD notified.

## 2018-06-03 NOTE — [facility] Group Notes (Signed)
06/03/2018 1PM  Type of Therapy and Topic:  Group Therapy:  Feelings around Relapse and Recovery  Participation Level:  Did Not Attend   Description of Group:    Patients in this group will discuss emotions they experience before and after a relapse. They will process how experiencing these feelings, or avoidance of experiencing them, relates to having a relapse. Facilitator will guide patients to explore emotions they have related to recovery. Patients will be encouraged to process which emotions are more powerful. They will be guided to discuss the emotional reaction significant others in their lives may have to patients' relapse or recovery. Patients will be assisted in exploring ways to respond to the emotions of others without this contributing to a relapse.  Therapeutic Goals: 1. Patient will identify two or more emotions that lead to a relapse for them 2. Patient will identify two emotions that result when they relapse 3. Patient will identify two emotions related to recovery 4. Patient will demonstrate ability to communicate their needs through discussion and/or role plays   Summary of Patient Progress: Patient was encouraged and invited to attend group. Patient did not attend group. Social worker will continue to encourage group participation in the future.      Therapeutic Modalities:   Cognitive Behavioral Therapy Solution-Focused Therapy Assertiveness Training Relapse Prevention Therapy   Johny ShearsCassandra  Cherissa Hook, Alexander MtLCSW 06/03/2018 2:32 PM

## 2018-06-03 NOTE — Progress Notes (Signed)
[facility] MD Progress Note  06/03/2018 8:25 PM Kimberly Boyle  MRN:  [id number]  Subjective:   Principal Problem: Schizoaffective disorder, bipolar type [facility]) Diagnosis:   Patient Active Problem List   Diagnosis Date Noted  . Schizoaffective disorder, bipolar type (HCC) [F25.0] 05/06/2018    Priority: High  . Tobacco use disorder [F17.200] 05/08/2018  . Suicide attempt (HCC) [T14.91XA] 08/20/2017  . Cannabis use disorder, moderate, dependence (HCC) [F12.20] 08/20/2017  . Self-inflicted laceration of wrist [S61.519A] 08/20/2017  . Acetaminophen poisoning [T39.1X1A] 08/20/2017  . Generalized anxiety disorder [F41.1] 01/05/2017  . Mood disorder (HCC) [F39] 01/05/2017   Total Time spent with patient: 20 minutes  Past Psychiatric History: schizophrenia  Past Medical History:  Past Medical History:  Diagnosis Date  . Anxiety   . Arthritis   . Bipolar disorder (HCC)   . Depression   . Major depressive disorder   . Panic disorder   . Schizophrenia (HCC)    History reviewed. No pertinent surgical history. Family History:  Family History  Problem Relation Age of Onset  . Post-traumatic stress disorder Mother   . Depression Mother   . Depression Maternal Uncle   . Cancer Maternal Grandmother    Family Psychiatric  History: none Social History:  Social History   Substance and Sexual Activity  Alcohol Use No  . Frequency: Never     Social History   Substance and Sexual Activity  Drug Use No  . Types: Marijuana    Social History   Socioeconomic History  . Marital status: Single    Spouse name: Not on file  . Number of children: 0  . Years of education: Not on file  . Highest education level: High school graduate  Occupational History    Comment: unemployed  Social Needs  . Financial resource strain: Not on file  . Food insecurity:    Worry: Not on file    Inability: Not on file  . Transportation needs:    Medical: No    Non-medical: No  Tobacco Use  .  Smoking status: Current Every Day Smoker    Packs/day: 0.25    Types: Cigarettes  . Smokeless tobacco: Never Used  Substance and Sexual Activity  . Alcohol use: No    Frequency: Never  . Drug use: No    Types: Marijuana  . Sexual activity: Not Currently  Lifestyle  . Physical activity:    Days per week: 0 days    Minutes per session: 0 min  . Stress: To some extent  Relationships  . Social connections:    Talks on phone: Not on file    Gets together: Not on file    Attends religious service: Never    Active member of club or organization: No    Attends meetings of clubs or organizations: Never    Relationship status: Never married  Other Topics Concern  . Not on file  Social History Narrative   ** Merged History Encounter **       Additional Social History:  Specify valuables returned: See valuable form                      Sleep: Fair  Appetite:  Good  Current Medications: Current Facility-Administered Medications  Medication Dose Route Frequency Provider Last Rate Last Dose  . acetaminophen (TYLENOL) tablet 650 mg  650 mg Oral Q6H PRN [name], [name], MD   650 mg at 06/02/18 2226  . alum & mag hydroxide-simeth (  MAALOX/MYLANTA) 200-200-20 MG/5ML suspension 30 mL  30 mL Oral Q4H PRN [name], [name], MD      . clonazePAM Scarlette Calico) tablet 0.5 mg  0.5 mg Oral BID PRN Darliss Ridgel, MD   0.5 mg at 06/03/18 1833  . cloZAPine (CLOZARIL) tablet 400 mg  400 mg Oral QHS Kattleya Kuhnert B, MD   400 mg at 06/02/18 2103  . diphenhydrAMINE (BENADRYL) capsule 50 mg  50 mg Oral QHS PRN Araminta Zorn B, MD   50 mg at 06/01/18 1456   Or  . diphenhydrAMINE (BENADRYL) injection 50 mg  50 mg Intramuscular Q6H PRN Mercades Bajaj B, MD   50 mg at 06/01/18 2214  . docusate sodium (COLACE) capsule 100 mg  100 mg Oral Daily Schneider Warchol B, MD   100 mg at 06/03/18 1303  . feeding supplement (ENSURE ENLIVE) (ENSURE ENLIVE) liquid 237 mL  237 mL Oral TID BM  Beverly Sessions, MD   237 mL at 06/03/18 1500  . gabapentin (NEURONTIN) capsule 300 mg  300 mg Oral TID Dayson Aboud B, MD   300 mg at 06/03/18 1638  . haloperidol (HALDOL) tablet 10 mg  10 mg Oral Q6H PRN Linlee Cromie B, MD   10 mg at 06/01/18 1456   Or  . haloperidol lactate (HALDOL) injection 10 mg  10 mg Intramuscular Q6H PRN Paul Torpey B, MD   10 mg at 06/01/18 2209  . lithium carbonate capsule 750 mg  750 mg Oral BID WC Alassane Kalafut B, MD   750 mg at 06/03/18 1638  . LORazepam (ATIVAN) tablet 2 mg  2 mg Oral Q6H PRN Ikram Riebe B, MD   2 mg at 06/01/18 1456   Or  . LORazepam (ATIVAN) injection 2 mg  2 mg Intramuscular Q6H PRN Shundra Wirsing B, MD   2 mg at 06/01/18 2209  . magnesium hydroxide (MILK OF MAGNESIA) suspension 30 mL  30 mL Oral Daily PRN [name], [name], MD      . metFORMIN (GLUCOPHAGE) tablet 500 mg  500 mg Oral BID WC Adrian Dinovo B, MD   500 mg at 06/03/18 1638  . metoprolol tartrate (LOPRESSOR) tablet 25 mg  25 mg Oral BID Clydell Sposito B, MD   25 mg at 06/03/18 1638  . nicotine polacrilex (NICORETTE) gum 2 mg  2 mg Oral PRN Rayford Williamsen B, MD   2 mg at 06/02/18 1704  . paliperidone (INVEGA SUSTENNA) injection 234 mg  234 mg Intramuscular Q28 days Maurilio Puryear B, MD   234 mg at 05/12/18 1737    Lab Results: No results found for this or any previous visit (from the past 48 hour(s)).  Blood Alcohol level:  Lab Results  Component Value Date   ETH <10 05/05/2018   ETH <10 10/12/2017    Metabolic Disorder Labs: Lab Results  Component Value Date   HGBA1C 5.1 05/05/2018   MPG 99.67 05/05/2018   Lab Results  Component Value Date   PROLACTIN 132.8 (H) 10/25/2017   Lab Results  Component Value Date   CHOL 190 05/07/2018   TRIG 112 05/07/2018   HDL 44 05/07/2018   CHOLHDL 4.3 05/07/2018   VLDL 22 05/07/2018   LDLCALC 124 (H) 05/07/2018   LDLCALC 72 10/25/2017    Physical Findings: AIMS:  Facial and Oral Movements Muscles of Facial Expression: None, normal Lips and Perioral Area: None, normal Jaw: None, normal Tongue: None, normal,Extremity Movements Upper (arms, wrists, hands, fingers): None, normal Lower (legs,  knees, ankles, toes): None, normal, Trunk Movements Neck, shoulders, hips: None, normal, Overall Severity Severity of abnormal movements (highest score from questions above): None, normal Incapacitation due to abnormal movements: None, normal Patient's awareness of abnormal movements (rate only patient's report): No Awareness, Dental Status Current problems with teeth and/or dentures?: No Does patient usually wear dentures?: No  CIWA:    COWS:     Musculoskeletal: Strength & Muscle Tone: within normal limits Gait & Station: normal Patient leans: N/A  Psychiatric Specialty Exam: Physical Exam  Nursing note and vitals reviewed. Psychiatric: Her affect is blunt. Her speech is delayed. She is slowed, withdrawn and actively hallucinating. Thought content is paranoid and delusional. Cognition and memory are impaired. She expresses impulsivity.    Review of Systems  Neurological: Negative.   Psychiatric/Behavioral: Positive for hallucinations and substance abuse.  All other systems reviewed and are negative.   Blood pressure 103/71, pulse 97, temperature 98.2 F (36.8 C), temperature source Oral, resp. rate 18, height 5\' 2"  (1.575 m), weight 63.5 kg, SpO2 100 %.Body mass index is 25.61 kg/m.  General Appearance: Disheveled  Eye Contact:  Fair  Speech:  Slow  Volume:  Decreased  Mood:  Depressed  Affect:  Flat  Thought Process:  Disorganized and Descriptions of Associations: Tangential  Orientation:  Full (Time, Place, and Person)  Thought Content:  Delusions, Hallucinations: Auditory and Paranoid Ideation  Suicidal Thoughts:  No  Homicidal Thoughts:  No  Memory:  Immediate;   Poor Recent;   Poor Remote;   Poor  Judgement:  Poor  Insight:  Lacking   Psychomotor Activity:  Psychomotor Retardation  Concentration:  Concentration: Poor and Attention Span: Poor  Recall:  Poor  Fund of Knowledge:  Poor  Language:  Poor  Akathisia:  No  Handed:  Right  AIMS (if indicated):     Assets:  Desire for Improvement Financial Resources/Insurance Physical Health Resilience  ADL's:  Intact  Cognition:  WNL  Sleep:  Number of Hours: 7     Treatment Plan Summary: Daily contact with patient to assess and evaluate symptoms and progress in treatment and Medication management   Ms. Kirks is a 22 year old female with a history of schizoaffective disorder admitted for auditory hallucinations and paranoia in the context of medication noncompliance.She was started on medications and tolerated them well but her progress was very slow. Clozapine was added to her regimen with improvement. At te time of discharge, the patient is not suicidal or homicidal but still mildly psychotic.She is still paranoid and has persisting auditory hallucinations at baseline.  There will be a guardianship hearing and the patient is going to be served cort papers.  #Schizoaffective disorder,improved -continueLithium 750 mg twice daily,Lithium level1.05 on 8/20 -continuemonthly Invega sustenna 234 mg injections,next dose 06/08/2018 -continueClozapine400mg  nightly, level657 on 8/20 -if possible, the patient should be taking Fazaclo 400 mg nightly to improve compliance at home -continueMetformin 500 mg BID for metabolic syndrome prevention  #Anxiety -continueClonazepam to 0.5 mg BID PRN  #Tachycardia -continue Metoprolol 25 mg BID  #Cannabis use disorder -She minimizes the use of marijuana and is not interested in any substance abuse treatment -She was advised to abstain from marijuana and all illicit drugs as they may worsen psychosis  #Smoking cessation -nicorette gumwasavailable  #Labs -lipid panel, TSH, A1Care normal -EKGreviewed, NSR  with QTc of 420 -pregnancy test is negative  #Disposition -discharge to home with her parents, family meeting completed -we support guardianship and SSD applications  -she will follow up with  [name] team -90 days ofinvoluntaryoutpatient commitment  Kristine LineaJolanta Roth Ress, MD 06/03/2018, 8:25 PM

## 2018-06-03 NOTE — [facility] Group Notes (Signed)
[facility] Group Notes:  (Nursing/MHT/Case Management/Adjunct)  Date:  06/03/2018  Time:  11:24 PM  Type of Therapy:  Group Therapy  Participation Level:  Active  Participation Quality:  Appropriate  Affect:  Appropriate  Cognitive:  Appropriate  Insight:  Appropriate  Engagement in Group:  Engaged  Modes of Intervention:  Discussion  Summary of Progress/Problems:  Jinger NeighborsKeith D Wayne Wicklund 06/03/2018, 11:24 PM

## 2018-06-03 NOTE — Plan of Care (Signed)
Patient verbalizes understanding of the general information that's been provided to her as well as her prescribed therapeutic regimen and all questions/concerns have been addressed and answered at this time. Patient denies SI/HI/AVH as well as depression and anxiety at this time. Patient had no stated goals for today and she has remained free from injury thus far on the unit. Patient has the ability to utilize techniques to improve his thought processes and she has shown some interest in leisure activities. Patient continues to sleep through majority of the morning, but has been present in the milieu during the afternoon. Patient has the ability to make decisions regarding her health-care and has been in compliance with her therapeutic regimen after refusing two times this morning. Patient has demonstrated positive behaviors and remains safe on the unit at this time.  Problem: Education: Goal: Knowledge of [facility] Education information/materials will improve Outcome: Progressing Goal: Mental status will improve Outcome: Progressing   Problem: Coping: Goal: Coping ability will improve Outcome: Progressing Goal: Will verbalize feelings Outcome: Progressing   Problem: Safety: Goal: Ability to remain free from injury will improve Outcome: Progressing   Problem: Self-Concept: Goal: Will verbalize positive feelings about self Outcome: Progressing   Problem: Education: Goal: Utilization of techniques to improve thought processes will improve Outcome: Progressing Goal: Knowledge of the prescribed therapeutic regimen will improve Outcome: Progressing   Problem: Activity: Goal: Interest or engagement in leisure activities will improve Outcome: Progressing Goal: Imbalance in normal sleep/wake cycle will improve Outcome: Progressing   Problem: Coping: Goal: Coping ability will improve Outcome: Progressing Goal: Will verbalize feelings Outcome: Progressing   Problem: Health  Behavior/Discharge Planning: Goal: Ability to make decisions will improve Outcome: Progressing Goal: Compliance with therapeutic regimen will improve Outcome: Progressing   Problem: Role Relationship: Goal: Will demonstrate positive changes in social behaviors and relationships Outcome: Progressing   Problem: Safety: Goal: Ability to disclose and discuss suicidal ideas will improve Outcome: Progressing Goal: Ability to identify and utilize support systems that promote safety will improve Outcome: Progressing   Problem: Self-Concept: Goal: Will verbalize positive feelings about self Outcome: Progressing Goal: Level of anxiety will decrease Outcome: Progressing   Problem: Education: Goal: Knowledge of General Education information will improve Description Including pain rating scale, medication(s)/side effects and non-pharmacologic comfort measures  Outcome: Progressing

## 2018-06-03 NOTE — Plan of Care (Signed)
Frustrated, disappointed about her disposition; needy, entitled; attention and medication seeking, no angry or irritable mood/affect; still hoarding snacks and cups full of water in her room. Encouraged to tidy up her room. C/O 7/10 Backache, Tylenol 650 mg PO PRN given.  Patient slept for Estimated Hours of 7.0; Precautionary checks every 15 minutes for safety maintained, room free of safety hazards, patient sustains no injury or falls during this shift.  Problem: Coping: Goal: Coping ability will improve Outcome: Progressing Goal: Will verbalize feelings Outcome: Progressing   Problem: Safety: Goal: Ability to remain free from injury will improve Outcome: Progressing   Problem: Education: Goal: Knowledge of the prescribed therapeutic regimen will improve Outcome: Progressing

## 2018-06-03 NOTE — Progress Notes (Signed)
Patient states to this writer "I refuse to take them all". MD notified.

## 2018-06-04 NOTE — Progress Notes (Signed)
Received Kimberly Boyle this AM in bed, she missed breakfast, but was OOB for lunch. She was compliant with her medications. She continues to deny all of the psychiatric symptoms. Her affect fluctuates at intervals from flat to a small smile.No change in her status this PM.

## 2018-06-04 NOTE — [facility] Group Notes (Signed)
[facility] LCSW Group Therapy  06/04/2018 1 pm  Group Therapy  Participation Level:  Did Not Attend   Giorgi Debruin LCSW [phone]  

## 2018-06-04 NOTE — Plan of Care (Signed)
Patient has improved in all areas, communicating needs and wants, and attending groups with peers occasionally, sits in the milieu to socialize with peers. Patient is aware of her coping skills, and maintaining safety in the unit no distress, sleep long hours, denies any SI/HI/AVH, 15 minute safety rounding is maintained.    Problem: Education: Goal: Knowledge of [facility] Education information/materials will improve Outcome: Progressing Goal: Mental status will improve Outcome: Progressing   Problem: Coping: Goal: Coping ability will improve Outcome: Progressing Goal: Will verbalize feelings Outcome: Progressing   Problem: Safety: Goal: Ability to remain free from injury will improve Outcome: Progressing   Problem: Self-Concept: Goal: Will verbalize positive feelings about self Outcome: Progressing   Problem: Education: Goal: Utilization of techniques to improve thought processes will improve Outcome: Progressing Goal: Knowledge of the prescribed therapeutic regimen will improve Outcome: Progressing   Problem: Activity: Goal: Interest or engagement in leisure activities will improve Outcome: Progressing Goal: Imbalance in normal sleep/wake cycle will improve Outcome: Progressing   Problem: Coping: Goal: Coping ability will improve Outcome: Progressing Goal: Will verbalize feelings Outcome: Progressing   Problem: Health Behavior/Discharge Planning: Goal: Ability to make decisions will improve Outcome: Progressing Goal: Compliance with therapeutic regimen will improve Outcome: Progressing   Problem: Role Relationship: Goal: Will demonstrate positive changes in social behaviors and relationships Outcome: Progressing   Problem: Safety: Goal: Ability to disclose and discuss suicidal ideas will improve Outcome: Progressing Goal: Ability to identify and utilize support systems that promote safety will improve Outcome: Progressing   Problem:  Self-Concept: Goal: Will verbalize positive feelings about self Outcome: Progressing Goal: Level of anxiety will decrease Outcome: Progressing   Problem: Education: Goal: Knowledge of General Education information will improve Description Including pain rating scale, medication(s)/side effects and non-pharmacologic comfort measures  Outcome: Progressing
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
## 2018-06-05 NOTE — [facility] Group Notes (Signed)
[facility] Group Notes:  (Nursing/MHT/Case Management/Adjunct)  Date:  06/05/2018  Time:  9:38 PM  Type of Therapy:  Group Therapy  Participation Level:  Active  Participation Quality:  Appropriate  Affect:  Flat  Cognitive:  Lacking  Insight:  Good  Engagement in Group:  Engaged  Modes of Intervention:  Support  Summary of Progress/Problems:  Kimberly Boyle 06/05/2018, 9:38 PM

## 2018-06-05 NOTE — [facility] Group Notes (Signed)
[facility] LCSW Group Therapy  8/252019 1 pm  Type of Therapy:  Group Therapy:Open discussion and Promoting self care  Participation Level:  Did not attend  Conna Terada LCSW [phone] 

## 2018-06-05 NOTE — Progress Notes (Addendum)
[facility]  06/06/2018 7:45 PM Kimberly Boyle  MRN:  [id number]  Subjective:   Ms. Kimberly Boyle seams much better today. She is awake, better groomed and able to effectively participate in the interview. She tells me that her stepfather is now ready to accept her back home. She has no complaints, tolerates medications well. She is aware of court date on 9/10 for guardianship hearing.  Spoke with her mother. The patient is not allowed to return home unconditionally. Family requests a family meeting. The mother has not been able to contact [name] or [name]. She did not start disability application. Kimberly Boyle has been trying to get family phone numbers from her to worn them that they are in danger.  Principal Problem: Schizoaffective disorder, bipolar type (HCC) Diagnosis:   Patient Active Problem List   Diagnosis Date Noted  . Schizoaffective disorder, bipolar type (HCC) [F25.0] 05/06/2018    Priority: High  . Tobacco use disorder [F17.200] 05/08/2018  . Suicide attempt (HCC) [T14.91XA] 08/20/2017  . Cannabis use disorder, moderate, dependence (HCC) [F12.20] 08/20/2017  . Self-inflicted laceration of wrist [S61.519A] 08/20/2017  . Acetaminophen poisoning [T39.1X1A] 08/20/2017  . Generalized anxiety disorder [F41.1] 01/05/2017  . Mood disorder (HCC) [F39] 01/05/2017   Total Time spent with patient: 20 minutes  Past Psychiatric History: schizophrenia  Past Medical History:  Past Medical History:  Diagnosis Date  . Anxiety   . Arthritis   . Bipolar disorder (HCC)   . Depression   . Major depressive disorder   . Panic disorder   . Schizophrenia (HCC)    History reviewed. No pertinent surgical history. Family History:  Family History  Problem Relation Age of Onset  . Post-traumatic stress disorder Mother   . Depression Mother   . Depression Maternal Uncle   . Cancer Maternal Grandmother    Family Psychiatric  History: none Social History:  Social History    Substance and Sexual Activity  Alcohol Use No  . Frequency: Never     Social History   Substance and Sexual Activity  Drug Use No  . Types: Marijuana    Social History   Socioeconomic History  . Marital status: Single    Spouse name: Not on file  . Number of children: 0  . Years of education: Not on file  . Highest education level: High school graduate  Occupational History    Comment: unemployed  Social Needs  . Financial resource strain: Not on file  . Food insecurity:    Worry: Not on file    Inability: Not on file  . Transportation needs:    Medical: No    Non-medical: No  Tobacco Use  . Smoking status: Current Every Day Smoker    Packs/day: 0.25    Types: Cigarettes  . Smokeless tobacco: Never Used  Substance and Sexual Activity  . Alcohol use: No    Frequency: Never  . Drug use: No    Types: Marijuana  . Sexual activity: Not Currently  Lifestyle  . Physical activity:    Days per week: 0 days    Minutes per session: 0 min  . Stress: To some extent  Relationships  . Social connections:    Talks on phone: Not on file    Gets together: Not on file    Attends religious service: Never    Active member of club or organization: No    Attends meetings of clubs or organizations: Never    Relationship status: Never married  Other Topics Concern  . Not on file  Social History Narrative   ** Merged History Encounter **       Additional Social History:  Specify valuables returned: See valuable form                      Sleep: Fair  Appetite:  Fair  Current Medications: Current Facility-Administered Medications  Medication Dose Route Frequency Provider Last Rate Last Dose  . acetaminophen (TYLENOL) tablet 650 mg  650 mg Oral Q6H PRN [name], [name], MD   650 mg at 06/02/18 2226  . alum & mag hydroxide-simeth (MAALOX/MYLANTA) 200-200-20 MG/5ML suspension 30 mL  30 mL Oral Q4H PRN [name], [name], MD      . cloZAPine (CLOZARIL) tablet 400 mg   400 mg Oral QHS Leelyn Jasinski B, MD   400 mg at 06/05/18 2101  . docusate sodium (COLACE) capsule 100 mg  100 mg Oral Daily Adriaan Maltese B, MD   100 mg at 06/06/18 1114  . gabapentin (NEURONTIN) capsule 300 mg  300 mg Oral TID Jory Welke B, MD   300 mg at 06/06/18 1726  . lithium carbonate capsule 750 mg  750 mg Oral BID WC Pancho Rushing B, MD   750 mg at 06/06/18 1725  . magnesium hydroxide (MILK OF MAGNESIA) suspension 30 mL  30 mL Oral Daily PRN [name], [name], MD      . metFORMIN (GLUCOPHAGE) tablet 500 mg  500 mg Oral BID WC Lilliam Chamblee B, MD   500 mg at 06/06/18 1726  . metoprolol tartrate (LOPRESSOR) tablet 25 mg  25 mg Oral BID Noami Bove B, MD   25 mg at 06/06/18 1729  . nicotine polacrilex (NICORETTE) gum 2 mg  2 mg Oral PRN Geoffry Bannister B, MD   2 mg at 06/05/18 2016  . paliperidone (INVEGA SUSTENNA) injection 234 mg  234 mg Intramuscular Q28 days Ted Goodner B, MD   234 mg at 05/12/18 1737    Lab Results: No results found for this or any previous visit (from the past 48 hour(s)).  Blood Alcohol level:  Lab Results  Component Value Date   ETH <10 05/05/2018   ETH <10 10/12/2017    Metabolic Disorder Labs: Lab Results  Component Value Date   HGBA1C 5.1 05/05/2018   MPG 99.67 05/05/2018   Lab Results  Component Value Date   PROLACTIN 132.8 (H) 10/25/2017   Lab Results  Component Value Date   CHOL 190 05/07/2018   TRIG 112 05/07/2018   HDL 44 05/07/2018   CHOLHDL 4.3 05/07/2018   VLDL 22 05/07/2018   LDLCALC 124 (H) 05/07/2018   LDLCALC 72 10/25/2017    Physical Findings: AIMS: Facial and Oral Movements Muscles of Facial Expression: None, normal Lips and Perioral Area: None, normal Jaw: None, normal Tongue: None, normal,Extremity Movements Upper (arms, wrists, hands, fingers): None, normal Lower (legs, knees, ankles, toes): None, normal, Trunk Movements Neck, shoulders, hips: None, normal, Overall  Severity Severity of abnormal movements (highest score from questions above): None, normal Incapacitation due to abnormal movements: None, normal Patient's awareness of abnormal movements (rate only patient's report): No Awareness, Dental Status Current problems with teeth and/or dentures?: No Does patient usually wear dentures?: No  CIWA:    COWS:     Musculoskeletal: Strength & Muscle Tone: within normal limits Gait & Station: normal Patient leans: N/A  Psychiatric Specialty Exam: Physical Exam  Nursing note and vitals reviewed. Psychiatric: Her  speech is normal. Her affect is blunt. She is slowed, withdrawn and actively hallucinating. Thought content is paranoid. Cognition and memory are impaired. She expresses impulsivity.    Review of Systems  Neurological: Negative.   Psychiatric/Behavioral: Positive for hallucinations.  All other systems reviewed and are negative.   Blood pressure 113/77, pulse 100, temperature 98.3 F (36.8 C), temperature source Oral, resp. rate 16, height 5\' 2"  (1.575 m), weight 63.5 kg, SpO2 97 %.Body mass index is 25.61 kg/m.  General Appearance: Casual  Eye Contact:  Good  Speech:  Slow  Volume:  Normal  Mood:  Euthymic  Affect:  Blunt  Thought Process:  Goal Directed and Descriptions of Associations: Intact  Orientation:  Full (Time, Place, and Person)  Thought Content:  WDL  Suicidal Thoughts:  No  Homicidal Thoughts:  No  Memory:  Immediate;   Fair Recent;   Fair Remote;   Fair  Judgement:  Poor  Insight:  Lacking  Psychomotor Activity:  Psychomotor Retardation  Concentration:  Concentration: Fair and Attention Span: Fair  Recall:  [employer] of Knowledge:  Fair  Language:  Fair  Akathisia:  No  Handed:  Right  AIMS (if indicated):     Assets:  Communication Skills Desire for Improvement Financial Resources/Insurance Physical Health Resilience Social Support  ADL's:  Intact  Cognition:  WNL  Sleep:  Number of Hours: 6.15      Treatment Plan Summary: Daily contact with patient to assess and evaluate symptoms and progress in treatment and Medication management   Ms. Seyer is a 22 year old female with a history of schizoaffective disorder admitted for auditory hallucinations and paranoia in the context of medication noncompliance.She was started on medications and tolerated them well but her progress was very slow. Clozapine was added to her regimen with improvement. At te time of discharge, the patient is not suicidal or homicidal but still mildly psychotic.She is still paranoid and has persisting auditory hallucinations at baseline.  There will be a guardianship hearing and the patient is going to be served cort papers.  #Schizoaffective disorder,improved -continueLithium 750 mg twice daily,Lithium level1.05 on 8/20 -continuemonthly Invega sustenna 234 mg injections,next dose 06/08/2018 -continueClozapine400mg  nightly, level657 on 8/20 -if possible, the patient should be taking Fazaclo 400 mg nightly to improve complianceat home -continueMetformin 500 mg BID for metabolic syndrome prevention  #Anxiety -discontinueClonazepam to 0.5 mg BID PRNdue to slowness  #Increased appetite -discontinue Ensure  #Tachycardia -continue Metoprolol 25 mg BID  #Cannabis use disorder -She minimizes the use of marijuana and is not interested in any substance abuse treatment -She was advised to abstain from marijuana and all illicit drugs as they may worsen psychosis  #Smoking cessation -nicorette gumwasavailable  #Labs -lipid panel, TSH, A[facility]Care normal -EKGreviewed, NSR with QTc of 420 -pregnancy test is negative  #Disposition -discharge to home with her parents, family meeting completed -we support guardianship and SSD applications  -she will follow up with [name] team -90 days ofinvoluntaryoutpatient commitment  Kristine Linea, MD 06/06/2018, 7:45 PM

## 2018-06-05 NOTE — Progress Notes (Addendum)
Received Kimberly Boyle this AM in her room, she was compliant with her medications. She slept in late and missed breakfast, but was OOB for lunch and dinner. She continues to be  challenged with her personal hygiene. She requested medication for anxiety, but was redirected to join her peers in the courtyard.  After the visit with her mom, she requested medication for anxiety because she believed someone is trying to kill her mother. She was given nicotine gum and provided emotional reassurance.

## 2018-06-05 NOTE — Plan of Care (Signed)
Alert and oriented, thought process organized. Compliant with medications

## 2018-06-05 NOTE — Plan of Care (Signed)
Pleasant and cooperative.

## 2018-06-05 NOTE — Progress Notes (Signed)
[facility]  06/05/2018 12:08 AM Kimberly Boyle  MRN:  [id number]  Subjective:   Kimberly Boyle is in bed this morning again but not asleep. She has no complaints except for "anxiety" and wants her Clonazepam back. She does not appear anxious. Tio the contrary, she is too sedated. She has been very needy approaching nursing station on multiple occasions. We discontinued her PRNs for that.   Court papers for guardianship hearing have been served but the patient did not mentioned it at all.  Spoke with the mother who [facility] been [facility] [facility].  Principal Problem: Schizoaffective disorder, bipolar type (HCC) Diagnosis:   Patient Active Problem List   Diagnosis Date Noted  . Schizoaffective disorder, bipolar type (HCC) [F25.0] 05/06/2018    Priority: High  . Tobacco use disorder [F17.200] 05/08/2018  . Suicide attempt (HCC) [T14.91XA] 08/20/2017  . Cannabis use disorder, moderate, dependence (HCC) [F12.20] 08/20/2017  . Self-inflicted laceration of wrist [S61.519A] 08/20/2017  . Acetaminophen poisoning [T39.1X1A] 08/20/2017  . Generalized anxiety disorder [F41.1] 01/05/2017  . Mood disorder (HCC) [F39] 01/05/2017   Total Time spent with patient: 20 minutes  Past Psychiatric History: schizophrenia  Past Medical History:  Past Medical History:  Diagnosis Date  . Anxiety   . Arthritis   . Bipolar disorder (HCC)   . Depression   . Major depressive disorder   . Panic disorder   . Schizophrenia (HCC)    History reviewed. No pertinent surgical history. Family History:  Family History  Problem Relation Age of Onset  . Post-traumatic stress disorder Mother   . Depression Mother   . Depression Maternal Uncle   . Cancer Maternal Grandmother    Family Psychiatric  History: none Social History:  Social History   Substance and Sexual Activity  Alcohol Use No  . Frequency: Never     Social History   Substance and Sexual Activity  Drug Use No  .  Types: Marijuana    Social History   Socioeconomic History  . Marital status: Single    Spouse name: Not on file  . Number of children: 0  . Years of education: Not on file  . Highest education level: High school graduate  Occupational History    Comment: unemployed  Social Needs  . Financial resource strain: Not on file  . Food insecurity:    Worry: Not on file    Inability: Not on file  . Transportation needs:    Medical: No    Non-medical: No  Tobacco Use  . Smoking status: Current Every Day Smoker    Packs/day: 0.25    Types: Cigarettes  . Smokeless tobacco: Never Used  Substance and Sexual Activity  . Alcohol use: No    Frequency: Never  . Drug use: No    Types: Marijuana  . Sexual activity: Not Currently  Lifestyle  . Physical activity:    Days per week: 0 days    Minutes per session: 0 min  . Stress: To some extent  Relationships  . Social connections:    Talks on phone: Not on file    Gets together: Not on file    Attends religious service: Never    Active member of club or organization: No    Attends meetings of clubs or organizations: Never    Relationship status: Never married  Other Topics Concern  . Not on file  Social History Narrative   ** Merged History Encounter **  Additional Social History:  Specify valuables returned: See valuable form                      Sleep: Fair  Appetite:  Fair  Current Medications: Current Facility-Administered Medications  Medication Dose Route Frequency Provider Last Rate Last Dose  . acetaminophen (TYLENOL) tablet 650 mg  650 mg Oral Q6H PRN [name], [name], MD   650 mg at 06/02/18 2226  . alum & mag hydroxide-simeth (MAALOX/MYLANTA) 200-200-20 MG/5ML suspension 30 mL  30 mL Oral Q4H PRN [name], [name], MD      . cloZAPine (CLOZARIL) tablet 400 mg  400 mg Oral QHS Mendell Bontempo B, MD   400 mg at 06/04/18 2134  . diphenhydrAMINE (BENADRYL) capsule 50 mg  50 mg Oral QHS PRN Delsie Amador,  Chalon Zobrist B, MD   50 mg at 06/01/18 1456   Or  . diphenhydrAMINE (BENADRYL) injection 50 mg  50 mg Intramuscular Q6H PRN Garner Dullea B, MD   50 mg at 06/01/18 2214  . docusate sodium (COLACE) capsule 100 mg  100 mg Oral Daily Brittinie Wherley B, MD   100 mg at 06/04/18 0913  . gabapentin (NEURONTIN) capsule 300 mg  300 mg Oral TID Turner Baillie B, MD   300 mg at 06/04/18 1644  . haloperidol (HALDOL) tablet 10 mg  10 mg Oral Q6H PRN Landis Cassaro B, MD   10 mg at 06/01/18 1456   Or  . haloperidol lactate (HALDOL) injection 10 mg  10 mg Intramuscular Q6H PRN Riah Kehoe B, MD   10 mg at 06/01/18 2209  . lithium carbonate capsule 750 mg  750 mg Oral BID WC Verdia Bolt B, MD   750 mg at 06/04/18 1643  . LORazepam (ATIVAN) tablet 2 mg  2 mg Oral Q6H PRN Ryley Bachtel B, MD   2 mg at 06/03/18 2103   Or  . LORazepam (ATIVAN) injection 2 mg  2 mg Intramuscular Q6H PRN Chaz Mcglasson B, MD   2 mg at 06/01/18 2209  . magnesium hydroxide (MILK OF MAGNESIA) suspension 30 mL  30 mL Oral Daily PRN [name], [name], MD      . metFORMIN (GLUCOPHAGE) tablet 500 mg  500 mg Oral BID WC Magin Balbi B, MD   500 mg at 06/04/18 1643  . metoprolol tartrate (LOPRESSOR) tablet 25 mg  25 mg Oral BID Ulysee Fyock B, MD   25 mg at 06/04/18 1643  . nicotine polacrilex (NICORETTE) gum 2 mg  2 mg Oral PRN Rawlin Reaume B, MD   2 mg at 06/04/18 1642  . paliperidone (INVEGA SUSTENNA) injection 234 mg  234 mg Intramuscular Q28 days Mkenzie Dotts B, MD   234 mg at 05/12/18 1737    Lab Results: No results found for this or any previous visit (from the past 48 hour(s)).  Blood Alcohol level:  Lab Results  Component Value Date   ETH <10 05/05/2018   ETH <10 10/12/2017    Metabolic Disorder Labs: Lab Results  Component Value Date   HGBA1C 5.1 05/05/2018   MPG 99.67 05/05/2018   Lab Results  Component Value Date   PROLACTIN 132.8 (H) 10/25/2017    Lab Results  Component Value Date   CHOL 190 05/07/2018   TRIG 112 05/07/2018   HDL 44 05/07/2018   CHOLHDL 4.3 05/07/2018   VLDL 22 05/07/2018   LDLCALC 124 (H) 05/07/2018   LDLCALC 72 10/25/2017    Physical Findings: AIMS:  Facial and Oral Movements Muscles of Facial Expression: None, normal Lips and Perioral Area: None, normal Jaw: None, normal Tongue: None, normal,Extremity Movements Upper (arms, wrists, hands, fingers): None, normal Lower (legs, knees, ankles, toes): None, normal, Trunk Movements Neck, shoulders, hips: None, normal, Overall Severity Severity of abnormal movements (highest score from questions above): None, normal Incapacitation due to abnormal movements: None, normal Patient's awareness of abnormal movements (rate only patient's report): No Awareness, Dental Status Current problems with teeth and/or dentures?: No Does patient usually wear dentures?: No  CIWA:    COWS:     Musculoskeletal: Strength & Muscle Tone: within normal limits Gait & Station: normal Patient leans: N/A  Psychiatric Specialty Exam: Physical Exam  Nursing note and vitals reviewed. Psychiatric: Her affect is blunt. Her speech is delayed and tangential. She is withdrawn and actively hallucinating. Thought content is paranoid. Cognition and memory are impaired. She expresses impulsivity.    Review of Systems  Neurological: Negative.   Psychiatric/Behavioral: Positive for hallucinations.  All other systems reviewed and are negative.   Blood pressure 105/70, pulse 96, temperature 98.2 F (36.8 C), temperature source Oral, resp. rate 16, height 5\' 2"  (1.575 m), weight 63.5 kg, SpO2 98 %.Body mass index is 25.61 kg/m.  General Appearance: Disheveled  Eye Contact:  Fair  Speech:  Clear and Coherent  Volume:  Normal  Mood:  Euthymic  Affect:  Flat  Thought Process:  Irrelevant and Descriptions of Associations: Loose  Orientation:  Full (Time, Place, and Person)  Thought  Content:  Delusions, Hallucinations: Auditory and Paranoid Ideation  Suicidal Thoughts:  No  Homicidal Thoughts:  No  Memory:  Immediate;   Fair Recent;   Fair Remote;   Fair  Judgement:  Poor  Insight:  Lacking  Psychomotor Activity:  Psychomotor Retardation  Concentration:  Concentration: Fair and Attention Span: Fair  Recall:  [employer] of Knowledge:  Fair  Language:  Fair  Akathisia:  No  Handed:  Right  AIMS (if indicated):     Assets:  Communication Skills Desire for Improvement Financial Resources/Insurance Physical Health Resilience Social Support  ADL's:  Intact  Cognition:  WNL  Sleep:  Number of Hours: 7.15     Treatment Plan Summary: Daily contact with patient to assess and evaluate symptoms and progress in treatment and Medication management   Ms. Potteiger is a 22 year old female with a history of schizoaffective disorder admitted for auditory hallucinations and paranoia in the context of medication noncompliance.She was started on medications and tolerated them well but her progress was very slow. Clozapine was added to her regimen with improvement. At te time of discharge, the patient is not suicidal or homicidal but still mildly psychotic.She is still paranoid and has persisting auditory hallucinations at baseline.  There will be a guardianship hearing and the patient is going to be served cort papers.  #Schizoaffective disorder,improved -continueLithium 750 mg twice daily,Lithium level1.05 on 8/20 -continuemonthly Invega sustenna 234 mg injections,next dose 06/08/2018 -continueClozapine400mg  nightly, level657 on 8/20 -if possible, the patient should be taking Fazaclo 400 mg nightly to improve complianceat home -continueMetformin 500 mg BID for metabolic syndrome prevention  #Anxiety -discontinueClonazepam to 0.5 mg BID PRNdue to slowness  #Increased appetite -discontinue Ensure  #Tachycardia -continue Metoprolol 25 mg  BID  #Cannabis use disorder -She minimizes the use of marijuana and is not interested in any substance abuse treatment -She was advised to abstain from marijuana and all illicit drugs as they may worsen psychosis  #Smoking cessation -nicorette gumwasavailable  #  Labs -lipid panel, TSH, [facility]Care normal -EKGreviewed, NSR with QTc of 420 -pregnancy test is negative  #Disposition -discharge to home with her parents, family meeting completed -we support guardianship and SSD applications  -she will follow up with [name] team -90 days ofinvoluntaryoutpatient commitment  Kristine Linea, MD 06/05/2018, 12:08 AM

## 2018-06-05 NOTE — Progress Notes (Signed)
Patient stayed in her room for the majority of the shift. Did not program in HS group activities. Came to the nurses station frequently to request services. Pleasant and denying SI and hallucinations. Went to the dayroom as snack time. Presented to the medication room to have her HS medications. Did not have any concern. Currently in bed resting. Safety and security maintained.

## 2018-06-05 NOTE — Progress Notes (Signed)
Patient stayed in her room but was able to come out as needed. Mood improved. Alert and oriented. Pleasant and cooperative. Toward medication time, patient expressed anxiety and restlessness. Received Ativan 2mg  by mouth as needed. Received her bedtime medication as well then went to bed. Was encouraged to talk to staff as needed. Staff continue to monitor.

## 2018-06-06 NOTE — Progress Notes (Addendum)
[facility] MD Progress Note  06/07/2018 3:56 PM Kimberly Boyle  MRN:  [id number]  Subjective:   Still in bed at [facility] to make "time go faster". Drooling on her pillow. Still disorganized and paranoid believing that now people are trying to kill her mother and father. Family meeting tomorrow to discuss discharge options.   Principal Problem: Schizoaffective disorder, bipolar type (HCC) Diagnosis:   Patient Active Problem List   Diagnosis Date Noted  . Schizoaffective disorder, bipolar type (HCC) [F25.0] 05/06/2018    Priority: High  . Tobacco use disorder [F17.200] 05/08/2018  . Suicide attempt (HCC) [T14.91XA] 08/20/2017  . Cannabis use disorder, moderate, dependence (HCC) [F12.20] 08/20/2017  . Self-inflicted laceration of wrist [S61.519A] 08/20/2017  . Acetaminophen poisoning [T39.1X1A] 08/20/2017  . Generalized anxiety disorder [F41.1] 01/05/2017  . Mood disorder (HCC) [F39] 01/05/2017   Total Time spent with patient: 20 minutes  Past Psychiatric History: schizophrenia  Past Medical History:  Past Medical History:  Diagnosis Date  . Anxiety   . Arthritis   . Bipolar disorder (HCC)   . Depression   . Major depressive disorder   . Panic disorder   . Schizophrenia (HCC)    History reviewed. No pertinent surgical history. Family History:  Family History  Problem Relation Age of Onset  . Post-traumatic stress disorder Mother   . Depression Mother   . Depression Maternal Uncle   . Cancer Maternal Grandmother    Family Psychiatric  History: none Social History:  Social History   Substance and Sexual Activity  Alcohol Use No  . Frequency: Never     Social History   Substance and Sexual Activity  Drug Use No  . Types: Marijuana    Social History   Socioeconomic History  . Marital status: Single    Spouse name: Not on file  . Number of children: 0  . Years of education: Not on file  . Highest education level: High school graduate  Occupational History     Comment: unemployed  Social Needs  . Financial resource strain: Not on file  . Food insecurity:    Worry: Not on file    Inability: Not on file  . Transportation needs:    Medical: No    Non-medical: No  Tobacco Use  . Smoking status: Current Every Day Smoker    Packs/day: 0.25    Types: Cigarettes  . Smokeless tobacco: Never Used  Substance and Sexual Activity  . Alcohol use: No    Frequency: Never  . Drug use: No    Types: Marijuana  . Sexual activity: Not Currently  Lifestyle  . Physical activity:    Days per week: 0 days    Minutes per session: 0 min  . Stress: To some extent  Relationships  . Social connections:    Talks on phone: Not on file    Gets together: Not on file    Attends religious service: Never    Active member of club or organization: No    Attends meetings of clubs or organizations: Never    Relationship status: Never married  Other Topics Concern  . Not on file  Social History Narrative   ** Merged History Encounter **       Additional Social History:  Specify valuables returned: See valuable form                      Sleep: Fair  Appetite:  Fair  Current Medications: Current Facility-Administered Medications  Medication Dose Route Frequency Provider Last Rate Last Dose  . acetaminophen (TYLENOL) tablet 650 mg  650 mg Oral Q6H PRN [name], [name], MD   650 mg at 06/02/18 2226  . alum & mag hydroxide-simeth (MAALOX/MYLANTA) 200-200-20 MG/5ML suspension 30 mL  30 mL Oral Q4H PRN [name], [name], MD      . cloZAPine (CLOZARIL) tablet 400 mg  400 mg Oral QHS Shoua Ressler B, MD   400 mg at 06/06/18 2142  . docusate sodium (COLACE) capsule 100 mg  100 mg Oral Daily Rubby Barbary B, MD   100 mg at 06/07/18 0910  . gabapentin (NEURONTIN) capsule 300 mg  300 mg Oral TID Toini Failla B, MD   300 mg at 06/07/18 1146  . lithium carbonate capsule 750 mg  750 mg Oral BID WC Lemya Greenwell B, MD   750 mg at 06/07/18 0912   . magnesium hydroxide (MILK OF MAGNESIA) suspension 30 mL  30 mL Oral Daily PRN [name], [name], MD      . metFORMIN (GLUCOPHAGE) tablet 500 mg  500 mg Oral BID WC Alaysiah Browder B, MD   500 mg at 06/07/18 0910  . metoprolol tartrate (LOPRESSOR) tablet 25 mg  25 mg Oral BID Latoia Eyster B, MD   25 mg at 06/07/18 0911  . nicotine polacrilex (NICORETTE) gum 2 mg  2 mg Oral PRN Theresa Wedel B, MD   2 mg at 06/05/18 2016  . paliperidone (INVEGA SUSTENNA) injection 234 mg  234 mg Intramuscular Q28 days Dajuan Turnley B, MD   234 mg at 05/12/18 1737    Lab Results:  Results for orders placed or performed during the hospital encounter of 05/06/18 (from the past 48 hour(s))  CBC with Differential/Platelet     Status: Abnormal   Collection Time: 06/07/18  7:10 AM  Result Value Ref Range   WBC 12.1 (H) 3.6 - 11.0 K/uL   RBC 4.00 3.80 - 5.20 MIL/uL   Hemoglobin 12.6 12.0 - 16.0 g/dL   HCT 40.9 81.1 - 91.4 %   MCV 92.5 80.0 - 100.0 fL   MCH 31.6 26.0 - 34.0 pg   MCHC 34.2 32.0 - 36.0 g/dL   RDW 78.2 95.6 - 21.3 %   Platelets 352 150 - 440 K/uL   Neutrophils Relative % 66 %   Neutro Abs 8.0 (H) 1.4 - 6.5 K/uL   Lymphocytes Relative 19 %   Lymphs Abs 2.3 1.0 - 3.6 K/uL   Monocytes Relative 10 %   Monocytes Absolute 1.2 (H) 0.2 - 0.9 K/uL   Eosinophils Relative 4 %   Eosinophils Absolute 0.5 0 - 0.7 K/uL   Basophils Relative 1 %   Basophils Absolute 0.1 0 - 0.1 K/uL    Comment: Performed at [facility], [address] [place], [place] [address]    Blood Alcohol level:  Lab Results  Component Value Date   [facility] <10 05/05/2018   ETH <10 10/12/2017    Metabolic Disorder Labs: Lab Results  Component Value Date   HGBA1C 5.1 05/05/2018   MPG 99.67 05/05/2018   Lab Results  Component Value Date   PROLACTIN 132.8 (H) 10/25/2017   Lab Results  Component Value Date   CHOL 190 05/07/2018   TRIG 112 05/07/2018   HDL 44 05/07/2018   CHOLHDL 4.3  05/07/2018   VLDL 22 05/07/2018   LDLCALC 124 (H) 05/07/2018   LDLCALC 72 10/25/2017    Physical Findings: AIMS: Facial and  Oral Movements Muscles of Facial Expression: None, normal Lips and Perioral Area: None, normal Jaw: None, normal Tongue: None, normal,Extremity Movements Upper (arms, wrists, hands, fingers): None, normal Lower (legs, knees, ankles, toes): None, normal, Trunk Movements Neck, shoulders, hips: None, normal, Overall Severity Severity of abnormal movements (highest score from questions above): None, normal Incapacitation due to abnormal movements: None, normal Patient's awareness of abnormal movements (rate only patient's report): No Awareness, Dental Status Current problems with teeth and/or dentures?: No Does patient usually wear dentures?: No  CIWA:    COWS:     Musculoskeletal: Strength & Muscle Tone: within normal limits Gait & Station: normal Patient leans: N/A  Psychiatric Specialty Exam: Physical Exam  Nursing note and vitals reviewed. Psychiatric: Her speech is normal. Her affect is blunt. She is withdrawn and actively hallucinating. Thought content is paranoid. Cognition and memory are impaired. She expresses impulsivity.    Review of Systems  Neurological: Negative.   Psychiatric/Behavioral: Positive for hallucinations.  All other systems reviewed and are negative.   Blood pressure 108/75, pulse 91, temperature 97.6 F (36.4 C), temperature source Oral, resp. rate 16, height 5\' 2"  (1.575 m), weight 63.5 kg, SpO2 99 %.Body mass index is 25.61 kg/m.  General Appearance: Fairly Groomed  Eye Contact:  Good  Speech:  Slow  Volume:  Normal  Mood:  Euthymic  Affect:  Blunt  Thought Process:  Goal Directed and Descriptions of Associations: Intact  Orientation:  Full (Time, Place, and Person)  Thought Content:  Hallucinations: Auditory and Paranoid Ideation  Suicidal Thoughts:  No  Homicidal Thoughts:  No  Memory:  Immediate;   Fair Recent;    Fair Remote;   Fair  Judgement:  Poor  Insight:  Lacking  Psychomotor Activity:  Psychomotor Retardation  Concentration:  Concentration: Fair and Attention Span: Fair  Recall:  F[employer]Fair  Fund of Knowledge:  Fair  Language:  Fair  Akathisia:  No  Handed:  Right  AIMS (if indicated):     Assets:  Communication Skills Desire for Improvement Financial Resources/Insurance Physical Health Resilience Social Support  ADL's:  Intact  Cognition:  WNL  Sleep:  Number of Hours: 7.45     Treatment Plan Summary: Daily contact with patient to assess and evaluate symptoms and progress in treatment and Medication management   [name] is a 22 year old female with a history of schizoaffective disorder admitted for auditory hallucinations and paranoia in the context of medication noncompliance.She was started on medications and tolerated them well but her progress was very slow. Clozapine was added to her regimen with improvement. At te time of discharge, the patient is not suicidal or homicidal but still mildly psychotic.She is still paranoid and has persisting auditory hallucinations at baseline.  There will be a guardianship hearing and the patient is going to be served cort papers.  #Schizoaffective disorder,improved -continueLithium 750 mg twice daily,Lithium level1.05 on 8/20 -continuemonthly Invega sustenna 234 mg injections,next dose 06/08/2018 -continueClozapine400mg  nightly, level657 on 8/20 -if possible, the patient should be taking Fazaclo 400 mg nightly to improve complianceat home -continueMetformin 500 mg BID for metabolic syndrome prevention  #Anxiety -discontinueClonazepam to 0.5 mg BID PRNdue to slowness  #Increased appetite -discontinue Ensure  #Tachycardia -continue Metoprolol 25 mg BID  #Cannabis use disorder -She minimizes the use of marijuana and is not interested in any substance abuse treatment -She was advised to abstain from marijuana and  all illicit drugs as they may worsen psychosis  #Smoking cessation -nicorette gumwasavailable  #Labs -lipid panel, TSH,  A1Care normal -EKGreviewed, NSR with QTc of 420 -pregnancy test is negative  #Disposition -discharge to home with her parents, family meeting completed -we support guardianship and SSD applications  -she will follow up with [name] ACT team -90 days ofinvoluntaryoutpatient commitment  Kristine Linea, MD 06/07/2018, 3:56 PM

## 2018-06-06 NOTE — Progress Notes (Signed)
Patient ID: Kimberly GenerousSarah Belle Boyle, female   DOB: 04/21/1996, 22 y.o.   MRN: [id number] Attempts to Dorothe PeaKerah's place to clarify if beds available without any response   Will continue to follow up.  Jake SharkSara Eran Mistry, LCSW

## 2018-06-06 NOTE — Plan of Care (Signed)
Problem: Safety: Goal: Ability to remain free from injury will improve Outcome: Progressing D: Pt awake in bed on initial approach this AM. A & O X4. Denies SI, HI, AVH and pain at the time. Presents disheveled with flat affect and depressed mood. Refused "I refused" her scheduled medications on three initial attempts then came up after 11:00 "I want my medicines". Pt reports she's sleeping well with good appetite. Pt did not attend groups or do her ADLs despite multiple prompts. OOB for medications and meals. Ambulatory with a slow but steady gait.   A: Emotional support and availability offered to pt on as need basis. Pt encouraged to voice concerns, attend to herADLS and comply with current treatment regimen including groups. Scheduled medications given as ordered with verbal education and effects monitored. Q 15 minutes safety checks continues without self harm gestures or outburst to note at this time.  R: Pt has been medication compliant. Denies adverse drug reactions when assessed. Pt has not taken shower despite agreeing to it earlier this shift "yeah, I have towels and stuff, I will take a shower". Denies concerns at this time, visiting with her mother at present. POC maintained for safety and mood stability.

## 2018-06-06 NOTE — Progress Notes (Signed)
Recreation Therapy Notes  Date: 06/06/2018  Time: 9:30 am   Location: Craft Room   Behavioral response: N/A   Intervention Topic: Problem Solving  Discussion/Intervention: Patient did not attend group.   Clinical Observations/Feedback:  Patient did not attend group.   Jehan Bonano LRT/CTRS        Jianni Shelden 06/06/2018 10:25 AM

## 2018-06-07 LAB — CBC WITH DIFFERENTIAL/PLATELET
BASOS ABS: 0.1 10*3/uL (ref 0–0.1)
Basophils Relative: 1 %
Eosinophils Absolute: 0.5 10*3/uL (ref 0–0.7)
Eosinophils Relative: 4 %
HCT: 37 % (ref 35.0–47.0)
Hemoglobin: 12.6 g/dL (ref 12.0–16.0)
LYMPHS PCT: 19 %
Lymphs Abs: 2.3 10*3/uL (ref 1.0–3.6)
MCH: 31.6 pg (ref 26.0–34.0)
MCHC: 34.2 g/dL (ref 32.0–36.0)
MCV: 92.5 fL (ref 80.0–100.0)
MONO ABS: 1.2 10*3/uL — AB (ref 0.2–0.9)
Monocytes Relative: 10 %
Neutro Abs: 8 10*3/uL — ABNORMAL HIGH (ref 1.4–6.5)
Neutrophils Relative %: 66 %
PLATELETS: 352 10*3/uL (ref 150–440)
RBC: 4 MIL/uL (ref 3.80–5.20)
RDW: 12.9 % (ref 11.5–14.5)
WBC: 12.1 10*3/uL — ABNORMAL HIGH (ref 3.6–11.0)

## 2018-06-07 NOTE — Plan of Care (Signed)
Visible in the milieu, pleasant on approach, observed with a teddy bear in her left breast pocket, "it makes me happy.." Towels, wash cloths and shampoo provided, encouraged to wash up and then she could come back and talk with me; she was also directed to remove 5 or 6 cups from her room; she took a shower, shampooed her hair and removed snacks and cups from her room; complied with medications as ordered.  Patient slept for Estimated Hours of 7.45; Precautionary checks every 15 minutes for safety maintained, room free of safety hazards, patient sustains no injury or falls during this shift.  Problem: Coping: Goal: Will verbalize feelings Outcome: Progressing   Problem: Safety: Goal: Ability to remain free from injury will improve Outcome: Progressing   Problem: Self-Concept: Goal: Will verbalize positive feelings about self Outcome: Progressing   Problem: Education: Goal: Knowledge of the prescribed therapeutic regimen will improve Outcome: Progressing   Problem: Coping: Goal: Will verbalize feelings Outcome: Progressing   Problem: Self-Concept: Goal: Level of anxiety will decrease Outcome: Progressing

## 2018-06-07 NOTE — [facility] Group Notes (Signed)
06/07/2018 1PM  Type of Therapy/Topic:  Group Therapy:  Feelings about Diagnosis  Participation Level:  Did Not Attend   Description of Group:   This group will allow patients to explore their thoughts and feelings about diagnoses they have received. Patients will be guided to explore their level of understanding and acceptance of these diagnoses. Facilitator will encourage patients to process their thoughts and feelings about the reactions of others to their diagnosis and will guide patients in identifying ways to discuss their diagnosis with significant others in their lives. This group will be process-oriented, with patients participating in exploration of their own experiences, giving and receiving support, and processing challenge from other group members.   Therapeutic Goals: 1. Patient will demonstrate understanding of diagnosis as evidenced by identifying two or more symptoms of the disorder 2. Patient will be able to express two feelings regarding the diagnosis 3. Patient will demonstrate their ability to communicate their needs through discussion and/or role play  Summary of Patient Progress: Patient was encouraged and invited to attend group. Patient did not attend group. Social worker will continue to encourage group participation in the future.        Therapeutic Modalities:   Cognitive Behavioral Therapy Brief Therapy Feelings Identification    Johny ShearsCassandra  Conlan Miceli, Alexander MtLCSW 06/07/2018 2:34 PM

## 2018-06-07 NOTE — Plan of Care (Signed)
Patient made aware of [facility]  education  and redirected . Continue to work on Materials engineercoping  skills . No safety  concerns . Continue to sleep during shift  Problem: Education: Goal: Knowledge of [facility] Education information/materials will improve Outcome: Progressing   Problem: Coping: Goal: Coping ability will improve Outcome: Progressing Goal: Will verbalize feelings Outcome: Progressing   Problem: Safety: Goal: Ability to remain free from injury will improve Outcome: Progressing   Problem: Self-Concept: Goal: Will verbalize positive feelings about self Outcome: Progressing   Problem: Coping: Goal: Coping ability will improve Outcome: Progressing   Problem: Role Relationship: Goal: Will demonstrate positive changes in social behaviors and relationships Outcome: Progressing   Problem: Safety: Goal: Ability to identify and utilize support systems that promote safety will improve Outcome: Progressing

## 2018-06-07 NOTE — Progress Notes (Signed)
D: Patient stated slept good last night .Stated appetite is good and energy level  Is normal. Stated concentration is good .  Noted concerned about going home .  Voice  Concern around her father  Coming for a family session  Denies suicidal  homicidal ideations  .  No auditory hallucinations  No pain concerns . Appropriate ADL'S. No  Interacting with peers and staff. Patient made aware of [facility]  education  and redirected . Continue to work on Materials engineercoping  skills . No safety  concerns . Continue to sleep during shift  A: Encourage patient participation with unit programming . Instruction  Given on  Medication , verbalize understanding. R: Voice no other concerns. Staff continue to monitor

## 2018-06-07 NOTE — Progress Notes (Signed)
Clozapine monitoring Consult   22 yo female ordered clozapine  06/07/18  ANC 8000  Information entered into Clozapine registry.  Next labs due in a week - ordered for 06/14/18  Pharmacy will continue to follow.

## 2018-06-07 NOTE — [facility] Group Notes (Signed)
[facility] Group Notes:  (Nursing/MHT/Case Management/Adjunct)  Date:  06/07/2018  Time:  12:36 AM  Type of Therapy:  Group Therapy  Participation Level:  Did Not Attend   Jinger NeighborsKeith D Jaymen Fetch 06/07/2018, 12:36 AM

## 2018-06-07 NOTE — Progress Notes (Signed)
Recreation Therapy Notes  Date: 06/07/2018  Time: 9:30 am   Location: Craft Room   Behavioral response: N/A   Intervention Topic: Communication  Discussion/Intervention: Patient did not attend group.   Clinical Observations/Feedback:  Patient did not attend group.   Jervis Trapani LRT/CTRS        Kimberly Boyle 06/07/2018 11:32 AM 

## 2018-06-07 NOTE — [facility] Group Notes (Signed)
[facility] Group Notes:  (Nursing/MHT/Case Management/Adjunct)  Date:  06/07/2018  Time:  9:29 PM  Type of Therapy:  Group Therapy  Participation Level:  Did Not Attend   Mayra NeerJackie L Areej Tayler 06/07/2018, 9:29 PM

## 2018-06-08 LAB — LITHIUM LEVEL: Lithium Lvl: 1.33 mmol/L — ABNORMAL HIGH (ref 0.60–1.20)

## 2018-06-08 MED ORDER — IPRATROPIUM BROMIDE 0.06 % NA SOLN: 2.0000 | Freq: Every day | NASAL | 12 refills | Status: DC

## 2018-06-08 MED ORDER — METOPROLOL TARTRATE 25 MG PO TABS: 25.0000 mg | ORAL_TABLET | Freq: Two times a day (BID) | ORAL | 1 refills | Status: DC

## 2018-06-08 MED ORDER — PALIPERIDONE PALMITATE ER 234 MG/1.5ML IM SUSY: 234.0000 mg | PREFILLED_SYRINGE | INTRAMUSCULAR | 1 refills | Status: DC

## 2018-06-08 MED ORDER — IPRATROPIUM BROMIDE 0.06 % NA SOLN
2.0000 | Freq: Every day | NASAL | Status: DC
  Filled 2018-06-08: qty 15

## 2018-06-08 MED ORDER — PALIPERIDONE PALMITATE ER 234 MG/1.5ML IM SUSY
234.0000 mg | PREFILLED_SYRINGE | INTRAMUSCULAR | Status: DC
  Administered 2018-06-08: 234 mg via INTRAMUSCULAR
  Filled 2018-06-08: qty 1.5

## 2018-06-08 MED ORDER — CLOZAPINE 100 MG PO TBDP: 400.0000 mg | ORAL_TABLET | Freq: Every day | ORAL | 1 refills | Status: DC

## 2018-06-08 NOTE — Progress Notes (Signed)
Patient stayed in the room and did not program in group activities. Was present in the milieu at medication time. Presented to the medication room, pleasant and cooperative. Expressed readiness for discharge. Discussed her medication regime and verbalized understanding. Received medication and returned to her room. Currently in bed sleeping. No sign of discomfort. Staff continue to monitor for safety and other needs.

## 2018-06-08 NOTE — Progress Notes (Signed)
D: Patient is aware of  Discharge this shift .Patient denies suicidal /homicidal ideations. Patient received all belongings brought in  A: No Storage medications. Writer reviewed Discharge Summary, Suicide Risk Assessment, and Transitional Record. Patient also received Prescriptions   from  MD.  Aware  Of follow up appointment with ACT Team  Discussed with  Mother  R: Patient left unit with no questions  Or concerns  With mother

## 2018-06-08 NOTE — Progress Notes (Signed)
  [facility]  Will you be returning to the same living situation after discharge:  Yes,    At discharge, do you have transportation home?: Yes,    Do you have the ability to pay for your medications: Yes,     Release of information consent forms completed and in the chart;  Patient's signature needed at discharge.  Patient to Follow up at: Follow-up Information    Services, Psychotherapeutic Follow up.   Why:  The ACTT will be coming to visit with you within the week.  Contact information: [address] [place] [address] [phone]           Next level of care provider [facility] Link:no  Safety Planning and Suicide Prevention discussed: Yes,     Have you used any form of tobacco in the last 30 days? (Cigarettes, Smokeless Tobacco, Cigars, and/or Pipes): Yes  Has patient been referred to the Quitline?: Patient refused referral  Patient has been referred for addiction treatment: Yes  Glennon MacSara P Estella Malatesta, LCSW 06/08/2018, 3:36 PM

## 2018-06-08 NOTE — Plan of Care (Signed)
Expressing readiness for discharge 

## 2018-06-08 NOTE — Progress Notes (Signed)
Recreation Therapy Notes  Date: 06/09/2018  Time: 9:30 am   Location: Craft Room   Behavioral response: N/A   Intervention Topic: Emotions  Discussion/Intervention: Patient did not attend group.   Clinical Observations/Feedback:  Patient did not attend group.   Anurag Scarfo LRT/CTRS        Kimberly Boyle 06/08/2018 10:22 AM 

## 2018-06-08 NOTE — Progress Notes (Signed)
Recreation Therapy Notes  INPATIENT RECREATION TR PLAN  Patient Details Name: Jadon Ressler MRN: [id number] DOB: [birth year] Today's Date: 06/08/2018  Rec Therapy Plan Is patient appropriate for Therapeutic Recreation?: Yes Treatment times per week: At least 3 Estimated Length of Stay: 5-7 days TR Treatment/Interventions: Group participation (Comment)  Discharge Criteria Pt will be discharged from therapy if:: Discharged Treatment plan/goals/alternatives discussed and agreed upon by:: Patient/family  Discharge Summary Short term goals set:  Patient will identify 2 ways of improving communication post d/c within 5 recreation therapy group sessions  Short term goals met: Not met Reason goals not met: Patient did not attend any groups. Therapeutic equipment acquired: N/A Reason patient discharged from therapy: Discharge from hospital Pt/family agrees with progress & goals achieved: Yes Date patient discharged from therapy: 06/08/18   Zyan Coby 06/08/2018, 12:35 PM

## 2018-06-10 ENCOUNTER — Other Ambulatory Visit: Payer: Self-pay

## 2018-06-10 ENCOUNTER — Emergency Department
Admission: EM | Admit: 2018-06-10 | Discharge: 2018-06-11 | Disposition: A | Discharge status: Home / Self Care | Payer: BLUE CROSS/BLUE SHIELD | Attending: Emergency Medicine | Admitting: Emergency Medicine

## 2018-06-10 DIAGNOSIS — F600 Paranoid personality disorder: Secondary | ICD-10-CM | POA: Diagnosis not present

## 2018-06-10 DIAGNOSIS — F329 Major depressive disorder, single episode, unspecified: Secondary | ICD-10-CM | POA: Diagnosis not present

## 2018-06-10 DIAGNOSIS — F17210 Nicotine dependence, cigarettes, uncomplicated: Secondary | ICD-10-CM | POA: Insufficient documentation

## 2018-06-10 DIAGNOSIS — Z79899 Other long term (current) drug therapy: Secondary | ICD-10-CM | POA: Insufficient documentation

## 2018-06-10 DIAGNOSIS — F200 Paranoid schizophrenia: Secondary | ICD-10-CM

## 2018-06-10 DIAGNOSIS — F22 Delusional disorders: Secondary | ICD-10-CM

## 2018-06-10 LAB — URINE DRUG SCREEN, QUALITATIVE (ARMC ONLY)
Amphetamines, Ur Screen: NOT DETECTED
BARBITURATES, UR SCREEN: NOT DETECTED
CANNABINOID 50 NG, UR ~~LOC~~: POSITIVE — AB
Cocaine Metabolite,Ur ~~LOC~~: NOT DETECTED
MDMA (Ecstasy)Ur Screen: NOT DETECTED
Methadone Scn, Ur: NOT DETECTED
Opiate, Ur Screen: NOT DETECTED
Phencyclidine (PCP) Ur S: NOT DETECTED
TRICYCLIC, UR SCREEN: NOT DETECTED

## 2018-06-10 LAB — COMPREHENSIVE METABOLIC PANEL
ALK PHOS: 71 U/L (ref 38–126)
ALT: 21 U/L (ref 0–44)
AST: 29 U/L (ref 15–41)
Albumin: 3.6 g/dL (ref 3.5–5.0)
Anion gap: 5 (ref 5–15)
BILIRUBIN TOTAL: 0.3 mg/dL (ref 0.3–1.2)
BUN: 10 mg/dL (ref 6–20)
CALCIUM: 8.6 mg/dL — AB (ref 8.9–10.3)
CO2: 25 mmol/L (ref 22–32)
CREATININE: 0.52 mg/dL (ref 0.44–1.00)
Chloride: 107 mmol/L (ref 98–111)
GFR calc Af Amer: >60 mL/min (ref >60)
Glucose, Bld: 102 mg/dL — ABNORMAL HIGH (ref 70–99)
Potassium: 3.6 mmol/L (ref 3.5–5.1)
Sodium: 137 mmol/L (ref 135–145)
TOTAL PROTEIN: 6.4 g/dL — AB (ref 6.5–8.1)

## 2018-06-10 LAB — CBC
HCT: 33.7 % — ABNORMAL LOW (ref 35.0–47.0)
Hemoglobin: 11.7 g/dL — ABNORMAL LOW (ref 12.0–16.0)
MCH: 31.8 pg (ref 26.0–34.0)
MCHC: 34.8 g/dL (ref 32.0–36.0)
MCV: 91.4 fL (ref 80.0–100.0)
Platelets: 342 10*3/uL (ref 150–440)
RBC: 3.68 MIL/uL — ABNORMAL LOW (ref 3.80–5.20)
RDW: 13.1 % (ref 11.5–14.5)
WBC: 18.2 10*3/uL — ABNORMAL HIGH (ref 3.6–11.0)

## 2018-06-10 LAB — POCT PREGNANCY, URINE: Preg Test, Ur: NEGATIVE

## 2018-06-10 LAB — ETHANOL

## 2018-06-10 NOTE — ED Provider Notes (Signed)
[facility] Emergency Department Provider Note  Time seen: 10:50 PM  I have reviewed the triage vital signs and the nursing notes.   HISTORY  Chief Complaint Psychiatric Evaluation    HPI Kimberly Boyle is a 22 y.o. female with a past medical history of anxiety, bipolar, schizophrenia, presents to the emergency department with paranoia thinking someone is going to rape her.  According to the patient for the past several days she has had a thought that someone is going to rape her, denies being threatened, denies anyone in particular threatening to rape her.  States she is never been raped in the past.  Denies any SI or HI.  Denies any recent alcohol or drug use.  No medical complaints today.    Past Medical History:  Diagnosis Date  . Anxiety   . Arthritis   . Bipolar disorder (HCC)   . Depression   . Major depressive disorder   . Panic disorder   . Schizophrenia [facility])     Patient Active Problem List   Diagnosis Date Noted  . Tobacco use disorder 05/08/2018  . Schizoaffective disorder, bipolar type (HCC) 05/06/2018  . Suicide attempt (HCC) 08/20/2017  . Cannabis use disorder, moderate, dependence (HCC) 08/20/2017  . Self-inflicted laceration of wrist 08/20/2017  . Acetaminophen poisoning 08/20/2017  . Generalized anxiety disorder 01/05/2017  . Mood disorder (HCC) 01/05/2017    No past surgical history on file.  Prior to Admission medications   Medication Sig Start Date End Date Taking? Authorizing Provider  clonazePAM (KLONOPIN) 0.5 MG tablet Take 1 tablet (0.5 mg total) by mouth 2 (two) times daily as needed (anxiety). 05/31/18   Pucilowska, Braulio Conte B, MD  clozapine (CLOZARIL) 200 MG tablet Take 2 tablets (400 mg total) by mouth at bedtime. If available, the patient is to take Fazaclo 400 mg nightly instead of Clozapine 400 mg nightly 06/01/18   Pucilowska, [name], MD  clozapine (FAZACLO) 100 MG disintegrating tablet Take 4 tablets (400 mg  total) by mouth daily. 06/08/18   Pucilowska, [name], MD  ipratropium (ATROVENT) 0.06 % nasal spray Place 2 sprays into both nostrils at bedtime. 06/08/18   Pucilowska, Braulio Conte B, MD  lithium carbonate 150 MG capsule Take 5 capsules (750 mg total) by mouth 2 (two) times daily with a meal. 06/01/18   Pucilowska, [name], MD  metFORMIN (GLUCOPHAGE) 500 MG tablet Take 1 tablet (500 mg total) by mouth 2 (two) times daily with a meal. 06/01/18   Pucilowska, [name], MD  metoprolol tartrate (LOPRESSOR) 25 MG tablet Take 1 tablet (25 mg total) by mouth 2 (two) times daily. 06/08/18   Pucilowska, [name], MD  paliperidone (INVEGA SUSTENNA) 234 MG/1.5ML SUSY injection Inject 234 mg into the muscle every 28 (twenty-eight) days. Next dose on 07/06/2018 07/06/18   Pucilowska, Ellin Goodie, MD  VIENVA 0.1-20 MG-MCG tablet TK 1 T PO DAILY 02/10/18   [provider]    Allergies  Allergen Reactions  . Penicillins Nausea Only    Has patient had a PCN reaction causing immediate rash, facial/tongue/throat swelling, SOB or lightheadedness with hypotension:  no Has patient had a PCN reaction causing severe rash involving mucus membranes or skin necrosis:  no Has patient had a PCN reaction that required hospitalization: no Has patient had a PCN reaction occurring within the last 10 years: no If all of the above answers are "NO", then may proceed with Cephalosporin use.     Family History  Problem Relation  Age of Onset  . Post-traumatic stress disorder Mother   . Depression Mother   . Depression Maternal Uncle   . Cancer Maternal Grandmother     Social History Social History   Tobacco Use  . Smoking status: Current Every Day Smoker    Packs/day: 0.25    Types: Cigarettes  . Smokeless tobacco: Never Used  Substance Use Topics  . Alcohol use: No    Frequency: Never  . Drug use: No    Types: Marijuana    Review of Systems Constitutional: Negative for fever. Cardiovascular: Negative for  chest pain. Respiratory: Negative for shortness of breath. Gastrointestinal: Negative for abdominal pain, vomiting and diarrhea. Genitourinary: Negative for urinary compaints Musculoskeletal: Negative for musculoskeletal complaints Skin: Negative for skin complaints  Neurological: Negative for headache All other ROS negative  ____________________________________________   PHYSICAL EXAM:  VITAL SIGNS: ED Triage Vitals  Enc Vitals Group     BP 06/10/18 2203 131/70     Pulse Rate 06/10/18 2203 (!) 108     Resp 06/10/18 2203 16     Temp 06/10/18 2203 99 F (37.2 C)     Temp Source 06/10/18 2203 Oral     SpO2 06/10/18 2203 100 %     Weight 06/10/18 2204 150 lb (68 kg)     Height 06/10/18 2204 5\' 3"  (1.6 m)     Head Circumference --      Peak Flow --      Pain Score 06/10/18 2203 0     Pain Loc --      Pain Edu? --      Excl. in GC? --    Constitutional: Alert and oriented. Well appearing and in no distress. Eyes: Normal exam ENT   Head: Normocephalic and atraumatic.   Mouth/Throat: Mucous membranes are moist. Cardiovascular: Normal rate, regular rhythm. No murmur Respiratory: Normal respiratory effort without tachypnea nor retractions. Breath sounds are clear Gastrointestinal: Soft and nontender. No distention.  Musculoskeletal: Nontender with normal range of motion in all extremities.  Neurologic:  Normal speech and language. No gross focal neurologic deficits  Skin:  Skin is warm, dry and intact.  Psychiatric: Patient is somewhat paranoid that someone might Rudell CobbRaper is currently calm and cooperative without SI or HI.  ____________________________________________  INITIAL IMPRESSION / ASSESSMENT AND PLAN / ED COURSE  Pertinent labs & imaging results that were available during my care of the patient were reviewed by me and considered in my medical decision making (see chart for details).  Patient presents to the emergency department thinking that someone is going to  rape her.  Is paranoid but denies anyone threatening to rape her and denies being raped in the past.  She is not sure why she thinks someone is going to rape her.  States she is on medications but takes all of her medications as prescribed and has not missed any doses denies any substance use.  Patient has no medical complaints and overall normal physical examination. Patient's medical work-up is been largely nonrevealing.  Patient will be seen by psychiatry.  Currently here voluntarily given no SI or HI, calm and cooperative. ____________________________________________   FINAL CLINICAL IMPRESSION(S) / ED DIAGNOSES  Aura CampsParanoia    Allon Costlow, MD 06/10/18 2255

## 2018-06-10 NOTE — ED Notes (Signed)
Pt dressed out by Arlyss Repressalyssa and kadijah, ed techs.

## 2018-06-10 NOTE — ED Notes (Addendum)
Pt Belongings: 1 Silver colored nose ring, 1 silver colored earring, 1 gold colored ring, 1 navy colored dress with flowers, 1 pair of black flats( shoes) 1 pairof black shorts, 1 grey colored  backpack with clothes, 1 aztec colored pink backpack with clothes, 1 [employer] device   Pt throw away underwear in triage room.

## 2018-06-10 NOTE — ED Triage Notes (Signed)
Pt brought by  sherriff, voluntary for psychiatric evaluation. Pt states she is bipolar and is worried "someone is coming to rape me". Pt currently sees [facility] for mental health. Pt is requesting admission "to the hospital to get sorted out"

## 2018-06-11 ENCOUNTER — Emergency Department: Payer: BLUE CROSS/BLUE SHIELD

## 2018-06-11 LAB — URINALYSIS, COMPLETE (UACMP) WITH MICROSCOPIC
BILIRUBIN URINE: NEGATIVE
GLUCOSE, UA: NEGATIVE mg/dL
Hgb urine dipstick: NEGATIVE
Ketones, ur: NEGATIVE mg/dL
NITRITE: NEGATIVE
PROTEIN: NEGATIVE mg/dL
Specific Gravity, Urine: 1.014 (ref 1.005–1.030)
pH: 6 (ref 5.0–8.0)

## 2018-06-11 LAB — DIFFERENTIAL
BASOS ABS: 0.1 10*3/uL (ref 0–0.1)
Basophils Relative: 0 %
EOS PCT: 4 %
Eosinophils Absolute: 0.7 10*3/uL (ref 0–0.7)
LYMPHS ABS: 2.6 10*3/uL (ref 1.0–3.6)
LYMPHS PCT: 14 %
Monocytes Absolute: 1.2 10*3/uL — ABNORMAL HIGH (ref 0.2–0.9)
Monocytes Relative: 6 %
NEUTROS ABS: 14.2 10*3/uL — AB (ref 1.4–6.5)
NEUTROS PCT: 76 %

## 2018-06-11 LAB — LITHIUM LEVEL: LITHIUM LVL: 0.49 mmol/L — AB (ref 0.60–1.20)

## 2018-06-11 MED ORDER — FOSFOMYCIN TROMETHAMINE 3 G PO PACK
3.0000 g | PACK | Freq: Once | ORAL | Status: AC
  Administered 2018-06-11: 3 g via ORAL
  Filled 2018-06-11: qty 3

## 2018-06-11 NOTE — ED Notes (Signed)
Pt awakened and given ordered medications. Clothes given to dress. Mother called and is on the way to pick her up.

## 2018-06-11 NOTE — Discharge Instructions (Addendum)
You were treated with a single dose of antibiotic for your UTI.  Continue your current medicines and please follow-up with the resources provided to you.  Return to the ER for worsening symptoms, feelings of hurting herself or others, or other concerns.

## 2018-06-11 NOTE — ED Provider Notes (Signed)
-----------------------------------------   2:20 AM on 06/11/2018 -----------------------------------------  [facility] Dr[facility] Orpah Clintonollin who deems patient psychiatrically stable for discharge home with intensive outpatient follow-up.  No new medication recommendations.  Mother is comfortable picking patient up.  Lithium level, differential, chest x-ray and urinalysis were added for leukocytosis noted on lab work.  Waiting for those results.   ----------------------------------------- 3:09 AM on 06/11/2018 -----------------------------------------  Chest x-ray negative for pneumonia.  Trace leukocyte noted on urinalysis.  Will administer 1 packet fosfomycin and discharge home with outpatient follow-up.  Strict return precautions given.  Patient verbalizes understanding and agrees with plan of care.   Irean HongSung, Jade J, MD 06/11/18 [phone]

## 2018-06-11 NOTE — BH Assessment (Signed)
Patient has been seen by Psych MD [facility]) and will be discharged with outpatient resources. TTS isn't needed at this time, consult will be discontinued.

## 2018-06-11 NOTE — ED Notes (Signed)
[facility] Pt awakened for consult.

## 2018-06-11 NOTE — ED Notes (Signed)
This RN spoke with patients mom with the patients permission. Mom agreeable to outpatient program referral information and will come to pick up patient when discharged.

## 2018-06-14 ENCOUNTER — Emergency Department
Admission: EM | Admit: 2018-06-14 | Discharge: 2018-06-14 | Disposition: A | Discharge status: Home / Self Care | Payer: BLUE CROSS/BLUE SHIELD | Attending: Emergency Medicine | Admitting: Emergency Medicine

## 2018-06-14 ENCOUNTER — Other Ambulatory Visit: Payer: Self-pay

## 2018-06-14 DIAGNOSIS — F1220 Cannabis dependence, uncomplicated: Secondary | ICD-10-CM | POA: Diagnosis present

## 2018-06-14 DIAGNOSIS — F29 Unspecified psychosis not due to a substance or known physiological condition: Secondary | ICD-10-CM | POA: Diagnosis present

## 2018-06-14 DIAGNOSIS — F17210 Nicotine dependence, cigarettes, uncomplicated: Secondary | ICD-10-CM | POA: Diagnosis not present

## 2018-06-14 DIAGNOSIS — Z79899 Other long term (current) drug therapy: Secondary | ICD-10-CM | POA: Insufficient documentation

## 2018-06-14 DIAGNOSIS — F22 Delusional disorders: Secondary | ICD-10-CM | POA: Diagnosis not present

## 2018-06-14 DIAGNOSIS — F250 Schizoaffective disorder, bipolar type: Secondary | ICD-10-CM | POA: Diagnosis not present

## 2018-06-14 DIAGNOSIS — Z7984 Long term (current) use of oral hypoglycemic drugs: Secondary | ICD-10-CM | POA: Diagnosis not present

## 2018-06-14 LAB — COMPREHENSIVE METABOLIC PANEL
ALT: 28 U/L (ref 0–44)
ANION GAP: 8 (ref 5–15)
AST: 26 U/L (ref 15–41)
Albumin: 3.8 g/dL (ref 3.5–5.0)
Alkaline Phosphatase: 74 U/L (ref 38–126)
BUN: 12 mg/dL (ref 6–20)
CHLORIDE: 106 mmol/L (ref 98–111)
CO2: 26 mmol/L (ref 22–32)
Calcium: 10 mg/dL (ref 8.9–10.3)
Creatinine, Ser: 0.62 mg/dL (ref 0.44–1.00)
Glucose, Bld: 106 mg/dL — ABNORMAL HIGH (ref 70–99)
POTASSIUM: 4.2 mmol/L (ref 3.5–5.1)
Sodium: 140 mmol/L (ref 135–145)
Total Bilirubin: 0.4 mg/dL (ref 0.3–1.2)
Total Protein: 7 g/dL (ref 6.5–8.1)

## 2018-06-14 LAB — CBC
HEMATOCRIT: 38.7 % (ref 35.0–47.0)
HEMOGLOBIN: 13.1 g/dL (ref 12.0–16.0)
MCH: 31.4 pg (ref 26.0–34.0)
MCHC: 33.9 g/dL (ref 32.0–36.0)
MCV: 92.8 fL (ref 80.0–100.0)
Platelets: 411 10*3/uL (ref 150–440)
RBC: 4.17 MIL/uL (ref 3.80–5.20)
RDW: 13.4 % (ref 11.5–14.5)
WBC: 14.3 10*3/uL — AB (ref 3.6–11.0)

## 2018-06-14 LAB — URINE DRUG SCREEN, QUALITATIVE (ARMC ONLY)
AMPHETAMINES, UR SCREEN: NOT DETECTED
Barbiturates, Ur Screen: NOT DETECTED
Cannabinoid 50 Ng, Ur ~~LOC~~: POSITIVE — AB
Cocaine Metabolite,Ur ~~LOC~~: NOT DETECTED
MDMA (Ecstasy)Ur Screen: NOT DETECTED
METHADONE SCREEN, URINE: NOT DETECTED
OPIATE, UR SCREEN: NOT DETECTED
Phencyclidine (PCP) Ur S: NOT DETECTED
Tricyclic, Ur Screen: NOT DETECTED

## 2018-06-14 LAB — SALICYLATE LEVEL

## 2018-06-14 LAB — PREGNANCY, URINE: PREG TEST UR: NEGATIVE

## 2018-06-14 LAB — ACETAMINOPHEN LEVEL: Acetaminophen (Tylenol), Serum: <10 ug/mL — ABNORMAL LOW (ref 10–30)

## 2018-06-14 LAB — ETHANOL

## 2018-06-14 NOTE — ED Triage Notes (Signed)
Pt states her mom brought her here because she feels safer here. Wants to be admitted to [facility] for psych. States SI. Denies HI. States SI x few days. Denies plan. States takes meds and hasn't missed any doses. Alert, oriented.   Mom Shaheen Suchanek [phone]. Pt gave permission to give mom password.

## 2018-06-14 NOTE — Consult Note (Signed)
Kimberly Boyle Psychiatry Consult   Reason for Consult: Consult for 22 year old woman with a history of psychosis who comes into the emergency room with her delusion Referring Physician: Burlene Arnt Patient Identification: Kimberly Boyle MRN:  [id number] Principal Diagnosis: Schizoaffective disorder, bipolar type [facility]) Diagnosis:   Patient Active Problem List   Diagnosis Date Noted  . Tobacco use disorder [F17.200] 05/08/2018  . Schizoaffective disorder, bipolar type ([place]) [F25.0] 05/06/2018  . Suicide attempt ([place]) [T14.91XA] 08/20/2017  . Cannabis use disorder, moderate, dependence ([place]) [F12.20] 08/20/2017  . Self-inflicted laceration of wrist [S61.519A] 08/20/2017  . Acetaminophen poisoning [T39.1X1A] 08/20/2017  . Generalized anxiety disorder [F41.1] 01/05/2017  . Mood disorder ([place]) [F39] 01/05/2017    Total Time spent with patient: 1 hour  Subjective:   Kimberly Boyle is a 22 y.o. female patient admitted with "I just feel scared at home".  HPI: Patient interviewed chart reviewed.  22 year old woman who was just discharged from the hospital recently after a long psychiatric stay.  She was in the hospital with a psychotic disorder most characterized by her chronic paranoid delusion that there is someone she identifies as "a black cop" who is planning to kidnap and rape and kill her.  This has been the same delusion she has been suffering from since before her most recent hospitalization.  She says that since going home she has just stayed around the house spending all of her time in her bedroom.  She says she has been compliant with medicine.  She denies substance abuse although her drug screen continues to be positive for marijuana.  Patient denies having any suicidal thought plan or intent.  Denies any homicidal ideation.  She tells me that she thinks her mother brought her here today because her mother is just "tired of hearing about it" referring to her delusion.  Patient has  been back in the emergency room several times recently with the same complaint.  There does not appear to be anything identifiable he different about it today.  Social history: Lives with her parents.  Not able to work.  Pretty disabled by this.  Medical history: Past history of self-inflicted cuts but none currently no recent self injury no other ongoing medical problems outside of the psychiatric  Substance abuse history: Ongoing marijuana abuse that she does not appear to be motivated to change.  Past Psychiatric History: Patient has a history of a psychotic disorder which seems to be somewhat recent in its development.  She had a lengthy hospitalization here this summer during which time she was actually put on very powerful medication including her current combination of clozapine and lithium and yet still has her delusion.  She does have a history of self injury in the past.  Risk to Self:   Risk to Others:   Prior Inpatient Therapy:   Prior Outpatient Therapy:    Past Medical History:  Past Medical History:  Diagnosis Date  . Anxiety   . Arthritis   . Bipolar disorder ([place])   . Depression   . Major depressive disorder   . Panic disorder   . Schizophrenia ([place])    History reviewed. No pertinent surgical history. Family History:  Family History  Problem Relation Age of Onset  . Post-traumatic stress disorder Mother   . Depression Mother   . Depression Maternal Uncle   . Cancer Maternal Grandmother    Family Psychiatric  History: None known except for some family depression Social History:  Social History   Substance  and Sexual Activity  Alcohol Use No  . Frequency: Never     Social History   Substance and Sexual Activity  Drug Use No  . Types: Marijuana    Social History   Socioeconomic History  . Marital status: Single    Spouse name: Not on file  . Number of children: 0  . Years of education: Not on file  . Highest education level: High school graduate   Occupational History    Comment: unemployed  Social Needs  . Financial resource strain: Not on file  . Food insecurity:    Worry: Not on file    Inability: Not on file  . Transportation needs:    Medical: No    Non-medical: No  Tobacco Use  . Smoking status: Current Every Day Smoker    Packs/day: 0.25    Types: Cigarettes  . Smokeless tobacco: Never Used  Substance and Sexual Activity  . Alcohol use: No    Frequency: Never  . Drug use: No    Types: Marijuana  . Sexual activity: Not Currently  Lifestyle  . Physical activity:    Days per week: 0 days    Minutes per session: 0 min  . Stress: To some extent  Relationships  . Social connections:    Talks on phone: Not on file    Gets together: Not on file    Attends religious service: Never    Active member of club or organization: No    Attends meetings of clubs or organizations: Never    Relationship status: Never married  Other Topics Concern  . Not on file  Social History Narrative   ** Merged History Encounter **       Additional Social History:    Allergies:   Allergies  Allergen Reactions  . Penicillins Nausea Only    Has patient had a PCN reaction causing immediate rash, facial/tongue/throat swelling, SOB or lightheadedness with hypotension:  no Has patient had a PCN reaction causing severe rash involving mucus membranes or skin necrosis:  no Has patient had a PCN reaction that required hospitalization: no Has patient had a PCN reaction occurring within the last 10 years: no If all of the above answers are "NO", then may proceed with Cephalosporin use.     Labs:  Results for orders placed or performed during the hospital encounter of 06/14/18 (from the past 48 hour(s))  Comprehensive metabolic panel     Status: Abnormal   Collection Time: 06/14/18  3:54 PM  Result Value Ref Range   Sodium 140 135 - 145 mmol/L   Potassium 4.2 3.5 - 5.1 mmol/L   Chloride 106 98 - 111 mmol/L   CO2 26 22 - 32 mmol/L    Glucose, Bld 106 (H) 70 - 99 mg/dL   BUN 12 6 - 20 mg/dL   Creatinine, Ser 0.62 0.44 - 1.00 mg/dL   Calcium 10.0 8.9 - 10.3 mg/dL   Total Protein 7.0 6.5 - 8.1 g/dL   Albumin 3.8 3.5 - 5.0 g/dL   AST 26 15 - 41 U/L   ALT 28 0 - 44 U/L   Alkaline Phosphatase 74 38 - 126 U/L   Total Bilirubin 0.4 0.3 - 1.2 mg/dL   GFR calc non Af Amer >60 >60 mL/min   GFR calc Af Amer >60 >60 mL/min    Comment: (NOTE) The eGFR has been calculated using the CKD EPI equation. This calculation has not been validated in all clinical situations. eGFR's persistently <  60 mL/min signify possible Chronic Kidney Disease.    Anion gap 8 5 - 15    Comment: Performed at [facility], [address] [place], [place] [address]  Ethanol     Status: None   Collection Time: 06/14/18  3:54 PM  Result Value Ref Range   Alcohol, Ethyl (B) <10 <10 mg/dL    Comment: (NOTE) Lowest detectable limit for serum alcohol is 10 mg/dL. For medical purposes only. Performed at [facility], [address] [place], [place] [address]   Salicylate level     Status: None   Collection Time: 06/14/18  3:54 PM  Result Value Ref Range   Salicylate Lvl <8.5 2.8 - 30.0 mg/dL    Comment: Performed at [facility], [address] [place], [place] [address]  Acetaminophen level     Status: Abnormal   Collection Time: 06/14/18  3:54 PM  Result Value Ref Range   Acetaminophen (Tylenol), Serum <10 (L) 10 - 30 ug/mL    Comment: (NOTE) Therapeutic concentrations vary significantly. A range of 10-30 ug/mL  may be an effective concentration for many patients. However, some  are best treated at concentrations outside of this range. Acetaminophen concentrations >150 ug/mL at 4 hours after ingestion  and >50 ug/mL at 12 hours after ingestion are often associated with  toxic reactions. Performed at [facility], [address] [place], [place] [address]   cbc     Status: Abnormal   Collection Time:  06/14/18  3:54 PM  Result Value Ref Range   WBC 14.3 (H) 3.6 - 11.0 K/uL   RBC 4.17 3.80 - 5.20 MIL/uL   Hemoglobin 13.1 12.0 - 16.0 g/dL   HCT 38.7 35.0 - 47.0 %   MCV 92.8 80.0 - 100.0 fL   MCH 31.4 26.0 - 34.0 pg   MCHC 33.9 32.0 - 36.0 g/dL   RDW 13.4 11.5 - 14.5 %   Platelets 411 150 - 440 K/uL    Comment: Performed at [facility], [address] [place], [place] [address]  Urine Drug Screen, Qualitative     Status: Abnormal   Collection Time: 06/14/18  3:54 PM  Result Value Ref Range   Tricyclic, Ur Screen NONE DETECTED NONE DETECTED   Amphetamines, Ur Screen NONE DETECTED NONE DETECTED   MDMA (Ecstasy)Ur Screen NONE DETECTED NONE DETECTED   Cocaine Metabolite,Ur Wisdom NONE DETECTED NONE DETECTED   Opiate, Ur Screen NONE DETECTED NONE DETECTED   Phencyclidine (PCP) Ur S NONE DETECTED NONE DETECTED   Cannabinoid 50 Ng, Ur [place] POSITIVE (A) NONE DETECTED   Barbiturates, Ur Screen NONE DETECTED NONE DETECTED   Benzodiazepine, Ur Scrn TEST NOT PERFORMED, REAGENT NOT AVAILABLE (A) NONE DETECTED   Methadone Scn, Ur NONE DETECTED NONE DETECTED    Comment: (NOTE) Tricyclics + metabolites, urine    Cutoff 1000 ng/mL Amphetamines + metabolites, urine  Cutoff 1000 ng/mL MDMA (Ecstasy), urine              Cutoff 500 ng/mL Cocaine Metabolite, urine          Cutoff 300 ng/mL Opiate + metabolites, urine        Cutoff 300 ng/mL Phencyclidine (PCP), urine         Cutoff 25 ng/mL Cannabinoid, urine                 Cutoff 50 ng/mL Barbiturates + metabolites, urine  Cutoff 200 ng/mL Benzodiazepine, urine  Cutoff 200 ng/mL Methadone, urine                   Cutoff 300 ng/mL The urine drug screen provides only a preliminary, unconfirmed analytical test result and should not be used for non-medical purposes. Clinical consideration and professional judgment should be applied to any positive drug screen result due to possible interfering substances. A more specific alternate  chemical method must be used in order to obtain a confirmed analytical result. Gas chromatography / mass spectrometry (GC/MS) is the preferred confirmat ory method. Performed at [facility], [address] [place], [place] [address]   Pregnancy, urine     Status: None   Collection Time: 06/14/18  3:54 PM  Result Value Ref Range   Preg Test, Ur NEGATIVE NEGATIVE    Comment: Performed at [facility], [address] [place], [place] [address]    No current facility-administered medications for this encounter.    Current Outpatient Medications  Medication Sig Dispense Refill  . clonazePAM (KLONOPIN) 0.5 MG tablet Take 1 tablet (0.5 mg total) by mouth 2 (two) times daily as needed (anxiety). 60 tablet 0  . clozapine (CLOZARIL) 200 MG tablet Take 2 tablets (400 mg total) by mouth at bedtime. If available, the patient is to take Fazaclo 400 mg nightly instead of Clozapine 400 mg nightly 60 tablet 1  . clozapine (FAZACLO) 100 MG disintegrating tablet Take 4 tablets (400 mg total) by mouth daily. 120 tablet 1  . ipratropium (ATROVENT) 0.06 % nasal spray Place 2 sprays into both nostrils at bedtime. 15 mL 12  . lithium carbonate 150 MG capsule Take 5 capsules (750 mg total) by mouth 2 (two) times daily with a meal. 300 capsule 1  . metFORMIN (GLUCOPHAGE) 500 MG tablet Take 1 tablet (500 mg total) by mouth 2 (two) times daily with a meal. 60 tablet 1`  . metoprolol tartrate (LOPRESSOR) 25 MG tablet Take 1 tablet (25 mg total) by mouth 2 (two) times daily. 60 tablet 1  . [START ON 07/06/2018] paliperidone (INVEGA SUSTENNA) 234 MG/1.5ML SUSY injection Inject 234 mg into the muscle every 28 (twenty-eight) days. Next dose on 07/06/2018 1.8 mL 1  . VIENVA 0.1-20 MG-MCG tablet TK 1 T PO DAILY  3    Musculoskeletal: Strength & Muscle Tone: within normal limits Gait & Station: normal Patient leans: N/A  Psychiatric Specialty Exam: Physical Exam  Nursing note and vitals  reviewed. Constitutional: She appears well-developed and well-nourished.  HENT:  Head: Normocephalic and atraumatic.  Eyes: Pupils are equal, round, and reactive to light. Conjunctivae are normal.  Neck: Normal range of motion.  Cardiovascular: Regular rhythm and normal heart sounds.  Respiratory: Effort normal. No respiratory distress.  GI: Soft.  Musculoskeletal: Normal range of motion.  Neurological: She is alert.  Skin: Skin is warm and dry.  Psychiatric: Her affect is blunt. Her speech is delayed. She is slowed. Thought content is paranoid and delusional. Cognition and memory are normal. She expresses inappropriate judgment. She expresses no homicidal and no suicidal ideation.    Review of Systems  Constitutional: Negative.   HENT: Negative.   Eyes: Negative.   Respiratory: Negative.   Cardiovascular: Negative.   Gastrointestinal: Negative.   Musculoskeletal: Negative.   Skin: Negative.   Neurological: Negative.   Psychiatric/Behavioral: Positive for hallucinations and substance abuse. Negative for depression, memory loss and suicidal ideas. The patient is nervous/anxious. The patient does not have insomnia.     Blood pressure 109/75,  temperature 97.9 F (36.6 C), temperature source Oral, last menstrual period 06/02/2018, SpO2 98 %.There is no height or weight on file to calculate BMI.  General Appearance: Casual  Eye Contact:  Fair  Speech:  Clear and Coherent  Volume:  Decreased  Mood:  Euthymic  Affect:  Flat  Thought Process:  Goal Directed  Orientation:  Full (Time, Place, and Person)  Thought Content:  Delusions and Paranoid Ideation  Suicidal Thoughts:  No  Homicidal Thoughts:  No  Memory:  Immediate;   Fair Recent;   Fair Remote;   Fair  Judgement:  Fair  Insight:  Shallow  Psychomotor Activity:  Decreased  Concentration:  Concentration: Poor  Recall:  [employer] of Knowledge:  Fair  Language:  Fair  Akathisia:  No  Handed:  Right  AIMS (if indicated):      Assets:  Desire for Improvement Housing Physical Health  ADL's:  Impaired  Cognition:  Impaired,  Mild  Sleep:        Treatment Plan Summary: Plan 22 year old woman with a psychosis characterized by a persistent and intense delusion.  Patient is back in the emergency room with identical symptoms as previously.  I talked with her for a while and could not identify any new symptoms or any change in her condition.  She denies any suicidal thoughts and denies homicidal thoughts.  She has a safe place to stay and outpatient treatment.  Given the recent hospitalization it is unlikely that she would benefit from being back in the hospital here again.  Explained this to the patient.  Dr. Mamie Nick had spoken to the family earlier today and recommended that the patient be taken directly [facility] if she had to be hospitalized again.  I would also endorse they consider this but at this point we do not need to keep her at our emergency room.  Case discussed with emergency room doctor and TTS and patient who is agreeable.  Disposition: No evidence of imminent risk to self or others at present.   Patient does not meet criteria for psychiatric inpatient admission. Supportive therapy provided about ongoing stressors.  Alethia Berthold, MD 06/14/2018 6:49 PM

## 2018-06-14 NOTE — ED Notes (Signed)
Pt informed that she can not go outside and smoke a cigarette. Informed about nicotine patch. Agreeable to that.

## 2018-06-14 NOTE — ED Notes (Addendum)
Pt belongings: purple sweater, blue leopard print leggings, leopard print flats, pink backpack. Pink Bra. 1 ring.   Denies cell phone.

## 2018-06-14 NOTE — Discharge Instructions (Addendum)
If you have any thoughts of hurting yourself or anyone else return to the emergency department.
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
## 2018-07-20 ENCOUNTER — Encounter: Payer: Self-pay | Admitting: Emergency Medicine

## 2018-07-20 ENCOUNTER — Emergency Department
Admission: EM | Admit: 2018-07-20 | Discharge: 2018-07-21 | Disposition: A | Discharge status: Home / Self Care | Payer: BLUE CROSS/BLUE SHIELD | Attending: Emergency Medicine | Admitting: Emergency Medicine

## 2018-07-20 ENCOUNTER — Other Ambulatory Visit: Payer: Self-pay

## 2018-07-20 DIAGNOSIS — R45851 Suicidal ideations: Secondary | ICD-10-CM | POA: Insufficient documentation

## 2018-07-20 DIAGNOSIS — F17210 Nicotine dependence, cigarettes, uncomplicated: Secondary | ICD-10-CM | POA: Diagnosis not present

## 2018-07-20 DIAGNOSIS — Z79899 Other long term (current) drug therapy: Secondary | ICD-10-CM | POA: Diagnosis not present

## 2018-07-20 DIAGNOSIS — Z046 Encounter for general psychiatric examination, requested by authority: Secondary | ICD-10-CM | POA: Diagnosis present

## 2018-07-20 DIAGNOSIS — F250 Schizoaffective disorder, bipolar type: Secondary | ICD-10-CM | POA: Diagnosis not present

## 2018-07-20 DIAGNOSIS — Z7984 Long term (current) use of oral hypoglycemic drugs: Secondary | ICD-10-CM | POA: Diagnosis not present

## 2018-07-20 LAB — CBC WITH DIFFERENTIAL/PLATELET

## 2018-07-20 LAB — DIFFERENTIAL
BLASTS: 0 %
Band Neutrophils: 0 %
Basophils Absolute: 0.1 10*3/uL (ref 0.0–0.1)
Basophils Relative: 1 %
EOS ABS: 0.1 10*3/uL (ref 0.0–0.5)
EOS PCT: 1 %
Lymphocytes Relative: 14 %
Lymphs Abs: 2.1 10*3/uL (ref 0.7–4.0)
METAMYELOCYTES PCT: 0 %
MYELOCYTES: 0 %
Monocytes Absolute: 1 10*3/uL (ref 0.1–1.0)
Monocytes Relative: 7 %
NRBC: 0 /100{WBCs}
Neutro Abs: 11.5 10*3/uL — ABNORMAL HIGH (ref 1.7–7.7)
Neutrophils Relative %: 77 %
OTHER: 0 %
Promyelocytes Relative: 0 %

## 2018-07-20 LAB — URINE DRUG SCREEN, QUALITATIVE (ARMC ONLY)
AMPHETAMINES, UR SCREEN: NOT DETECTED
BARBITURATES, UR SCREEN: NOT DETECTED
BENZODIAZEPINE, UR SCRN: NOT DETECTED
Cannabinoid 50 Ng, Ur ~~LOC~~: POSITIVE — AB
Cocaine Metabolite,Ur ~~LOC~~: NOT DETECTED
MDMA (Ecstasy)Ur Screen: NOT DETECTED
METHADONE SCREEN, URINE: NOT DETECTED
Opiate, Ur Screen: NOT DETECTED
Phencyclidine (PCP) Ur S: NOT DETECTED
TRICYCLIC, UR SCREEN: NOT DETECTED

## 2018-07-20 LAB — COMPREHENSIVE METABOLIC PANEL
ALBUMIN: 4.1 g/dL (ref 3.5–5.0)
ALK PHOS: 76 U/L (ref 38–126)
ALT: 10 U/L (ref 0–44)
AST: 19 U/L (ref 15–41)
Anion gap: 12 (ref 5–15)
BILIRUBIN TOTAL: 0.4 mg/dL (ref 0.3–1.2)
BUN: 9 mg/dL (ref 6–20)
CALCIUM: 9.6 mg/dL (ref 8.9–10.3)
CO2: 23 mmol/L (ref 22–32)
CREATININE: 0.7 mg/dL (ref 0.44–1.00)
Chloride: 102 mmol/L (ref 98–111)
GFR calc Af Amer: >60 mL/min (ref >60)
GLUCOSE: 129 mg/dL — AB (ref 70–99)
Potassium: 3.9 mmol/L (ref 3.5–5.1)
Sodium: 137 mmol/L (ref 135–145)
TOTAL PROTEIN: 7.4 g/dL (ref 6.5–8.1)

## 2018-07-20 LAB — URINALYSIS, COMPLETE (UACMP) WITH MICROSCOPIC
BILIRUBIN URINE: NEGATIVE
GLUCOSE, UA: NEGATIVE mg/dL
HGB URINE DIPSTICK: NEGATIVE
KETONES UR: NEGATIVE mg/dL
LEUKOCYTES UA: NEGATIVE
NITRITE: NEGATIVE
PH: 7 (ref 5.0–8.0)
Protein, ur: NEGATIVE mg/dL
SPECIFIC GRAVITY, URINE: 1.006 (ref 1.005–1.030)

## 2018-07-20 LAB — CBC
HCT: 41.3 % (ref 36.0–46.0)
Hemoglobin: 13.5 g/dL (ref 12.0–15.0)
MCH: 30.5 pg (ref 26.0–34.0)
MCHC: 32.7 g/dL (ref 30.0–36.0)
MCV: 93.4 fL (ref 80.0–100.0)
NRBC: 0 % (ref 0.0–0.2)
PLATELETS: 371 10*3/uL (ref 150–400)
RBC: 4.42 MIL/uL (ref 3.87–5.11)
RDW: 12.1 % (ref 11.5–15.5)
WBC: 14.8 10*3/uL — ABNORMAL HIGH (ref 4.0–10.5)

## 2018-07-20 LAB — ETHANOL

## 2018-07-20 LAB — SALICYLATE LEVEL: Salicylate Lvl: <7 mg/dL (ref 2.8–30.0)

## 2018-07-20 LAB — LITHIUM LEVEL: LITHIUM LVL: 0.37 mmol/L — AB (ref 0.60–1.20)

## 2018-07-20 LAB — ACETAMINOPHEN LEVEL

## 2018-07-20 LAB — PROTIME-INR
INR: 0.88
Prothrombin Time: 11.9 seconds (ref 11.4–15.2)

## 2018-07-20 MED ORDER — CLONAZEPAM 0.5 MG PO TABS: 0.5000 mg | ORAL_TABLET | Freq: Two times a day (BID) | ORAL | Status: DC | PRN

## 2018-07-20 MED ORDER — METOPROLOL TARTRATE 25 MG PO TABS
25.0000 mg | ORAL_TABLET | Freq: Two times a day (BID) | ORAL | Status: DC
  Administered 2018-07-20: 25 mg via ORAL
  Filled 2018-07-20: qty 1

## 2018-07-20 MED ORDER — IPRATROPIUM BROMIDE 0.06 % NA SOLN
2.0000 | Freq: Every day | NASAL | Status: DC
  Administered 2018-07-20: 2 via NASAL
  Filled 2018-07-20: qty 15

## 2018-07-20 MED ORDER — CLOZAPINE 100 MG PO TABS
400.0000 mg | ORAL_TABLET | Freq: Every day | ORAL | Status: DC
  Administered 2018-07-20: 400 mg via ORAL
  Filled 2018-07-20: qty 4

## 2018-07-20 MED ORDER — METFORMIN HCL 500 MG PO TABS: 500.0000 mg | ORAL_TABLET | Freq: Every day | ORAL | Status: DC

## 2018-07-20 MED ORDER — METFORMIN HCL 500 MG PO TABS
500.0000 mg | ORAL_TABLET | Freq: Two times a day (BID) | ORAL | Status: DC
  Administered 2018-07-21: 500 mg via ORAL
  Filled 2018-07-20: qty 1

## 2018-07-20 MED ORDER — LITHIUM CARBONATE 300 MG PO CAPS
750.0000 mg | ORAL_CAPSULE | Freq: Two times a day (BID) | ORAL | Status: DC
  Administered 2018-07-21: 750 mg via ORAL
  Filled 2018-07-20 (×2): qty 1

## 2018-07-20 MED ORDER — CLONAZEPAM 0.5 MG PO TABS
0.5000 mg | ORAL_TABLET | ORAL | Status: AC
  Administered 2018-07-20: 0.5 mg via ORAL
  Filled 2018-07-20: qty 1
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
## 2018-07-20 NOTE — ED Notes (Signed)
Vol patient moved to [facility] 5

## 2018-07-20 NOTE — ED Notes (Signed)
Hourly rounding reveals patient sleeping in room. No complaints, stable, in no acute distress. Q15 minute rounds and monitoring via Security Cameras to continue. 

## 2018-07-20 NOTE — ED Notes (Signed)
Pt dressed out by this RN and Vet, NT. Belongings include leggings, underwear, bra, shirt, hoodie, shoes, cell phone, wallet, 2 rings, and 1 nose rings.

## 2018-07-20 NOTE — ED Notes (Signed)
Nurse gave report to [facility] nurse, Patient transferred to room 5

## 2018-07-20 NOTE — Progress Notes (Signed)
MEDICATION RELATED CONSULT NOTE - INITIAL   Pharmacy Consult for Clozaril  Indication: weekly ANC monitoring   Allergies  Allergen Reactions  . Penicillins Nausea Only    Has patient had a PCN reaction causing immediate rash, facial/tongue/throat swelling, SOB or lightheadedness with hypotension:  no Has patient had a PCN reaction causing severe rash involving mucus membranes or skin necrosis:  no Has patient had a PCN reaction that required hospitalization: no Has patient had a PCN reaction occurring within the last 10 years: no If all of the above answers are "NO", then may proceed with Cephalosporin use.     Patient Measurements: Height: 5\' 4"  (162.6 cm) Weight: 160 lb (72.6 kg) IBW/kg (Calculated) : 54.7 Adjusted Body Weight:   Vital Signs: Temp: 97.8 F (36.6 C) (10/09 1932) Temp Source: Oral (10/09 1932) BP: 122/78 (10/09 1932) Pulse Rate: 80 (10/09 1932) Intake/Output from previous day: No intake/output data recorded. Intake/Output from this shift: No intake/output data recorded.  Labs: Recent Labs    07/20/18 1626  WBC DUPLICATE REQUEST  14.8*  HGB DUPLICATE REQUEST  13.5  HCT DUPLICATE REQUEST  41.3  PLT DUPLICATE REQUEST  371  CREATININE 0.70  ALBUMIN 4.1  PROT 7.4  AST 19  ALT 10  ALKPHOS 76  BILITOT 0.4   Estimated Creatinine Clearance: 106 mL/min (by C-G formula based on SCr of 0.7 mg/dL).   Microbiology: No results found for this or any previous visit (from the past 720 hour(s)).  Medical History: Past Medical History:  Diagnosis Date  . Anxiety   . Arthritis   . Bipolar disorder (HCC)   . Depression   . Major depressive disorder   . Panic disorder   . Schizophrenia (HCC)     Medications:  Scheduled:  . cloZAPine  400 mg Oral QHS  . ipratropium  2 spray Each Nare QHS  . [START ON 07/21/2018] lithium carbonate  750 mg Oral BID WC  . [START ON 07/21/2018] metFORMIN  500 mg Oral BID WC  . metoprolol tartrate  25 mg Oral BID     Assessment: 10/09:  ANC = 11.5   Goal of Therapy:  Weekly ANC monitoring   Plan:  Pt is registered with [facility] and eligible to receive clozaril. Will recheck ANC on 10/16.   Garret Teale D 07/20/2018,9:15 PM
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
## 2018-07-20 NOTE — ED Notes (Signed)
Patient assigned to appropriate care area   Introduced self to pt  Patient oriented to unit/care area: Informed that, for their safety, care areas are designed for safety and visiting and phone hours explained to patient. Patient verbalizes understanding, and verbal contract for safety obtained  Environment secured  

## 2018-07-20 NOTE — ED Notes (Signed)
Hourly rounding reveals patient in room. No complaints, stable, in no acute distress. Q15 minute rounds and monitoring via Security Cameras to continue. 

## 2018-07-20 NOTE — Consult Note (Signed)
[facility] Face-to-Face Psychiatry Consult   Reason for Consult: Consult for this 22 year old woman who comes to the emergency room seeking admission Referring Physician: McShane Patient Identification: Kimberly Boyle MRN:  [id number] Principal Diagnosis: Schizoaffective disorder, bipolar type [facility] Diagnosis:   Patient Active Problem List   Diagnosis Date Noted  . Suicidal ideation [R45.851] 07/20/2018  . Tobacco use disorder [F17.200] 05/08/2018  . Schizoaffective disorder, bipolar type ([place]) [F25.0] 05/06/2018  . Suicide attempt ([place]) [T14.91XA] 08/20/2017  . Cannabis use disorder, moderate, dependence ([place]) [F12.20] 08/20/2017  . Self-inflicted laceration of wrist [S61.519A] 08/20/2017  . Acetaminophen poisoning [T39.1X1A] 08/20/2017  . Generalized anxiety disorder [F41.1] 01/05/2017  . Mood disorder ([place]) [F39] 01/05/2017    Total Time spent with patient: 1 hour  Subjective:   Kimberly Boyle is a 22 y.o. female patient admitted with "I do not feel safe going home".  HPI: Patient interviewed chart reviewed.  22 year old woman known to the psychiatric service comes to the emergency room saying that she is terrified that a policeman is going to kidnap her and lock her in the trunk of his car.  This is exactly the same delusion that she has been having for months or may be years.  Patient says she is now determined that she will kill herself to escape from this fate.  She plans to cut herself on the wrist.  Says that she needs to be hospitalized because she does not feel safe at home.  Patient does not report any stressor outside of her delusion.  Claims that she has been compliant with all of her medicine although her lithium level is a little low.  Denies substance abuse.  Denies homicidal ideation.  Additionally she is upset about how much weight she has gained.  Social history: Lives at home with her parents.  Almost no activity outside the home.  Substance abuse history: Recurrent  problems with cannabis abuse.  Today's drug screen not yet back.  Medical history: Patient has gained a fair bit of weight and looks like she may be having a skin rash from her lithium as well.  Past history of self induced lacerations but no acute injury today.  Past Psychiatric History: Patient has been suffering from this delusion for a long period of time and attempts have been made to treat it with multiple antipsychotic medicines and mood stabilizers.  She had a very lengthy hospitalization this summer during which antipsychotics were tried and she was eventually put on the combination of clozapine and lithium.  Ultimately it was thought that she was stable enough for discharge although she never completely got over her delusion.  Today it is back just as much as it ever was.  She does have a history of suicide attempts and self-mutilation.  Risk to Self:   Risk to Others:   Prior Inpatient Therapy:   Prior Outpatient Therapy:    Past Medical History:  Past Medical History:  Diagnosis Date  . Anxiety   . Arthritis   . Bipolar disorder ([place])   . Depression   . Major depressive disorder   . Panic disorder   . Schizophrenia ([place])    History reviewed. No pertinent surgical history. Family History:  Family History  Problem Relation Age of Onset  . Post-traumatic stress disorder Mother   . Depression Mother   . Depression Maternal Uncle   . Cancer Maternal Grandmother    Family Psychiatric  History: Unknown Social History:  Social History   Substance and  Sexual Activity  Alcohol Use No  . Frequency: Never     Social History   Substance and Sexual Activity  Drug Use No  . Types: Marijuana    Social History   Socioeconomic History  . Marital status: Single    Spouse name: Not on file  . Number of children: 0  . Years of education: Not on file  . Highest education level: High school graduate  Occupational History    Comment: unemployed  Social Needs  . Financial  resource strain: Not on file  . Food insecurity:    Worry: Not on file    Inability: Not on file  . Transportation needs:    Medical: No    Non-medical: No  Tobacco Use  . Smoking status: Current Every Day Smoker    Packs/day: 0.50    Types: Cigarettes  . Smokeless tobacco: Never Used  Substance and Sexual Activity  . Alcohol use: No    Frequency: Never  . Drug use: No    Types: Marijuana  . Sexual activity: Not Currently  Lifestyle  . Physical activity:    Days per week: 0 days    Minutes per session: 0 min  . Stress: To some extent  Relationships  . Social connections:    Talks on phone: Not on file    Gets together: Not on file    Attends religious service: Never    Active member of club or organization: No    Attends meetings of clubs or organizations: Never    Relationship status: Never married  Other Topics Concern  . Not on file  Social History Narrative   ** Merged History Encounter **       Additional Social History:    Allergies:   Allergies  Allergen Reactions  . Penicillins Nausea Only    Has patient had a PCN reaction causing immediate rash, facial/tongue/throat swelling, SOB or lightheadedness with hypotension:  no Has patient had a PCN reaction causing severe rash involving mucus membranes or skin necrosis:  no Has patient had a PCN reaction that required hospitalization: no Has patient had a PCN reaction occurring within the last 10 years: no If all of the above answers are "NO", then may proceed with Cephalosporin use.     Labs:  Results for orders placed or performed during the hospital encounter of 07/20/18 (from the past 48 hour(s))  Comprehensive metabolic panel     Status: Abnormal   Collection Time: 07/20/18  4:26 PM  Result Value Ref Range   Sodium 137 135 - 145 mmol/L   Potassium 3.9 3.5 - 5.1 mmol/L   Chloride 102 98 - 111 mmol/L   CO2 23 22 - 32 mmol/L   Glucose, Bld 129 (H) 70 - 99 mg/dL   BUN 9 6 - 20 mg/dL   Creatinine, Ser  0.70 0.44 - 1.00 mg/dL   Calcium 9.6 8.9 - 10.3 mg/dL   Total Protein 7.4 6.5 - 8.1 g/dL   Albumin 4.1 3.5 - 5.0 g/dL   AST 19 15 - 41 U/L   ALT 10 0 - 44 U/L   Alkaline Phosphatase 76 38 - 126 U/L   Total Bilirubin 0.4 0.3 - 1.2 mg/dL   GFR calc non Af Amer >60 >60 mL/min   GFR calc Af Amer >60 >60 mL/min    Comment: (NOTE) The eGFR has been calculated using the CKD EPI equation. This calculation has not been validated in all clinical situations. eGFR's persistently <60  mL/min signify possible Chronic Kidney Disease.    Anion gap 12 5 - 15    Comment: Performed at [facility], [address] [place], [place] [address]  Ethanol     Status: None   Collection Time: 07/20/18  4:26 PM  Result Value Ref Range   Alcohol, Ethyl (B) <10 <10 mg/dL    Comment: (NOTE) Lowest detectable limit for serum alcohol is 10 mg/dL. For medical purposes only. Performed at [facility], [address] [place], [place] [address]   Salicylate level     Status: None   Collection Time: 07/20/18  4:26 PM  Result Value Ref Range   Salicylate Lvl <6.0 2.8 - 30.0 mg/dL    Comment: Performed at [facility], [address] [place], [place] [address]  Acetaminophen level     Status: Abnormal   Collection Time: 07/20/18  4:26 PM  Result Value Ref Range   Acetaminophen (Tylenol), Serum <10 (L) 10 - 30 ug/mL    Comment: (NOTE) Therapeutic concentrations vary significantly. A range of 10-30 ug/mL  may be an effective concentration for many patients. However, some  are best treated at concentrations outside of this range. Acetaminophen concentrations >150 ug/mL at 4 hours after ingestion  and >50 ug/mL at 12 hours after ingestion are often associated with  toxic reactions. Performed at [facility], [address] [place], [place] [address]   cbc     Status: Abnormal   Collection Time: 07/20/18  4:26 PM  Result Value Ref Range   WBC 14.8 (H) 4.0 - 10.5 K/uL    RBC 4.42 3.87 - 5.11 MIL/uL   Hemoglobin 13.5 12.0 - 15.0 g/dL   HCT 41.3 36.0 - 46.0 %   MCV 93.4 80.0 - 100.0 fL   MCH 30.5 26.0 - 34.0 pg   MCHC 32.7 30.0 - 36.0 g/dL   RDW 12.1 11.5 - 15.5 %   Platelets 371 150 - 400 K/uL   nRBC 0.0 0.0 - 0.2 %    Comment: Performed at [facility], [address] [place], [place] [address]  Lithium level     Status: Abnormal   Collection Time: 07/20/18  4:26 PM  Result Value Ref Range   Lithium Lvl 0.37 (L) 0.60 - 1.20 mmol/L    Comment: Performed at [facility], [address] [place], [place] [address]    Current Facility-Administered Medications  Medication Dose Route Frequency Provider Last Rate Last Dose  . clonazePAM (KLONOPIN) tablet 0.5 mg  0.5 mg Oral NOW Oval Moralez T, MD      . clonazePAM (KLONOPIN) tablet 0.5 mg  0.5 mg Oral BID PRN Louise Rawson, Madie Reno, MD      . cloZAPine (CLOZARIL) tablet 400 mg  400 mg Oral QHS Shiza Thelen T, MD      . ipratropium (ATROVENT) 0.06 % nasal spray 2 spray  2 spray Each Nare QHS Jalyssa Fleisher T, MD      . Derrill Memo ON 07/21/2018] lithium carbonate capsule 750 mg  750 mg Oral BID WC Donyelle Enyeart T, MD      . Derrill Memo ON 07/21/2018] metFORMIN (GLUCOPHAGE) tablet 500 mg  500 mg Oral BID WC Mystic Labo T, MD      . metoprolol tartrate (LOPRESSOR) tablet 25 mg  25 mg Oral BID Aaralyn Kil, Madie Reno, MD       Current Outpatient Medications  Medication Sig Dispense Refill  . clonazePAM (KLONOPIN) 0.5 MG tablet Take 1  tablet (0.5 mg total) by mouth 2 (two) times daily as needed (anxiety). 60 tablet 0  . clozapine (CLOZARIL) 200 MG tablet Take 2 tablets (400 mg total) by mouth at bedtime. If available, the patient is to take Fazaclo 400 mg nightly instead of Clozapine 400 mg nightly 60 tablet 1  . clozapine (FAZACLO) 100 MG disintegrating tablet Take 4 tablets (400 mg total) by mouth daily. 120 tablet 1  . ipratropium (ATROVENT) 0.06 % nasal spray Place 2 sprays into both nostrils at bedtime. 15 mL  12  . lithium carbonate 150 MG capsule Take 5 capsules (750 mg total) by mouth 2 (two) times daily with a meal. 300 capsule 1  . metFORMIN (GLUCOPHAGE) 500 MG tablet Take 1 tablet (500 mg total) by mouth 2 (two) times daily with a meal. 60 tablet 1`  . metoprolol tartrate (LOPRESSOR) 25 MG tablet Take 1 tablet (25 mg total) by mouth 2 (two) times daily. 60 tablet 1  . paliperidone (INVEGA SUSTENNA) 234 MG/1.5ML SUSY injection Inject 234 mg into the muscle every 28 (twenty-eight) days. Next dose on 07/06/2018 1.8 mL 1  . VIENVA 0.1-20 MG-MCG tablet TK 1 T PO DAILY  3    Musculoskeletal: Strength & Muscle Tone: within normal limits Gait & Station: normal Patient leans: N/A  Psychiatric Specialty Exam: Physical Exam  Nursing note and vitals reviewed. Constitutional: She appears well-developed and well-nourished.  HENT:  Head: Normocephalic and atraumatic.  Eyes: Pupils are equal, round, and reactive to light. Conjunctivae are normal.  Neck: Normal range of motion.  Cardiovascular: Regular rhythm and normal heart sounds.  Respiratory: Effort normal. No respiratory distress.  GI: Soft.  Musculoskeletal: Normal range of motion.  Neurological: She is alert.  Skin: Skin is warm and dry.  Psychiatric: Her mood appears anxious. Her speech is delayed. She is slowed and withdrawn. Thought content is paranoid and delusional. Cognition and memory are impaired. She expresses impulsivity. She exhibits a depressed mood. She expresses suicidal ideation. She expresses suicidal plans.    Review of Systems  Constitutional: Negative.   HENT: Negative.   Eyes: Negative.   Respiratory: Negative.   Cardiovascular: Negative.   Gastrointestinal: Negative.   Musculoskeletal: Negative.   Skin: Negative.   Neurological: Negative.   Psychiatric/Behavioral: Positive for depression, hallucinations and suicidal ideas. Negative for memory loss and substance abuse. The patient is nervous/anxious and has insomnia.      Blood pressure 116/74, pulse 94, temperature 98.8 F (37.1 C), temperature source Oral, height _0  (1.626 m), weight 72.6 kg, SpO2 96 %.Body mass index is 27.46 kg/m.  General Appearance: Disheveled  Eye Contact:  Fair  Speech:  Slow  Volume:  Decreased  Mood:  Anxious and Depressed  Affect:  Congruent and Constricted  Thought Process:  Disorganized  Orientation:  Full (Time, Place, and Person)  Thought Content:  Illogical, Delusions, Hallucinations: Auditory and Paranoid Ideation  Suicidal Thoughts:  Yes.  with intent/plan  Homicidal Thoughts:  No  Memory:  Immediate;   Fair Recent;   Poor Remote;   Fair  Judgement:  Impaired  Insight:  Lacking  Psychomotor Activity:  Decreased  Concentration:  Concentration: Poor  Recall:  Poor  Fund of Knowledge:  Fair  Language:  Fair  Akathisia:  No  Handed:  Right  AIMS (if indicated):     Assets:  Desire for Improvement Housing Physical Health  ADL's:  Impaired  Cognition:  Impaired,  Mild  Sleep:  Treatment Plan Summary: Daily contact with patient to assess and evaluate symptoms and progress in treatment, Medication management and Plan 22 year old woman with diagnosis of schizoaffective disorder who has a chronic terrifying delusion.  Now she is back in the emergency room saying she is going to kill her self.  Because of her lengthy hospitalization with minimal progress over the summer there had been talk about sending her to [facility] in the future if she needed admission.  For this reason I am not going to put orders for admission in the chart yet although I will restart all of her medicine.  We can reassess this tomorrow once more members of the treatment team are available.  For now she can stay in the emergency room continue medication continue 15-minute checks.  Disposition: Recommend psychiatric Inpatient admission when medically cleared.  Alethia Berthold, MD 07/20/2018 6:40 PM

## 2018-07-20 NOTE — ED Notes (Signed)
Patient has asked for a PRN, and wants to know is she safe here, informed her she is safe, no one can come in here without a badge and we have cameras

## 2018-07-20 NOTE — ED Notes (Signed)
Report to include Situation, Background, Assessment, and Recommendations received from [facility]. Patient alert and oriented, warm and dry, in no acute distress. Patient denies HI, VH and pain. Patient  Patient made aware of Q15 minute rounds and security cameras for their safety. Patient instructed to come to me with needs or concerns.

## 2018-07-20 NOTE — ED Triage Notes (Signed)
Pt presents to ED c/o SI. Pt states, "I'm here because I'm going to kill myself." When asked about specifics pt denies having any particular plan. States "I'd like to be admitted. I feel safe when I'm here." Pt endorses hearing voices that are threatening her. Lives with parents.

## 2018-07-20 NOTE — ED Provider Notes (Signed)
[facility] Emergency Department Provider Note  ____________________________________________   I have reviewed the triage vital signs and the nursing notes. Where available I have reviewed prior notes and, if possible and indicated, outside hospital notes.    HISTORY  Chief Complaint Suicidal     HPI Kimberly Boyle is a 22 y.o. female who presents today with thoughts of harming herself and others.  She states she is hearing voices.  She denies actually trying to harm her self.  She does have a history of self-harm in the past.  She states she is compliant with her medications.  She has not taken an overdose.  She does have a history of burning her arm with cigarettes but has not been on several months.  No recent self-harm activity.  However she cannot contract for safety.  Is hearing voices with command hallucinations. Nothing makes it better nothing makes it worse no other particular alleviating or aggravating factors no prior treatment, would like to be here is here voluntarily, has been seen by psychiatry here who agrees.  Patient denies pregnancy.  And she denies abuse sexual or otherwise.   Past Medical History:  Diagnosis Date  . Anxiety   . Arthritis   . Bipolar disorder (HCC)   . Depression   . Major depressive disorder   . Panic disorder   . Schizophrenia [facility])     Patient Active Problem List   Diagnosis Date Noted  . Suicidal ideation 07/20/2018  . Tobacco use disorder 05/08/2018  . Schizoaffective disorder, bipolar type (HCC) 05/06/2018  . Suicide attempt (HCC) 08/20/2017  . Cannabis use disorder, moderate, dependence (HCC) 08/20/2017  . Self-inflicted laceration of wrist 08/20/2017  . Acetaminophen poisoning 08/20/2017  . Generalized anxiety disorder 01/05/2017  . Mood disorder (HCC) 01/05/2017    History reviewed. No pertinent surgical history.  Prior to Admission medications   Medication Sig Start Date End Date Taking?  Authorizing Provider  clonazePAM (KLONOPIN) 0.5 MG tablet Take 1 tablet (0.5 mg total) by mouth 2 (two) times daily as needed (anxiety). 05/31/18   Pucilowska, Braulio Conte B, MD  clozapine (CLOZARIL) 200 MG tablet Take 2 tablets (400 mg total) by mouth at bedtime. If available, the patient is to take Fazaclo 400 mg nightly instead of Clozapine 400 mg nightly 06/01/18   Pucilowska, [name], MD  clozapine (FAZACLO) 100 MG disintegrating tablet Take 4 tablets (400 mg total) by mouth daily. 06/08/18   Pucilowska, [name], MD  ipratropium (ATROVENT) 0.06 % nasal spray Place 2 sprays into both nostrils at bedtime. 06/08/18   Pucilowska, Braulio Conte B, MD  lithium carbonate 150 MG capsule Take 5 capsules (750 mg total) by mouth 2 (two) times daily with a meal. 06/01/18   Pucilowska, [name], MD  metFORMIN (GLUCOPHAGE) 500 MG tablet Take 1 tablet (500 mg total) by mouth 2 (two) times daily with a meal. 06/01/18   Pucilowska, [name], MD  metoprolol tartrate (LOPRESSOR) 25 MG tablet Take 1 tablet (25 mg total) by mouth 2 (two) times daily. 06/08/18   Pucilowska, [name], MD  paliperidone (INVEGA SUSTENNA) 234 MG/1.5ML SUSY injection Inject 234 mg into the muscle every 28 (twenty-eight) days. Next dose on 07/06/2018 07/06/18   Pucilowska, Ellin Goodie, MD  VIENVA 0.1-20 MG-MCG tablet TK 1 T PO DAILY 02/10/18   [provider]    Allergies Penicillins  Family History  Problem Relation Age of Onset  . Post-traumatic stress disorder Mother   . Depression Mother   .  Depression Maternal Uncle   . Cancer Maternal Grandmother     Social History Social History   Tobacco Use  . Smoking status: Current Every Day Smoker    Packs/day: 0.50    Types: Cigarettes  . Smokeless tobacco: Never Used  Substance Use Topics  . Alcohol use: No    Frequency: Never  . Drug use: No    Types: Marijuana    Review of Systems Constitutional: No fever/chills Eyes: No visual changes. ENT: No sore throat. No stiff neck  no neck pain Cardiovascular: Denies chest pain. Respiratory: Denies shortness of breath. Gastrointestinal:   no vomiting.  No diarrhea.  No constipation. Genitourinary: Negative for dysuria. Musculoskeletal: Negative lower extremity swelling Skin: Negative for rash. Neurological: Negative for severe headaches, focal weakness or numbness.   ____________________________________________   PHYSICAL EXAM:  VITAL SIGNS: ED Triage Vitals  Enc Vitals Group     BP 07/20/18 1618 116/74     Pulse Rate 07/20/18 1618 94     Resp --      Temp 07/20/18 1618 98.8 F (37.1 C)     Temp Source 07/20/18 1618 Oral     SpO2 07/20/18 1618 96 %     Weight 07/20/18 1618 160 lb (72.6 kg)     Height 07/20/18 1618 5\' 4"  (1.626 m)     Head Circumference --      Peak Flow --      Pain Score 07/20/18 1620 0     Pain Loc --      Pain Edu? --      Excl. in GC? --     Constitutional: Alert and oriented. Well appearing and in no acute distress. Eyes: Conjunctivae are normal Head: Atraumatic HEENT: No congestion/rhinnorhea. Mucous membranes are moist.  Oropharynx non-erythematous Neck:   Nontender with no meningismus, no masses, no stridor Cardiovascular: Normal rate, regular rhythm. Grossly normal heart sounds.  Good peripheral circulation. Respiratory: Normal respiratory effort.  No retractions. Lungs CTAB. Abdominal: Soft and nontender. No distention. No guarding no rebound Back:  There is no focal tenderness or step off.  there is no midline tenderness there are no lesions noted. there is no CVA tenderness Musculoskeletal: No lower extremity tenderness, no upper extremity tenderness. No joint effusions, no DVT signs strong distal pulses no edema Neurologic:  Normal speech and language. No gross focal neurologic deficits are appreciated.  Skin:  Skin is warm, dry and intact. No rash noted. Psychiatric: Mood and affect are what bizarre. Speech and behavior are  normal.  ____________________________________________   LABS (all labs ordered are listed, but only abnormal results are displayed)  Labs Reviewed  COMPREHENSIVE METABOLIC PANEL - Abnormal; Notable for the following components:      Result Value   Glucose, Bld 129 (*)    All other components within normal limits  ACETAMINOPHEN LEVEL - Abnormal; Notable for the following components:   Acetaminophen (Tylenol), Serum <10 (*)    All other components within normal limits  CBC - Abnormal; Notable for the following components:   WBC 14.8 (*)    All other components within normal limits  LITHIUM LEVEL - Abnormal; Notable for the following components:   Lithium Lvl 0.37 (*)    All other components within normal limits  ETHANOL  SALICYLATE LEVEL  URINE DRUG SCREEN, QUALITATIVE ([facility] ONLY)  URINALYSIS, COMPLETE (UACMP) WITH MICROSCOPIC  PROTIME-INR  POC URINE PREG, ED    Pertinent labs  results that were available during my care of the  patient were reviewed by me and considered in my medical decision making (see chart for details). ____________________________________________  EKG  I personally interpreted any EKGs ordered by me or triage  ____________________________________________  RADIOLOGY  Pertinent labs & imaging results that were available during my care of the patient were reviewed by me and considered in my medical decision making (see chart for details). If possible, patient and/or family made aware of any abnormal findings.  No results found. ____________________________________________    PROCEDURES  Procedure(s) performed: None  Procedures  Critical Care performed: None  ____________________________________________   INITIAL IMPRESSION / ASSESSMENT AND PLAN / ED COURSE  Pertinent labs & imaging results that were available during my care of the patient were reviewed by me and considered in my medical decision making (see chart for details).  Patient  here with suicidal thoughts, lithium level somewhat low, she has had no evidence of recent self-harm behaviors.  We will check her Coumadin level she is on Coumadin, we will restart her home medications after that value is back.  She denies pregnancy, will check a pregnancy test is not given his urinalysis.  Been evaluated by psychiatry they are keeping her here voluntarily.    ____________________________________________   FINAL CLINICAL IMPRESSION(S) / ED DIAGNOSES  Final diagnoses:  None      This chart was dictated using voice recognition software.  Despite best efforts to proofread,  errors can occur which can change meaning.      Jeanmarie Plant, MD 07/20/18 1850

## 2018-07-20 NOTE — ED Notes (Signed)
Patient can be transferred to [facility] per Dr. Mcshane.  

## 2018-07-21 LAB — PREGNANCY, URINE: Preg Test, Ur: NEGATIVE

## 2018-07-21 MED ORDER — ONDANSETRON 4 MG PO TBDP
4.0000 mg | ORAL_TABLET | Freq: Once | ORAL | Status: AC
  Administered 2018-07-21: 4 mg via ORAL
  Filled 2018-07-21 (×2): qty 1

## 2018-07-21 NOTE — ED Notes (Signed)
Patient asleep in room. No noted distress or abnormal behavior. Will continue 15 minute checks and observation by security cameras for safety. 

## 2018-07-21 NOTE — ED Provider Notes (Signed)
 -----------------------------------------   3:40 PM on 07/21/2018 -----------------------------------------  Case discussed with Dr. Toni Amend after his evaluation.  Patient now feels better after taking her medicines last night.  Medically and psychiatrically stable and suitable for discharge home and outpatient follow-up.   Sharman Cheek, MD 07/21/18 1540

## 2018-07-21 NOTE — ED Notes (Signed)
Hourly rounding reveals patient sleeping in room. No complaints, stable, in no acute distress. Q15 minute rounds and monitoring via Security Cameras to continue. 

## 2018-07-21 NOTE — ED Notes (Signed)
VOL  PENDING  PLACEMENT 

## 2018-07-21 NOTE — ED Notes (Signed)
Pt discharged to mother in lobby. VS stable. Pt denies SI. All belongings returned to patient. Discharge paperwork reviewed with patient. Pt signed for her discharge.

## 2018-07-21 NOTE — ED Notes (Signed)
Pt. Up to rest room. Vomiting X 1.

## 2018-07-21 NOTE — Consult Note (Signed)
[facility] Consult   Reason for Consult: Consult for 22 year old woman.  Follow-up on yesterday's consult. Referring Physician: Joni Fears Patient Identification: Kimberly Boyle MRN:  [id number] Principal Diagnosis: Schizoaffective disorder, bipolar type [facility]) Diagnosis:   Patient Active Problem List   Diagnosis Date Noted  . Suicidal ideation [R45.851] 07/20/2018  . Tobacco use disorder [F17.200] 05/08/2018  . Schizoaffective disorder, bipolar type ([place]) [F25.0] 05/06/2018  . Suicide attempt ([place]) [T14.91XA] 08/20/2017  . Cannabis use disorder, moderate, dependence ([place]) [F12.20] 08/20/2017  . Self-inflicted laceration of wrist [S61.519A] 08/20/2017  . Acetaminophen poisoning [T39.1X1A] 08/20/2017  . Generalized anxiety disorder [F41.1] 01/05/2017  . Mood disorder ([place]) [F39] 01/05/2017    Total Time spent with patient: 30 minutes  Subjective:   Kimberly Boyle is a 22 y.o. female patient admitted with "I guess I am okay".  HPI: See yesterday's consult note.  Patient well-known to the psychiatric service.  22 year old woman with persistent delusions who came to the emergency room yesterday voicing suicidal ideation.  We had no bed available yesterday and waited overnight to reevaluate her.  Patient was given her usual medicine.  She slept well last night and on evaluation today says that while her mood is still not so good she no longer has any thoughts about wanting to die or wanting to kill herself.  When asked about her delusion it is still present but she did not spontaneously start talking about it.  Patient says she feels safe and is willing to go back home and follow up with outpatient treatment.  Past Psychiatric History: Patient has a history of persistent delusion with hallucinations that has been remarkably resistant to all modes of treatment so far.  She is now following up with an outpatient provider.  Risk to Self:   Risk to Others:   Prior Inpatient  Therapy:   Prior Outpatient Therapy:    Past Medical History:  Past Medical History:  Diagnosis Date  . Anxiety   . Arthritis   . Bipolar disorder ([place])   . Depression   . Major depressive disorder   . Panic disorder   . Schizophrenia ([place])    History reviewed. No pertinent surgical history. Family History:  Family History  Problem Relation Age of Onset  . Post-traumatic stress disorder Mother   . Depression Mother   . Depression Maternal Uncle   . Cancer Maternal Grandmother    Family Psychiatric  History: Unknown Social History:  Social History   Substance and Sexual Activity  Alcohol Use No  . Frequency: Never     Social History   Substance and Sexual Activity  Drug Use No  . Types: Marijuana    Social History   Socioeconomic History  . Marital status: Single    Spouse name: Not on file  . Number of children: 0  . Years of education: Not on file  . Highest education level: High school graduate  Occupational History    Comment: unemployed  Social Needs  . Financial resource strain: Not on file  . Food insecurity:    Worry: Not on file    Inability: Not on file  . Transportation needs:    Medical: No    Non-medical: No  Tobacco Use  . Smoking status: Current Every Day Smoker    Packs/day: 0.50    Types: Cigarettes  . Smokeless tobacco: Never Used  Substance and Sexual Activity  . Alcohol use: No    Frequency: Never  . Drug use: No  Types: Marijuana  . Sexual activity: Not Currently  Lifestyle  . Physical activity:    Days per week: 0 days    Minutes per session: 0 min  . Stress: To some extent  Relationships  . Social connections:    Talks on phone: Not on file    Gets together: Not on file    Attends religious service: Never    Active member of club or organization: No    Attends meetings of clubs or organizations: Never    Relationship status: Never married  Other Topics Concern  . Not on file  Social History Narrative   ** Merged  History Encounter **       Additional Social History:    Allergies:   Allergies  Allergen Reactions  . Penicillins Nausea Only    Has patient had a PCN reaction causing immediate rash, facial/tongue/throat swelling, SOB or lightheadedness with hypotension:  no Has patient had a PCN reaction causing severe rash involving mucus membranes or skin necrosis:  no Has patient had a PCN reaction that required hospitalization: no Has patient had a PCN reaction occurring within the last 10 years: no If all of the above answers are "NO", then may proceed with Cephalosporin use.     Labs:  Results for orders placed or performed during the hospital encounter of 07/20/18 (from the past 48 hour(s))  Comprehensive metabolic panel     Status: Abnormal   Collection Time: 07/20/18  4:26 PM  Result Value Ref Range   Sodium 137 135 - 145 mmol/L   Potassium 3.9 3.5 - 5.1 mmol/L   Chloride 102 98 - 111 mmol/L   CO2 23 22 - 32 mmol/L   Glucose, Bld 129 (H) 70 - 99 mg/dL   BUN 9 6 - 20 mg/dL   Creatinine, Ser 0.70 0.44 - 1.00 mg/dL   Calcium 9.6 8.9 - 10.3 mg/dL   Total Protein 7.4 6.5 - 8.1 g/dL   Albumin 4.1 3.5 - 5.0 g/dL   AST 19 15 - 41 U/L   ALT 10 0 - 44 U/L   Alkaline Phosphatase 76 38 - 126 U/L   Total Bilirubin 0.4 0.3 - 1.2 mg/dL   GFR calc non Af Amer >60 >60 mL/min   GFR calc Af Amer >60 >60 mL/min    Comment: (NOTE) The eGFR has been calculated using the CKD EPI equation. This calculation has not been validated in all clinical situations. eGFR's persistently <60 mL/min signify possible Chronic Kidney Disease.    Anion gap 12 5 - 15    Comment: Performed at [facility], [address] [place], [place] [address]  Ethanol     Status: None   Collection Time: 07/20/18  4:26 PM  Result Value Ref Range   Alcohol, Ethyl (B) <10 <10 mg/dL    Comment: (NOTE) Lowest detectable limit for serum alcohol is 10 mg/dL. For medical purposes only. Performed at [facility], [address] [place], [place] [address]   Salicylate level     Status: None   Collection Time: 07/20/18  4:26 PM  Result Value Ref Range   Salicylate Lvl <7.8 2.8 - 30.0 mg/dL    Comment: Performed at [facility], [address] [place], [place] [address]  Acetaminophen level     Status: Abnormal   Collection Time: 07/20/18  4:26 PM  Result Value Ref Range   Acetaminophen (Tylenol), Serum <10 (L) 10 - 30 ug/mL    Comment: (  NOTE) Therapeutic concentrations vary significantly. A range of 10-30 ug/mL  may be an effective concentration for many patients. However, some  are best treated at concentrations outside of this range. Acetaminophen concentrations >150 ug/mL at 4 hours after ingestion  and >50 ug/mL at 12 hours after ingestion are often associated with  toxic reactions. Performed at [facility], [address] [place], [place] [address]   cbc     Status: Abnormal   Collection Time: 07/20/18  4:26 PM  Result Value Ref Range   WBC 14.8 (H) 4.0 - 10.5 K/uL   RBC 4.42 3.87 - 5.11 MIL/uL   Hemoglobin 13.5 12.0 - 15.0 g/dL   HCT 41.3 36.0 - 46.0 %   MCV 93.4 80.0 - 100.0 fL   MCH 30.5 26.0 - 34.0 pg   MCHC 32.7 30.0 - 36.0 g/dL   RDW 12.1 11.5 - 15.5 %   Platelets 371 150 - 400 K/uL   nRBC 0.0 0.0 - 0.2 %    Comment: Performed at [facility], [address] [place], [place] [address]  Lithium level     Status: Abnormal   Collection Time: 07/20/18  4:26 PM  Result Value Ref Range   Lithium Lvl 0.37 (L) 0.60 - 1.20 mmol/L    Comment: Performed at [facility], [address], [place], [place] [address]  CBC with Differential/Platelet     Status: None (Preliminary result)   Collection Time: 07/20/18  4:26 PM  Result Value Ref Range   WBC DUPLICATE REQUEST 4.0 - 81.0 K/uL   RBC DUPLICATE REQUEST 1.75 - 5.11 MIL/uL   Hemoglobin DUPLICATE REQUEST 10.2 - 58.5 g/dL   HCT DUPLICATE REQUEST 27.7 - 82.4 %   MCV DUPLICATE REQUEST  23.5 - 361.4 fL   MCH DUPLICATE REQUEST 43.1 - 54.0 pg   MCHC DUPLICATE REQUEST 08.6 - 76.1 g/dL   RDW DUPLICATE REQUEST 95.0 - 15.5 %   Platelets DUPLICATE REQUEST 932 - 671 K/uL   nRBC DUPLICATE REQUEST 0.0 - 0.2 %   Neutrophils Relative % PENDING %   Neutro Abs PENDING 1.7 - 7.7 K/uL   Band Neutrophils PENDING %   Lymphocytes Relative PENDING %   Lymphs Abs PENDING 0.7 - 4.0 K/uL   Monocytes Relative PENDING %   Monocytes Absolute PENDING 0.1 - 1.0 K/uL   Eosinophils Relative PENDING %   Eosinophils Absolute PENDING 0.0 - 0.5 K/uL   Basophils Relative PENDING %   Basophils Absolute PENDING 0.0 - 0.1 K/uL   WBC Morphology PENDING    RBC Morphology PENDING    Smear Review PENDING    Other PENDING %   nRBC PENDING 0 /100 WBC   Metamyelocytes Relative PENDING %   Myelocytes PENDING %   Promyelocytes Relative PENDING %   Blasts PENDING %  Differential     Status: Abnormal   Collection Time: 07/20/18  4:26 PM  Result Value Ref Range   Neutrophils Relative % 77 %   Neutro Abs 11.5 (H) 1.7 - 7.7 K/uL   Lymphocytes Relative 14 %   Lymphs Abs 2.1 0.7 - 4.0 K/uL   Monocytes Relative 7 %   Monocytes Absolute 1.0 0.1 - 1.0 K/uL   Eosinophils Relative 1 %   Basophils Relative 1 %   Basophils Absolute 0.1 0.0 - 0.1 K/uL   Band Neutrophils 0 %   Metamyelocytes Relative 0 %   Myelocytes 0 %   Promyelocytes Relative 0 %   Blasts  0 %   nRBC 0 0 /100 WBC   Other 0 %   Eosinophils Absolute 0.1 0.0 - 0.5 K/uL    Comment: Performed at [facility], [address] [place], [place] [address] CORRECTED ON 10/09 AT 2124: PREVIOUSLY REPORTED AS 0.2   Protime-INR     Status: None   Collection Time: 07/20/18  6:46 PM  Result Value Ref Range   Prothrombin Time 11.9 11.4 - 15.2 seconds   INR 0.88     Comment: Performed at [facility], [address], [place], [place] [address]  Urine Drug Screen, Qualitative     Status: Abnormal   Collection Time: 07/20/18  7:14 PM   Result Value Ref Range   Tricyclic, Ur Screen NONE DETECTED NONE DETECTED   Amphetamines, Ur Screen NONE DETECTED NONE DETECTED   MDMA (Ecstasy)Ur Screen NONE DETECTED NONE DETECTED   Cocaine Metabolite,Ur [place] NONE DETECTED NONE DETECTED   Opiate, Ur Screen NONE DETECTED NONE DETECTED   Phencyclidine (PCP) Ur S NONE DETECTED NONE DETECTED   Cannabinoid 50 Ng, Ur [place] POSITIVE (A) NONE DETECTED   Barbiturates, Ur Screen NONE DETECTED NONE DETECTED   Benzodiazepine, Ur Scrn NONE DETECTED NONE DETECTED   Methadone Scn, Ur NONE DETECTED NONE DETECTED    Comment: (NOTE) Tricyclics + metabolites, urine    Cutoff 1000 ng/mL Amphetamines + metabolites, urine  Cutoff 1000 ng/mL MDMA (Ecstasy), urine              Cutoff 500 ng/mL Cocaine Metabolite, urine          Cutoff 300 ng/mL Opiate + metabolites, urine        Cutoff 300 ng/mL Phencyclidine (PCP), urine         Cutoff 25 ng/mL Cannabinoid, urine                 Cutoff 50 ng/mL Barbiturates + metabolites, urine  Cutoff 200 ng/mL Benzodiazepine, urine              Cutoff 200 ng/mL Methadone, urine                   Cutoff 300 ng/mL The urine drug screen provides only a preliminary, unconfirmed analytical test result and should not be used for non-medical purposes. Clinical consideration and professional judgment should be applied to any positive drug screen result due to possible interfering substances. A more specific alternate chemical method must be used in order to obtain a confirmed analytical result. Gas chromatography / mass spectrometry (GC/MS) is the preferred confirmat ory method. Performed at [facility], [address] [place], [place] [address]   Urinalysis, Complete w Microscopic     Status: Abnormal   Collection Time: 07/20/18  7:14 PM  Result Value Ref Range   Color, Urine STRAW (A) YELLOW   APPearance CLEAR (A) CLEAR   Specific Gravity, Urine 1.006 1.005 - 1.030   pH 7.0 5.0 - 8.0   Glucose, UA NEGATIVE  NEGATIVE mg/dL   Hgb urine dipstick NEGATIVE NEGATIVE   Bilirubin Urine NEGATIVE NEGATIVE   Ketones, ur NEGATIVE NEGATIVE mg/dL   Protein, ur NEGATIVE NEGATIVE mg/dL   Nitrite NEGATIVE NEGATIVE   Leukocytes, UA NEGATIVE NEGATIVE   RBC / HPF 0-5 0 - 5 RBC/hpf   WBC, UA 0-5 0 - 5 WBC/hpf   Bacteria, UA RARE (A) NONE SEEN   Squamous Epithelial / LPF 6-10 0 - 5    Comment: Performed at [facility], [address] [place], [place] [address]  Pregnancy, urine     Status: None   Collection Time: 07/20/18  7:14 PM  Result Value Ref Range   Preg Test, Ur NEGATIVE NEGATIVE    Comment: Performed at [facility], [address] [place], [place] [address]    Current Facility-Administered Medications  Medication Dose Route Frequency Provider Last Rate Last Dose  . clonazePAM (KLONOPIN) tablet 0.5 mg  0.5 mg Oral BID PRN Vedanth Sirico T, MD      . cloZAPine (CLOZARIL) tablet 400 mg  400 mg Oral QHS Jaeley Wiker T, MD   400 mg at 07/20/18 2120  . ipratropium (ATROVENT) 0.06 % nasal spray 2 spray  2 spray Each Nare QHS Jaquil Todt, Madie Reno, MD   2 spray at 07/20/18 2119  . lithium carbonate capsule 750 mg  750 mg Oral BID WC Cordell Guercio, Madie Reno, MD   750 mg at 07/21/18 0944  . metFORMIN (GLUCOPHAGE) tablet 500 mg  500 mg Oral BID WC Kyrra Prada, Madie Reno, MD   500 mg at 07/21/18 0944  . metoprolol tartrate (LOPRESSOR) tablet 25 mg  25 mg Oral BID Nikia Levels, Madie Reno, MD   25 mg at 07/20/18 2121   Current Outpatient Medications  Medication Sig Dispense Refill  . clonazePAM (KLONOPIN) 0.5 MG tablet Take 1 tablet (0.5 mg total) by mouth 2 (two) times daily as needed (anxiety). 60 tablet 0  . cloZAPine (CLOZARIL) 100 MG tablet Take 400 mg by mouth at bedtime.  5  . lithium 300 MG tablet Take 750 mg by mouth 2 (two) times daily.  4  . metFORMIN (GLUCOPHAGE) 500 MG tablet Take 1 tablet (500 mg total) by mouth 2 (two) times daily with a meal. 60 tablet 1`  . metoprolol tartrate (LOPRESSOR) 25 MG  tablet Take 1 tablet (25 mg total) by mouth 2 (two) times daily. 60 tablet 1  . clozapine (CLOZARIL) 200 MG tablet Take 2 tablets (400 mg total) by mouth at bedtime. If available, the patient is to take Fazaclo 400 mg nightly instead of Clozapine 400 mg nightly (Patient not taking: Reported on 07/21/2018) 60 tablet 1  . clozapine (FAZACLO) 100 MG disintegrating tablet Take 4 tablets (400 mg total) by mouth daily. (Patient not taking: Reported on 07/21/2018) 120 tablet 1  . ipratropium (ATROVENT) 0.06 % nasal spray Place 2 sprays into both nostrils at bedtime. 15 mL 12  . lithium carbonate 150 MG capsule Take 5 capsules (750 mg total) by mouth 2 (two) times daily with a meal. (Patient not taking: Reported on 07/21/2018) 300 capsule 1  . paliperidone (INVEGA SUSTENNA) 234 MG/1.5ML SUSY injection Inject 234 mg into the muscle every 28 (twenty-eight) days. Next dose on 07/06/2018 1.8 mL 1  . VIENVA 0.1-20 MG-MCG tablet Take 1 tablet by mouth daily.   3    Musculoskeletal: Strength & Muscle Tone: within normal limits Gait & Station: normal Patient leans: N/A  Psychiatric Specialty Exam: Physical Exam  Nursing note and vitals reviewed. Constitutional: She appears well-developed and well-nourished.  HENT:  Head: Normocephalic and atraumatic.  Eyes: Pupils are equal, round, and reactive to light. Conjunctivae are normal.  Neck: Normal range of motion.  Cardiovascular: Regular rhythm and normal heart sounds.  Respiratory: Effort normal. No respiratory distress.  GI: Soft.  Musculoskeletal: Normal range of motion.  Neurological: She is alert.  Skin: Skin is warm and dry.  Psychiatric: Her affect is blunt. Her speech is delayed. She is slowed. Thought content  is paranoid and delusional. Cognition and memory are impaired. She expresses inappropriate judgment. She expresses no suicidal ideation.    Review of Systems  Constitutional: Negative.   HENT: Negative.   Eyes: Negative.   Respiratory:  Negative.   Cardiovascular: Negative.   Gastrointestinal: Negative.   Musculoskeletal: Negative.   Skin: Negative.   Neurological: Negative.   Psychiatric/Behavioral: Positive for depression and hallucinations. Negative for memory loss, substance abuse and suicidal ideas. The patient is nervous/anxious. The patient does not have insomnia.     Blood pressure (!) 98/54, pulse 74, temperature 97.8 F (36.6 C), temperature source Oral, resp. rate 15, height '5\' 4"'$  (1.626 m), weight 72.6 kg, SpO2 96 %.Body mass index is 27.46 kg/m.  General Appearance: Disheveled  Eye Contact:  Minimal  Speech:  Slow  Volume:  Decreased  Mood:  Dysphoric  Affect:  Congruent  Thought Process:  Goal Directed  Orientation:  Full (Time, Place, and Person)  Thought Content:  Logical, Delusions, Hallucinations: Auditory and Paranoid Ideation  Suicidal Thoughts:  No  Homicidal Thoughts:  No  Memory:  Immediate;   Fair Recent;   Fair  Judgement:  Impaired  Insight:  Shallow  Psychomotor Activity:  Decreased  Concentration:  Concentration: Fair  Recall:  [employer] of Knowledge:  Fair  Language:  Fair  Akathisia:  No  Handed:  Right  AIMS (if indicated):     Assets:  Desire for Improvement Housing Physical Health Resilience  ADL's:  Intact  Cognition:  WNL  Sleep:        Treatment Plan Summary: Plan Patient slept overnight and took her regular medicine.  I have a suspicion she may not have been taking her medicine regularly at home given how low her lithium level was.  Also how hard she slept last night.  In any case the patient is now calm and denies any suicidal thoughts.  Given her long hospitalization in the past without any real progress or benefit there is no need to keep her in the hospital if it is not a matter of acute safety.  I will discontinue the involuntary commitment.  Patient given psychoeducation and counseling about staying on her medicine.  Case reviewed with ER physician and  TTS.  Disposition: No evidence of imminent risk to self or others at present.   Patient does not meet criteria for psychiatric inpatient admission. Supportive therapy provided about ongoing stressors. Discussed crisis plan, support from social network, calling 911, coming to the Emergency Department, and calling Suicide Hotline.  Alethia Berthold, MD 07/21/2018 2:48 PM

## 2018-07-21 NOTE — ED Notes (Signed)
Hourly rounding reveals patient in room. No complaints, stable, in no acute distress. Q15 minute rounds and monitoring via Security Cameras to continue. 

## 2018-07-21 NOTE — Discharge Instructions (Addendum)
Continue taking your home medications and follow-up with your doctor as soon as possible.

## 2019-05-24 ENCOUNTER — Other Ambulatory Visit: Payer: Self-pay

## 2019-05-24 ENCOUNTER — Emergency Department
Admission: EM | Admit: 2019-05-24 | Discharge: 2019-05-25 | Disposition: A | Discharge status: Home / Self Care | Payer: BC Managed Care – PPO | Attending: Emergency Medicine | Admitting: Emergency Medicine

## 2019-05-24 ENCOUNTER — Encounter: Payer: Self-pay | Admitting: Emergency Medicine

## 2019-05-24 DIAGNOSIS — F17210 Nicotine dependence, cigarettes, uncomplicated: Secondary | ICD-10-CM | POA: Diagnosis not present

## 2019-05-24 DIAGNOSIS — F411 Generalized anxiety disorder: Secondary | ICD-10-CM | POA: Diagnosis present

## 2019-05-24 DIAGNOSIS — F12188 Cannabis abuse with other cannabis-induced disorder: Secondary | ICD-10-CM | POA: Diagnosis not present

## 2019-05-24 DIAGNOSIS — Z79899 Other long term (current) drug therapy: Secondary | ICD-10-CM | POA: Diagnosis not present

## 2019-05-24 DIAGNOSIS — Z7984 Long term (current) use of oral hypoglycemic drugs: Secondary | ICD-10-CM | POA: Diagnosis not present

## 2019-05-24 DIAGNOSIS — F32A Depression, unspecified: Secondary | ICD-10-CM

## 2019-05-24 DIAGNOSIS — F251 Schizoaffective disorder, depressive type: Secondary | ICD-10-CM | POA: Insufficient documentation

## 2019-05-24 DIAGNOSIS — Z046 Encounter for general psychiatric examination, requested by authority: Secondary | ICD-10-CM | POA: Diagnosis present

## 2019-05-24 DIAGNOSIS — F1220 Cannabis dependence, uncomplicated: Secondary | ICD-10-CM | POA: Diagnosis present

## 2019-05-24 DIAGNOSIS — Z20828 Contact with and (suspected) exposure to other viral communicable diseases: Secondary | ICD-10-CM | POA: Insufficient documentation

## 2019-05-24 DIAGNOSIS — R45851 Suicidal ideations: Secondary | ICD-10-CM | POA: Insufficient documentation

## 2019-05-24 DIAGNOSIS — F329 Major depressive disorder, single episode, unspecified: Secondary | ICD-10-CM

## 2019-05-24 LAB — COMPREHENSIVE METABOLIC PANEL
ALT: 19 U/L (ref 0–44)
AST: 24 U/L (ref 15–41)
Albumin: 4 g/dL (ref 3.5–5.0)
Alkaline Phosphatase: 111 U/L (ref 38–126)
Anion gap: 9 (ref 5–15)
BUN: 15 mg/dL (ref 6–20)
CO2: 21 mmol/L — ABNORMAL LOW (ref 22–32)
Calcium: 9.2 mg/dL (ref 8.9–10.3)
Chloride: 106 mmol/L (ref 98–111)
Creatinine, Ser: 0.76 mg/dL (ref 0.44–1.00)
GFR calc Af Amer: >60 mL/min (ref >60)
GFR calc non Af Amer: >60 mL/min (ref >60)
Glucose, Bld: 94 mg/dL (ref 70–99)
Potassium: 3.9 mmol/L (ref 3.5–5.1)
Sodium: 136 mmol/L (ref 135–145)
Total Bilirubin: 0.1 mg/dL — ABNORMAL LOW (ref 0.3–1.2)
Total Protein: 7.2 g/dL (ref 6.5–8.1)

## 2019-05-24 LAB — CBC
HCT: 43.6 % (ref 36.0–46.0)
Hemoglobin: 13.9 g/dL (ref 12.0–15.0)
MCH: 27.9 pg (ref 26.0–34.0)
MCHC: 31.9 g/dL (ref 30.0–36.0)
MCV: 87.6 fL (ref 80.0–100.0)
Platelets: 380 10*3/uL (ref 150–400)
RBC: 4.98 MIL/uL (ref 3.87–5.11)
RDW: 13.5 % (ref 11.5–15.5)
WBC: 12.6 10*3/uL — ABNORMAL HIGH (ref 4.0–10.5)
nRBC: 0 % (ref 0.0–0.2)

## 2019-05-24 LAB — URINE DRUG SCREEN, QUALITATIVE (ARMC ONLY)
Amphetamines, Ur Screen: NOT DETECTED
Barbiturates, Ur Screen: NOT DETECTED
Benzodiazepine, Ur Scrn: NOT DETECTED
Cannabinoid 50 Ng, Ur ~~LOC~~: POSITIVE — AB
Cocaine Metabolite,Ur ~~LOC~~: NOT DETECTED
MDMA (Ecstasy)Ur Screen: NOT DETECTED
Methadone Scn, Ur: NOT DETECTED
Opiate, Ur Screen: NOT DETECTED
Phencyclidine (PCP) Ur S: NOT DETECTED
Tricyclic, Ur Screen: NOT DETECTED

## 2019-05-24 LAB — ETHANOL: Alcohol, Ethyl (B): <10 mg/dL (ref <10)

## 2019-05-24 LAB — POCT PREGNANCY, URINE: Preg Test, Ur: NEGATIVE

## 2019-05-24 LAB — SARS CORONAVIRUS 2 BY RT PCR (HOSPITAL ORDER, PERFORMED IN ~~LOC~~ HOSPITAL LAB): SARS Coronavirus 2: NEGATIVE

## 2019-05-24 LAB — SALICYLATE LEVEL: Salicylate Lvl: <7 mg/dL (ref 2.8–30.0)

## 2019-05-24 LAB — ACETAMINOPHEN LEVEL: Acetaminophen (Tylenol), Serum: <10 ug/mL — ABNORMAL LOW (ref 10–30)

## 2019-05-24 NOTE — ED Notes (Signed)
Hourly rounding reveals patient in room. No complaints, stable, in no acute distress. Q15 minute rounds and monitoring via Rover and Officer to continue.   

## 2019-05-24 NOTE — ED Provider Notes (Signed)
[facility] Emergency Department Provider Note   ____________________________________________   First MD Initiated Contact with Patient 05/24/19 2114     (approximate)  I have reviewed the triage vital signs and the nursing notes.   HISTORY  Chief Complaint Suicidal    HPI Kimberly Boyle is a 23 y.o. female with past medical history of schizoaffective disorder who presents to the ED complaining of suicidal ideation.  Patient reports that she has been depressed recently due to the death of her mother and interpersonal issues with her father.  She states that her father kicked her out this evening and she had planned to "take all of my pills" in an effort to end her life.  She states that she never actually took any pills, instead called the police and was brought to the ED.  She is currently here voluntarily.  She denies any issues with drugs or alcohol recently.        Past Medical History:  Diagnosis Date  . Anxiety   . Arthritis   . Bipolar disorder ([place])   . Depression   . Major depressive disorder   . Panic disorder   . Schizophrenia [facility])     Patient Active Problem List   Diagnosis Date Noted  . Suicidal ideation 07/20/2018  . Tobacco use disorder 05/08/2018  . Schizoaffective disorder, bipolar type ([place]) 05/06/2018  . Suicide attempt ([place]) 08/20/2017  . Cannabis use disorder, moderate, dependence ([place]) 08/20/2017  . Self-inflicted laceration of wrist 08/20/2017  . Acetaminophen poisoning 08/20/2017  . Generalized anxiety disorder 01/05/2017  . Mood disorder ([place]) 01/05/2017    History reviewed. No pertinent surgical history.  Prior to Admission medications   Medication Sig Start Date End Date Taking? Authorizing Provider  clonazePAM (KLONOPIN) 0.5 MG tablet Take 1 tablet (0.5 mg total) by mouth 2 (two) times daily as needed (anxiety). 05/31/18   Pucilowska, Herma Ard B, MD  cloZAPine (CLOZARIL) 100 MG tablet Take 400 mg by mouth at  bedtime. 07/15/18   [provider]  clozapine (CLOZARIL) 200 MG tablet Take 2 tablets (400 mg total) by mouth at bedtime. If available, the patient is to take Fazaclo 400 mg nightly instead of Clozapine 400 mg nightly Patient not taking: Reported on 07/21/2018 06/01/18   Pucilowska, Herma Ard B, MD  clozapine (FAZACLO) 100 MG disintegrating tablet Take 4 tablets (400 mg total) by mouth daily. Patient not taking: Reported on 07/21/2018 06/08/18   Pucilowska, Herma Ard B, MD  ipratropium (ATROVENT) 0.06 % nasal spray Place 2 sprays into both nostrils at bedtime. 06/08/18   Pucilowska, Herma Ard B, MD  lithium 300 MG tablet Take 750 mg by mouth 2 (two) times daily. 07/01/18   [provider]  lithium carbonate 150 MG capsule Take 5 capsules (750 mg total) by mouth 2 (two) times daily with a meal. Patient not taking: Reported on 07/21/2018 06/01/18   Pucilowska, Herma Ard B, MD  metFORMIN (GLUCOPHAGE) 500 MG tablet Take 1 tablet (500 mg total) by mouth 2 (two) times daily with a meal. 06/01/18   Pucilowska, [name], MD  metoprolol tartrate (LOPRESSOR) 25 MG tablet Take 1 tablet (25 mg total) by mouth 2 (two) times daily. 06/08/18   Pucilowska, [name], MD  paliperidone (INVEGA SUSTENNA) 234 MG/1.5ML SUSY injection Inject 234 mg into the muscle every 28 (twenty-eight) days. Next dose on 07/06/2018 07/06/18   Pucilowska, Herma Ard B, MD  VIENVA 0.1-20 MG-MCG tablet Take 1 tablet by mouth daily.  02/10/18   [provider]    Allergies Penicillins  Family History  Problem Relation Age of Onset  . Post-traumatic stress disorder Mother   . Depression Mother   . Depression Maternal Uncle   . Cancer Maternal Grandmother     Social History Social History   Tobacco Use  . Smoking status: Current Every Day Smoker    Packs/day: 0.50    Types: Cigarettes  . Smokeless tobacco: Never Used  Substance Use Topics  . Alcohol use: No    Frequency: Never  . Drug use: No    Types: Marijuana     Review of Systems  Constitutional: No fever/chills Eyes: No visual changes. ENT: No sore throat. Cardiovascular: Denies chest pain. Respiratory: Denies shortness of breath. Gastrointestinal: No abdominal pain.  No nausea, no vomiting.  No diarrhea.  No constipation. Genitourinary: Negative for dysuria. Musculoskeletal: Negative for back pain. Skin: Negative for rash. Neurological: Negative for headaches, focal weakness or numbness.  Positive for suicidal ideation.  ____________________________________________   PHYSICAL EXAM:  VITAL SIGNS: ED Triage Vitals [05/24/19 2056]  Enc Vitals Group     BP (!) 148/94     Pulse Rate (!) 122     Resp 18     Temp 98.2 F (36.8 C)     Temp Source Oral     SpO2 97 %     Weight 160 lb 15 oz (73 kg)     Height 5\' 3"  (1.6 m)     Head Circumference      Peak Flow      Pain Score 0     Pain Loc      Pain Edu?      Excl. in GC?     Constitutional: Alert and oriented. Eyes: Conjunctivae are normal. Head: Atraumatic. Nose: No congestion/rhinnorhea. Mouth/Throat: Mucous membranes are moist. Neck: Normal ROM Cardiovascular: Normal rate, regular rhythm. Grossly normal heart sounds. Respiratory: Normal respiratory effort.  No retractions. Lungs CTAB. Gastrointestinal: Soft and nontender. No distention. Genitourinary: deferred Musculoskeletal: No lower extremity tenderness nor edema. Neurologic:  Normal speech and language. No gross focal neurologic deficits are appreciated. Skin:  Skin is warm, dry and intact. No rash noted. Psychiatric: Mood and affect are normal. Speech and behavior are normal.  ____________________________________________   LABS (all labs ordered are listed, but only abnormal results are displayed)  Labs Reviewed  COMPREHENSIVE METABOLIC PANEL - Abnormal; Notable for the following components:      Result Value   CO2 21 (*)    Total Bilirubin 0.1 (*)    All other components within normal limits   ACETAMINOPHEN LEVEL - Abnormal; Notable for the following components:   Acetaminophen (Tylenol), Serum <10 (*)    All other components within normal limits  CBC - Abnormal; Notable for the following components:   WBC 12.6 (*)    All other components within normal limits  URINE DRUG SCREEN, QUALITATIVE ([facility] ONLY) - Abnormal; Notable for the following components:   Cannabinoid 50 Ng, Ur [place] POSITIVE (*)    All other components within normal limits  SARS CORONAVIRUS 2 (HOSPITAL ORDER, PERFORMED IN [facility])  ETHANOL  SALICYLATE LEVEL  LITHIUM LEVEL  POCT PREGNANCY, URINE  POC URINE PREG, ED    PROCEDURES  Procedure(s) performed (including Critical Care):  Procedures   ____________________________________________   INITIAL IMPRESSION / ASSESSMENT AND PLAN / ED COURSE       23 year old female with history of schizoaffective disorder and prior suicide attempts presents to the  ED after having thoughts of taking all of her pills in order to end her life earlier this evening.  She is currently here voluntarily and assures me that she has no desire to leave, will hold off on IVC for now.  No apparent medical issues, will screen labs.  Plan to consult psychiatry for assistance with disposition.  Labs unremarkable, patient medically cleared.  She is pending further evaluation by psychiatry to determine appropriate disposition.      ____________________________________________   FINAL CLINICAL IMPRESSION(S) / ED DIAGNOSES  Final diagnoses:  Suicidal ideation  Depression, unspecified depression type     ED Discharge Orders    None       Note:  This document was prepared using Dragon voice recognition software and may include unintentional dictation errors.   Chesley NoonJessup, Yailin Biederman, MD 05/24/19 [phone]

## 2019-05-24 NOTE — ED Notes (Signed)
Pt was given a gingerale.

## 2019-05-24 NOTE — ED Notes (Signed)
Pt changed into burgundy scrubs and belongings placed in 3 bags (2 in belonging bags and 1 in a backpack)

## 2019-05-24 NOTE — ED Notes (Signed)
Pt. Transferred from triage to room 19H after dressing out and screening for contraband. Report to include Situation, Background, Assessment and Recommendations from RN. Pt. Oriented to Quad including Q15 minute rounds as well as Engineer, drilling for their protection. Patient is alert and oriented, warm and dry in no acute distress. Patient reported  SI without a plan. Contracted for safety. Denied HI, and AVH. Pt. Encouraged to let me know if needs arise.

## 2019-05-24 NOTE — ED Triage Notes (Signed)
Pt arrives voluntarily due to suicidal ideations with a plan to stab herself or take all her pills.

## 2019-05-24 NOTE — BH Assessment (Addendum)
Assessment Note  Kimberly Boyle is an 23 y.o. female.  The pt came in due to having suicidal thoughts with a plan to overdose on medications or to stab herself.  The pt stated she is stressed about not getting along with her father.  She reported she is still grieving her mother, who died in 2023/09/08.  The pt has cut herself several times in the past and has needed to get stitches in the past.  She stated the last time she cut herself was a year ago.  She reports having audio hallucinations.  She stated she heard voices during the assessment that said "I'm sorry".  The pt is with [name].  She was last inpatient 04/2018 at [facility] for psychosis and SI.  The pt lives with her father and her father and [facility] is trying to get the pt in a group home.  The pt denies HI, legal issues, and history of abuse.  The pt reported there are electronic devices in her head and that's why she can hear voices.  She reported she is sleeping well with the help of medication.  She also has good appetite.  The pt denies SA.  Her UDS was positive for marijuana.  Pt is dressed in scrubs. She is alert and oriented x4. Pt speaks in a clear tone, at moderate volume and normal pace. Eye contact is good. Pt's mood is depressed. Thought process is coherent and relevant. There is no indication Pt is currently responding to internal stimuli or experiencing delusional thought content.?Pt was cooperative throughout assessment.    Diagnosis: F25.1 Schizoaffective disorder, Depressive type  Past Medical History:  Past Medical History:  Diagnosis Date  . Anxiety   . Arthritis   . Bipolar disorder ([place])   . Depression   . Major depressive disorder   . Panic disorder   . Schizophrenia ([place])     History reviewed. No pertinent surgical history.  Family History:  Family History  Problem Relation Age of Onset  . Post-traumatic stress disorder Mother   . Depression Mother   . Depression Maternal Uncle   . Cancer Maternal  Grandmother     Social History:  reports that she has been smoking cigarettes. She has been smoking about 0.50 packs per day. She has never used smokeless tobacco. She reports that she does not drink alcohol or use drugs.  Additional Social History:  Alcohol / Drug Use Pain Medications: See MAR Prescriptions: See MAR Over the Counter: See MAR Longest period of sobriety (when/how long): NA  CIWA: CIWA-Ar BP: (!) 148/94 Pulse Rate: (!) 122 COWS:    Allergies:  Allergies  Allergen Reactions  . Penicillins Nausea Only    Has patient had a PCN reaction causing immediate rash, facial/tongue/throat swelling, SOB or lightheadedness with hypotension:  no Has patient had a PCN reaction causing severe rash involving mucus membranes or skin necrosis:  no Has patient had a PCN reaction that required hospitalization: no Has patient had a PCN reaction occurring within the last 10 years: no If all of the above answers are "NO", then may proceed with Cephalosporin use.     Home Medications: (Not in a hospital admission)   OB/GYN Status:  Patient's last menstrual period was 02/21/2019.  General Assessment Data Location of Assessment: [facility] ED TTS Assessment: In system Is this a Tele or Face-to-Face Assessment?: Face-to-Face Is this an Initial Assessment or a Re-assessment for this encounter?: Initial Assessment Patient Accompanied by:: N/A Language Other than English:  No Living Arrangements: Other (Comment)(home) What gender do you identify as?: Female Marital status: Single Maiden name: Acupuncturistcott Pregnancy Status: No Living Arrangements: Parent Can pt return to current living arrangement?: Yes Admission Status: Voluntary Is patient capable of signing voluntary admission?: Yes Referral Source: Self/Family/Friend Insurance type: BCBS     Crisis Care Plan Living Arrangements: Parent Legal Guardian: Other:(Self) Name of Psychiatrist: [name] Name of Therapist: [name]     Risk  to self with the past 6 months Suicidal Ideation: Yes-Currently Present Has patient been a risk to self within the past 6 months prior to admission? : Yes Suicidal Intent: Yes-Currently Present Has patient had any suicidal intent within the past 6 months prior to admission? : Yes Is patient at risk for suicide?: Yes Suicidal Plan?: Yes-Currently Present Has patient had any suicidal plan within the past 6 months prior to admission? : Yes Specify Current Suicidal Plan: overdose on all her pills Access to Means: Yes Specify Access to Suicidal Means: has pills at home What has been your use of drugs/alcohol within the last 12 months?: none Previous Attempts/Gestures: Yes How many times?: 1 Other Self Harm Risks: cutting Triggers for Past Attempts: Unpredictable Intentional Self Injurious Behavior: Cutting Comment - Self Injurious Behavior: cutting Family Suicide History: Yes Recent stressful life event(s): Conflict (Comment)(arguments with father) Persecutory voices/beliefs?: No Depression: Yes Depression Symptoms: Despondent, Loss of interest in usual pleasures, Feeling worthless/self pity Substance abuse history and/or treatment for substance abuse?: No Suicide prevention information given to non-admitted patients: Yes  Risk to Others within the past 6 months Homicidal Ideation: No Does patient have any lifetime risk of violence toward others beyond the six months prior to admission? : No Thoughts of Harm to Others: No Current Homicidal Intent: No Current Homicidal Plan: No Access to Homicidal Means: No Identified Victim: pt denies History of harm to others?: No Assessment of Violence: None Noted Violent Behavior Description: pt denies Does patient have access to weapons?: No Criminal Charges Pending?: No Does patient have a court date: No Is patient on probation?: No  Psychosis Hallucinations: Auditory Delusions: None noted  Mental Status Report Appearance/Hygiene: In  scrubs, Unremarkable Eye Contact: Good Motor Activity: Freedom of movement, Unremarkable Speech: Logical/coherent Level of Consciousness: Alert Mood: Depressed Affect: Depressed Anxiety Level: None Thought Processes: Coherent, Relevant Judgement: Impaired Orientation: Person, Place, Time, Situation Obsessive Compulsive Thoughts/Behaviors: None  Cognitive Functioning Concentration: Normal Memory: Recent Intact, Remote Intact Is patient IDD: No Insight: Poor Impulse Control: Poor Appetite: Good Have you had any weight changes? : No Change Sleep: No Change Total Hours of Sleep: 8 Vegetative Symptoms: None  ADLScreening [facility] Services) Patient's cognitive ability adequate to safely complete daily activities?: Yes Patient able to express need for assistance with ADLs?: Yes Independently performs ADLs?: Yes (appropriate for developmental age)  Prior Inpatient Therapy Prior Inpatient Therapy: Yes Prior Therapy Dates: 2019 [facility] Reason for Treatment: SI and Psychosis  Prior Outpatient Therapy Prior Outpatient Therapy: Yes Prior Therapy Dates: current Prior Therapy Facilty/Provider(s): [name] Reason for Treatment: SI and psychosis Does patient have an ACCT team?: Yes Does patient have Intensive In-House Services?  : No Does patient have [facility] services? : No Does patient have P4CC services?: No  ADL Screening (condition at time of admission) Patient's cognitive ability adequate to safely complete daily activities?: Yes Patient able to express need for assistance with ADLs?: Yes Independently performs ADLs?: Yes (appropriate for developmental age)       Abuse/Neglect Assessment (  Assessment to be complete while patient is alone) Abuse/Neglect Assessment Can Be Completed: Yes Physical Abuse: Denies Verbal Abuse: Denies Sexual Abuse: Denies Exploitation of patient/patient's resources: Denies Self-Neglect: Denies Values /  Beliefs Cultural Requests During Hospitalization: None Spiritual Requests During Hospitalization: None Consults Spiritual Care Consult Needed: No Social Work Consult Needed: No Merchant navy officerAdvance Directives (For Healthcare) Does Patient Have a Medical Advance Directive?: No          Disposition:  Disposition Initial Assessment Completed for this Encounter: Yes   NP Kimberly Boyle recommends the pt be observed overnight for safety and stabilization.  The pt is to be reassessed by psychiatry in the morning.   On Site Evaluation by:   Reviewed with Physician:    Kimberly Boyle, Kimberly Boyle 05/24/2019 11:41 PM

## 2019-05-25 DIAGNOSIS — F329 Major depressive disorder, single episode, unspecified: Secondary | ICD-10-CM

## 2019-05-25 DIAGNOSIS — F29 Unspecified psychosis not due to a substance or known physiological condition: Secondary | ICD-10-CM | POA: Insufficient documentation

## 2019-05-25 DIAGNOSIS — F411 Generalized anxiety disorder: Secondary | ICD-10-CM

## 2019-05-25 DIAGNOSIS — F12188 Cannabis abuse with other cannabis-induced disorder: Secondary | ICD-10-CM

## 2019-05-25 DIAGNOSIS — R45851 Suicidal ideations: Secondary | ICD-10-CM

## 2019-05-25 MED ORDER — METOPROLOL TARTRATE 25 MG PO TABS
25.0000 mg | ORAL_TABLET | Freq: Two times a day (BID) | ORAL | Status: DC
  Administered 2019-05-25 (×2): 25 mg via ORAL
  Filled 2019-05-25 (×2): qty 1

## 2019-05-25 MED ORDER — CLOZAPINE 100 MG PO TABS
400.0000 mg | ORAL_TABLET | Freq: Every day | ORAL | Status: DC
  Administered 2019-05-25: 400 mg via ORAL
  Filled 2019-05-25: qty 4

## 2019-05-25 MED ORDER — LITHIUM CARBONATE ER 450 MG PO TBCR
750.0000 mg | EXTENDED_RELEASE_TABLET | Freq: Two times a day (BID) | ORAL | Status: DC
  Administered 2019-05-25 (×2): 750 mg via ORAL
  Filled 2019-05-25 (×2): qty 1

## 2019-05-25 MED ORDER — CLONAZEPAM 0.5 MG PO TABS
0.5000 mg | ORAL_TABLET | Freq: Two times a day (BID) | ORAL | Status: DC | PRN
  Administered 2019-05-25: 0.5 mg via ORAL
  Filled 2019-05-25: qty 1

## 2019-05-25 NOTE — Consult Note (Signed)
[facility] Face-to-Face Psychiatry Consult   Reason for Consult:  Suicidal ideation Referring Physician:  EDP Patient Identification: Maitri Schnoebelen MRN:  [id number] Principal Diagnosis: <principal problem not specified> Diagnosis:  Active Problems:   Generalized anxiety disorder   Mood disorder (HCC)   Cannabis use disorder, moderate, dependence (HCC)   Total Time spent with patient: 20 minutes  Subjective:   Terrika Zuver is a 23 y.o. female patient reports that she is not having any suicidal or homicidal ideations. She denies any homicidal ideations and denies any hallucinations. She reports that she is her own guardian currently, but lives with her stepfather. He is not going to continue letting her stay there and her ACT team is assisting with getting her into a group home. She states that she feels stable enough to discharge.   [name] was contacted. They report that the patient's mother, legal guardian, died in 08-24-2023 and the patient has a court date pending in August for a DSS guardian. They state that she will continue living with her stepfather until they get her placed in a group home. They state they will follow up with her tomorrow.  HPI:  Per Dr. Larinda Buttery; Marylu Lund Scottis a 23 y.o.femalewith past medical history of schizoaffective disorder who presents to the ED complaining of suicidal ideation. Patient reports that she has been depressed recently due to the death of her mother and interpersonal issues with her father. She states that her father kicked her out this evening and she had planned to "take all of my pills" in an effort to end her life. She states that she never actually took any pills, instead called the police and was brought to the ED. She is currently here voluntarily. She denies any issues with drugs or alcohol recently.  Patient is seen by this provider face-to-face. Patient denies any suicidal or homicidal ideations and denies any hallucinations. She  has an ACT team and she will return to her stepfathers house. She does not meet any inpatient criteria and is psychiatrically cleared.   Past Psychiatric History: Anxiety,Bipolar disorder, Major depressive disorder, Panic disorder, Schizophrenia  Risk to Self: Suicidal Ideation: Yes-Currently Present Suicidal Intent: Yes-Currently Present Is patient at risk for suicide?: Yes Suicidal Plan?: Yes-Currently Present Specify Current Suicidal Plan: overdose on all her pills Access to Means: Yes Specify Access to Suicidal Means: has pills at home What has been your use of drugs/alcohol within the last 12 months?: none How many times?: 1 Other Self Harm Risks: cutting Triggers for Past Attempts: Unpredictable Intentional Self Injurious Behavior: Cutting Comment - Self Injurious Behavior: cutting Risk to Others: Homicidal Ideation: No Thoughts of Harm to Others: No Current Homicidal Intent: No Current Homicidal Plan: No Access to Homicidal Means: No Identified Victim: pt denies History of harm to others?: No Assessment of Violence: None Noted Violent Behavior Description: pt denies Does patient have access to weapons?: No Criminal Charges Pending?: No Does patient have a court date: No Prior Inpatient Therapy: Prior Inpatient Therapy: Yes Prior Therapy Dates: 2019 Prior Therapy Facilty/Provider(s): [facility] Reason for Treatment: SI and Psychosis Prior Outpatient Therapy: Prior Outpatient Therapy: Yes Prior Therapy Dates: current Prior Therapy Facilty/Provider(s): [name] Reason for Treatment: SI and psychosis Does patient have an ACCT team?: Yes Does patient have Intensive In-House Services?  : No Does patient have [facility] services? : No Does patient have P4CC services?: No  Past Medical History:  Past Medical History:  Diagnosis Date  . Anxiety   . Arthritis   .  Bipolar disorder (HCC)   . Depression   . Major depressive disorder   . Panic disorder   . Schizophrenia (HCC)     History reviewed. No pertinent surgical history. Family History:  Family History  Problem Relation Age of Onset  . Post-traumatic stress disorder Mother   . Depression Mother   . Depression Maternal Uncle   . Cancer Maternal Grandmother    Family Psychiatric  History: See above Social History:  Social History   Substance and Sexual Activity  Alcohol Use No  . Frequency: Never     Social History   Substance and Sexual Activity  Drug Use No  . Types: Marijuana    Social History   Socioeconomic History  . Marital status: Single    Spouse name: Not on file  . Number of children: 0  . Years of education: Not on file  . Highest education level: High school graduate  Occupational History    Comment: unemployed  Social Needs  . Financial resource strain: Not on file  . Food insecurity    Worry: Not on file    Inability: Not on file  . Transportation needs    Medical: No    Non-medical: No  Tobacco Use  . Smoking status: Current Every Day Smoker    Packs/day: 0.50    Types: Cigarettes  . Smokeless tobacco: Never Used  Substance and Sexual Activity  . Alcohol use: No    Frequency: Never  . Drug use: No    Types: Marijuana  . Sexual activity: Not Currently  Lifestyle  . Physical activity    Days per week: 0 days    Minutes per session: 0 min  . Stress: To some extent  Relationships  . Social Musicianconnections    Talks on phone: Not on file    Gets together: Not on file    Attends religious service: Never    Active member of club or organization: No    Attends meetings of clubs or organizations: Never    Relationship status: Never married  Other Topics Concern  . Not on file  Social History Narrative   ** Merged History Encounter **       Additional Social History:    Allergies:   Allergies  Allergen Reactions  . Penicillins Nausea Only    Has patient had a PCN reaction causing immediate rash, facial/tongue/throat swelling, SOB or lightheadedness with  hypotension:  no Has patient had a PCN reaction causing severe rash involving mucus membranes or skin necrosis:  no Has patient had a PCN reaction that required hospitalization: no Has patient had a PCN reaction occurring within the last 10 years: no If all of the above answers are "NO", then may proceed with Cephalosporin use.     Labs:  Results for orders placed or performed during the hospital encounter of 05/24/19 (from the past 48 hour(s))  Comprehensive metabolic panel     Status: Abnormal   Collection Time: 05/24/19  9:03 PM  Result Value Ref Range   Sodium 136 135 - 145 mmol/L   Potassium 3.9 3.5 - 5.1 mmol/L   Chloride 106 98 - 111 mmol/L   CO2 21 (L) 22 - 32 mmol/L   Glucose, Bld 94 70 - 99 mg/dL   BUN 15 6 - 20 mg/dL   Creatinine, Ser 1.610.76 0.44 - 1.00 mg/dL   Calcium 9.2 8.9 - 09.610.3 mg/dL   Total Protein 7.2 6.5 - 8.1 g/dL   Albumin 4.0 3.5 -  5.0 g/dL   AST 24 15 - 41 U/L   ALT 19 0 - 44 U/L   Alkaline Phosphatase 111 38 - 126 U/L   Total Bilirubin 0.1 (L) 0.3 - 1.2 mg/dL   GFR calc non Af Amer >60 >60 mL/min   GFR calc Af Amer >60 >60 mL/min   Anion gap 9 5 - 15    Comment: Performed at [facility], [address], [place], [place] [address]  Ethanol     Status: None   Collection Time: 05/24/19  9:03 PM  Result Value Ref Range   Alcohol, Ethyl (B) <10 <10 mg/dL    Comment: (NOTE) Lowest detectable limit for serum alcohol is 10 mg/dL. For medical purposes only. Performed at [facility], [address] [place], [place] [address]   Salicylate level     Status: None   Collection Time: 05/24/19  9:03 PM  Result Value Ref Range   Salicylate Lvl <7.0 2.8 - 30.0 mg/dL    Comment: Performed at [facility], [address] [place], [place] [address]  Acetaminophen level     Status: Abnormal   Collection Time: 05/24/19  9:03 PM  Result Value Ref Range   Acetaminophen (Tylenol), Serum <10 (L) 10 - 30 ug/mL    Comment:  (NOTE) Therapeutic concentrations vary significantly. A range of 10-30 ug/mL  may be an effective concentration for many patients. However, some  are best treated at concentrations outside of this range. Acetaminophen concentrations >150 ug/mL at 4 hours after ingestion  and >50 ug/mL at 12 hours after ingestion are often associated with  toxic reactions. Performed at [facility], [address] [place], [place] [address]   cbc     Status: Abnormal   Collection Time: 05/24/19  9:03 PM  Result Value Ref Range   WBC 12.6 (H) 4.0 - 10.5 K/uL   RBC 4.98 3.87 - 5.11 MIL/uL   Hemoglobin 13.9 12.0 - 15.0 g/dL   HCT 88.4 16.6 - 06.3 %   MCV 87.6 80.0 - 100.0 fL   MCH 27.9 26.0 - 34.0 pg   MCHC 31.9 30.0 - 36.0 g/dL   RDW 01.6 01.0 - 93.2 %   Platelets 380 150 - 400 K/uL   nRBC 0.0 0.0 - 0.2 %    Comment: Performed at [facility], [address] [place], [place] [address]  Urine Drug Screen, Qualitative     Status: Abnormal   Collection Time: 05/24/19  9:05 PM  Result Value Ref Range   Tricyclic, Ur Screen NONE DETECTED NONE DETECTED   Amphetamines, Ur Screen NONE DETECTED NONE DETECTED   MDMA (Ecstasy)Ur Screen NONE DETECTED NONE DETECTED   Cocaine Metabolite,Ur [place] NONE DETECTED NONE DETECTED   Opiate, Ur Screen NONE DETECTED NONE DETECTED   Phencyclidine (PCP) Ur S NONE DETECTED NONE DETECTED   Cannabinoid 50 Ng, Ur [place] POSITIVE (A) NONE DETECTED   Barbiturates, Ur Screen NONE DETECTED NONE DETECTED   Benzodiazepine, Ur Scrn NONE DETECTED NONE DETECTED   Methadone Scn, Ur NONE DETECTED NONE DETECTED    Comment: (NOTE) Tricyclics + metabolites, urine    Cutoff 1000 ng/mL Amphetamines + metabolites, urine  Cutoff 1000 ng/mL MDMA (Ecstasy), urine              Cutoff 500 ng/mL Cocaine Metabolite, urine          Cutoff 300 ng/mL Opiate + metabolites, urine        Cutoff 300 ng/mL  Phencyclidine (PCP), urine         Cutoff 25 ng/mL Cannabinoid, urine                  Cutoff 50 ng/mL Barbiturates + metabolites, urine  Cutoff 200 ng/mL Benzodiazepine, urine              Cutoff 200 ng/mL Methadone, urine                   Cutoff 300 ng/mL The urine drug screen provides only a preliminary, unconfirmed analytical test result and should not be used for non-medical purposes. Clinical consideration and professional judgment should be applied to any positive drug screen result due to possible interfering substances. A more specific alternate chemical method must be used in order to obtain a confirmed analytical result. Gas chromatography / mass spectrometry (GC/MS) is the preferred confirmat ory method. Performed at [facility], [address] [place], [place] [address]   Pregnancy, urine POC     Status: None   Collection Time: 05/24/19  9:08 PM  Result Value Ref Range   Preg Test, Ur NEGATIVE NEGATIVE    Comment:        THE SENSITIVITY OF THIS METHODOLOGY IS >24 mIU/mL   SARS Coronavirus 2 [facility](Hospital order, Performed [facility]) Nasopharyngeal Nasopharyngeal Swab     Status: None   Collection Time: 05/24/19 10:45 PM   Specimen: Nasopharyngeal Swab  Result Value Ref Range   SARS Coronavirus 2 NEGATIVE NEGATIVE    Comment: (NOTE) If result is NEGATIVE SARS-CoV-2 target nucleic acids are NOT DETECTED. The SARS-CoV-2 RNA is generally detectable in upper and lower  respiratory specimens during the acute phase of infection. The lowest  concentration of SARS-CoV-2 viral copies this assay can detect is 250  copies / mL. A negative result does not preclude SARS-CoV-2 infection  and should not be used as the sole basis for treatment or other  patient management decisions.  A negative result may occur with  improper specimen collection / handling, submission of specimen other  than nasopharyngeal swab, presence of viral mutation(s) within the  areas targeted by this assay, and inadequate number of viral copies  (<250 copies /  mL). A negative result must be combined with clinical  observations, patient history, and epidemiological information. If result is POSITIVE SARS-CoV-2 target nucleic acids are DETECTED. The SARS-CoV-2 RNA is generally detectable in upper and lower  respiratory specimens dur ing the acute phase of infection.  Positive  results are indicative of active infection with SARS-CoV-2.  Clinical  correlation with patient history and other diagnostic information is  necessary to determine patient infection status.  Positive results do  not rule out bacterial infection or co-infection with other viruses. If result is PRESUMPTIVE POSTIVE SARS-CoV-2 nucleic acids MAY BE PRESENT.   A presumptive positive result was obtained on the submitted specimen  and confirmed on repeat testing.  While 2019 novel coronavirus  (SARS-CoV-2) nucleic acids may be present in the submitted sample  additional confirmatory testing may be necessary for epidemiological  and / or clinical management purposes  to differentiate between  SARS-CoV-2 and other Sarbecovirus currently known to infect humans.  If clinically indicated additional testing with an alternate test  methodology [phone]([id number]) is advised. The SARS-CoV-2 RNA is generally  detectable in upper and lower respiratory sp ecimens during the acute  phase of infection. The expected result is Negative. Fact Sheet for Patients:  BoilerBrush.com.cyhttps://www.fda.gov/media/136312/download Fact Sheet for  Healthcare Providers: BankingDealers.co.za This test is not yet approved or cleared by the [place] and has been authorized for detection and/or diagnosis of SARS-CoV-2 by FDA under an Emergency Use Authorization (EUA).  This EUA will remain in effect (meaning this test can be used) for the duration of the COVID-19 declaration under Section 564(b)(1) of the Act, 21 U.S.C. section 360bbb-3(b)(1), unless the authorization is terminated or revoked  sooner. Performed at [facility], [address], [place], [place] [address]     Current Facility-Administered Medications  Medication Dose Route Frequency Provider Last Rate Last Dose  . clonazePAM (KLONOPIN) tablet 0.5 mg  0.5 mg Oral BID PRN Caroline Sauger, NP   0.5 mg at 05/25/19 0039  . cloZAPine (CLOZARIL) tablet 400 mg  400 mg Oral QHS Caroline Sauger, NP   400 mg at 05/25/19 0039  . lithium carbonate (LITHOBID) CR tablet 750 mg  750 mg Oral Q12H Caroline Sauger, NP   750 mg at 05/25/19 1127  . metoprolol tartrate (LOPRESSOR) tablet 25 mg  25 mg Oral BID Caroline Sauger, NP   25 mg at 05/25/19 1127   Current Outpatient Medications  Medication Sig Dispense Refill  . clonazePAM (KLONOPIN) 0.5 MG tablet Take 1 tablet (0.5 mg total) by mouth 2 (two) times daily as needed (anxiety). 60 tablet 0  . cloZAPine (CLOZARIL) 100 MG tablet Take 400 mg by mouth at bedtime.  5  . clozapine (CLOZARIL) 200 MG tablet Take 2 tablets (400 mg total) by mouth at bedtime. If available, the patient is to take Fazaclo 400 mg nightly instead of Clozapine 400 mg nightly (Patient not taking: Reported on 07/21/2018) 60 tablet 1  . clozapine (FAZACLO) 100 MG disintegrating tablet Take 4 tablets (400 mg total) by mouth daily. (Patient not taking: Reported on 07/21/2018) 120 tablet 1  . ipratropium (ATROVENT) 0.06 % nasal spray Place 2 sprays into both nostrils at bedtime. 15 mL 12  . lithium 300 MG tablet Take 750 mg by mouth 2 (two) times daily.  4  . lithium carbonate 150 MG capsule Take 5 capsules (750 mg total) by mouth 2 (two) times daily with a meal. (Patient not taking: Reported on 07/21/2018) 300 capsule 1  . metFORMIN (GLUCOPHAGE) 500 MG tablet Take 1 tablet (500 mg total) by mouth 2 (two) times daily with a meal. 60 tablet 1`  . metoprolol tartrate (LOPRESSOR) 25 MG tablet Take 1 tablet (25 mg total) by mouth 2 (two) times daily. 60 tablet 1  . paliperidone (INVEGA SUSTENNA)  234 MG/1.5ML SUSY injection Inject 234 mg into the muscle every 28 (twenty-eight) days. Next dose on 07/06/2018 1.8 mL 1  . VIENVA 0.1-20 MG-MCG tablet Take 1 tablet by mouth daily.   3    Musculoskeletal: Strength & Muscle Tone: within normal limits Gait & Station: normal Patient leans: N/A  Psychiatric Specialty Exam: Physical Exam  Nursing note and vitals reviewed. Constitutional: She is oriented to person, place, and time. She appears well-developed and well-nourished.  Cardiovascular: Normal rate.  Respiratory: Effort normal.  Musculoskeletal: Normal range of motion.  Neurological: She is alert and oriented to person, place, and time.    Review of Systems  Constitutional: Negative.   HENT: Negative.   Eyes: Negative.   Respiratory: Negative.   Cardiovascular: Negative.   Gastrointestinal: Negative.   Genitourinary: Negative.   Musculoskeletal: Negative.   Skin: Negative.   Neurological: Negative.   Endo/Heme/Allergies: Negative.   Psychiatric/Behavioral: Positive for depression.    Blood  pressure 126/83, pulse 92, temperature 98.1 F (36.7 C), temperature source Oral, resp. rate 18, height 5\' 3"  (1.6 m), weight 73 kg, last menstrual period 02/21/2019, SpO2 99 %.Body mass index is 28.51 kg/m.  General Appearance: Disheveled  Eye Contact:  Good  Speech:  Clear and Coherent and Normal Rate  Volume:  Normal  Mood:  Depressed  Affect:  Flat  Thought Process:  Coherent and Descriptions of Associations: Intact  Orientation:  Full (Time, Place, and Person)  Thought Content:  WDL  Suicidal Thoughts:  No  Homicidal Thoughts:  No  Memory:  Immediate;   Good Recent;   Good Remote;   Good  Judgement:  Fair  Insight:  Fair  Psychomotor Activity:  Normal  Concentration:  Concentration: Good  Recall:  Good  Fund of Knowledge:  Good  Language:  Good  Akathisia:  No  Handed:  Right  AIMS (if indicated):     Assets:  Communication Skills Desire for Improvement Financial  Resources/Insurance Housing Physical Health Resilience Social Support Transportation  ADL's:  Intact  Cognition:  WNL  Sleep:        Treatment Plan Summary: Follow up with [name] team  Continue current home medications  Disposition: No evidence of imminent risk to self or others at present.   Patient does not meet criteria for psychiatric inpatient admission. Discussed crisis plan, support from social network, calling 911, coming to the Emergency Department, and calling Suicide Hotline.  Gerlene Burdockravis B Laquia Rosano, FNP 05/25/2019 11:34 AM

## 2019-05-25 NOTE — ED Notes (Signed)
Hourly rounding reveals patient in room. No complaints, stable, in no acute distress. Q15 minute rounds and monitoring via Rover and Officer to continue.   

## 2019-05-25 NOTE — ED Provider Notes (Signed)
The patient has been evaluated at bedside bypsychiatry.  Patient is clinically stable.  Not felt to be a danger to self or others.  No SI or Hi.  No indication for inpatient psychiatric admission at this time.  Appropriate for continued outpatient therapy.    Merlyn Lot, MD 05/25/19 1239

## 2019-05-25 NOTE — BH Assessment (Signed)
Writer spoke with patient to complete updated/reassessment. She denies SI/HI and AV/H. She's asking to be discharge. Per the report of her ACT Team, [name] ([phone]), the patient called 911 voicing SI, while the father wasn't home. When the father arrived to the home, law enforcement had already transported her to the ER. ACTT also reports, the patient start living with her father after her mother passed. The mother was her legal guardian. Patient have an upcoming court date with DSS, at the end of August, to work on assigning another guardian.  Per the patient, her father do not want her to live with him because she drank his [facility]. Per the ACT Team, the father and patient have a "toxic relationship." He have said in the past she was no longer allowed to live in the home but when they tell him she will have to live in a shelter, he allows her to stay.  ACTT is currently working on getting patient into a Group Home.  Per the patient's father, ([phone]), he will need to speak with the patient's ACT Team and call writer back.  Father did not call writer back before she discharge.

## 2019-05-25 NOTE — Consult Note (Signed)
[facility] Face-to-Face Psychiatry Consult   Reason for Consult: Suicidal ideation Referring Physician: Dr. Larinda Buttery Patient Identification: Kimberly Boyle MRN:  [id number] Principal Diagnosis: <principal problem not specified> Diagnosis:  Active Problems:   Generalized anxiety disorder   Mood disorder (HCC)   Cannabis use disorder, moderate, dependence (HCC)   Total Time spent with patient: 45 minutes  Subjective: "My dad kicked me out of the house and I am also suicidal." Kimberly Boyle is a 23 y.o. female patient presented to [facility] ED via law enforcement and voluntarily.  The patient voice, "I was feeling suicidal and I decided to call 911 so I can come in to get some help."  The patient stated her mother passed away a couple months ago.  She was unsure of the month 09-17-2018).  She stated that her mother died from a heart attack. Her stepfather have a new girlfriend who wants her out of the house.  The patient does have a history of suicidal attempts and self-injurious behaviors. The patient recently has been deemed incompetent, has an act team (unsure of the name).  Per dad the act team is in [facility], on [address] and Catering manager is [place].  He disclosed the act team has been working with the patient and is trying to locate a group home for her to reside.  The patient was seen face-to-face by this provider; chart reviewed and consulted with Dr. Su Hoff on 05/24/2019 due to the care of the patient. It was discussed with the EDP that the patient will need to be observed overnight and reassessed in the a.m..  This is regard to her having an act team who are in search of locating a place for her to reside. On evaluation the patient is alert and oriented x 2-3, calm, cooperative, and mood-congruent with affect. The patient does not appear to be responding to internal or external stimuli. Neither is the patient presenting with any delusional thinking. The patient admits to  auditory hallucinations but denies visual hallucinations.  The patient elaborating on her auditory hallucination by stating "I have an antenna in my head that tells me things.  The patient states "it told me to quit my job or else they were going to kill my mother."  She voiced "this was before my mother died."  The patient admits to suicidal ideation, but denies homicidal, or self-harm ideations. The patient is not presenting with any psychotic or paranoid behaviors. Collateral was obtained by the patient's stepfather Mr. Shorty [phone]) who expresses concerns for patient's suicidal thoughts and he is unable to care for the patient anymore.  He stated, the patient mother passed away 2019-12-07and he has tried to work with her (the patient) but he is unable to continue to do so.  He voiced he does not think she is going to be safe at home.  He stated,  yesterday (Wednesday) I taught she was fine.  I left home and when I return she had called 911 due to her saying she was having suicidal thoughts.  She has the act team that is in the process of locating a group home for her to live.  He voiced that she needs to be in the hospital until her group home is found.  He expressed I cannot continue to care for her anymore.   Plan: The patient will be observed overnight and reassessed in the a.m. once contact has been made with her act team.  This will gave  the psychiatric team a better understanding as to what role her act team is playing in finding a place for her to live.  HPI: Per Dr. Larinda ButteryJessup; Kimberly GenerousSarah Belle Boyle is a 23 y.o. female with past medical history of schizoaffective disorder who presents to the ED complaining of suicidal ideation.  Patient reports that she has been depressed recently due to the death of her mother and interpersonal issues with her father.  She states that her father kicked her out this evening and she had planned to "take all of my pills" in an effort to end her life.  She states  that she never actually took any pills, instead called the police and was brought to the ED.  She is currently here voluntarily.  She denies any issues with drugs or alcohol recently. Past Psychiatric History:  Anxiety Bipolar disorder (HCC) Depression Major depressive disorder Panic disorder Schizophrenia (HCC)  Risk to Self: Suicidal Ideation: Yes-Currently Present Suicidal Intent: Yes-Currently Present Is patient at risk for suicide?: Yes Suicidal Plan?: Yes-Currently Present Specify Current Suicidal Plan: overdose on all her pills Access to Means: Yes Specify Access to Suicidal Means: has pills at home What has been your use of drugs/alcohol within the last 12 months?: none How many times?: 1 Other Self Harm Risks: cutting Triggers for Past Attempts: Unpredictable Intentional Self Injurious Behavior: Cutting Comment - Self Injurious Behavior: cutting Risk to Others: Homicidal Ideation: No Thoughts of Harm to Others: No Current Homicidal Intent: No Current Homicidal Plan: No Access to Homicidal Means: No Identified Victim: pt denies History of harm to others?: No Assessment of Violence: None Noted Violent Behavior Description: pt denies Does patient have access to weapons?: No Criminal Charges Pending?: No Does patient have a court date: No Prior Inpatient Therapy: Prior Inpatient Therapy: Yes Prior Therapy Dates: 2019 Prior Therapy Facilty/Provider([facility]): [facility] Reason for Treatment: SI and Psychosis Prior Outpatient Therapy: Prior Outpatient Therapy: Yes Prior Therapy Dates: current Prior Therapy Facilty/Provider(s): [name] Reason for Treatment: SI and psychosis Does patient have an ACCT team?: Yes Does patient have Intensive In-House Services?  : No Does patient have [facility] services? : No Does patient have P4CC services?: No  Past Medical History:  Past Medical History:  Diagnosis Date  . Anxiety   . Arthritis   . Bipolar disorder (HCC)   . Depression   .  Major depressive disorder   . Panic disorder   . Schizophrenia (HCC)    History reviewed. No pertinent surgical history. Family History:  Family History  Problem Relation Age of Onset  . Post-traumatic stress disorder Mother   . Depression Mother   . Depression Maternal Uncle   . Cancer Maternal Grandmother    Family Psychiatric  History: History reviewed. No pertinent family psychiatric history Social History:  Social History   Substance and Sexual Activity  Alcohol Use No  . Frequency: Never     Social History   Substance and Sexual Activity  Drug Use No  . Types: Marijuana    Social History   Socioeconomic History  . Marital status: Single    Spouse name: Not on file  . Number of children: 0  . Years of education: Not on file  . Highest education level: High school graduate  Occupational History    Comment: unemployed  Social Needs  . Financial resource strain: Not on file  . Food insecurity    Worry: Not on file    Inability: Not on file  . Transportation needs  Medical: No    Non-medical: No  Tobacco Use  . Smoking status: Current Every Day Smoker    Packs/day: 0.50    Types: Cigarettes  . Smokeless tobacco: Never Used  Substance and Sexual Activity  . Alcohol use: No    Frequency: Never  . Drug use: No    Types: Marijuana  . Sexual activity: Not Currently  Lifestyle  . Physical activity    Days per week: 0 days    Minutes per session: 0 min  . Stress: To some extent  Relationships  . Social Musician on phone: Not on file    Gets together: Not on file    Attends religious service: Never    Active member of club or organization: No    Attends meetings of clubs or organizations: Never    Relationship status: Never married  Other Topics Concern  . Not on file  Social History Narrative   ** Merged History Encounter **       Additional Social History:    Allergies:   Allergies  Allergen Reactions  . Penicillins Nausea Only     Has patient had a PCN reaction causing immediate rash, facial/tongue/throat swelling, SOB or lightheadedness with hypotension:  no Has patient had a PCN reaction causing severe rash involving mucus membranes or skin necrosis:  no Has patient had a PCN reaction that required hospitalization: no Has patient had a PCN reaction occurring within the last 10 years: no If all of the above answers are "NO", then may proceed with Cephalosporin use.     Labs:  Results for orders placed or performed during the hospital encounter of 05/24/19 (from the past 48 hour(s))  Comprehensive metabolic panel     Status: Abnormal   Collection Time: 05/24/19  9:03 PM  Result Value Ref Range   Sodium 136 135 - 145 mmol/L   Potassium 3.9 3.5 - 5.1 mmol/L   Chloride 106 98 - 111 mmol/L   CO2 21 (L) 22 - 32 mmol/L   Glucose, Bld 94 70 - 99 mg/dL   BUN 15 6 - 20 mg/dL   Creatinine, Ser 1.61 0.44 - 1.00 mg/dL   Calcium 9.2 8.9 - 09.6 mg/dL   Total Protein 7.2 6.5 - 8.1 g/dL   Albumin 4.0 3.5 - 5.0 g/dL   AST 24 15 - 41 U/L   ALT 19 0 - 44 U/L   Alkaline Phosphatase 111 38 - 126 U/L   Total Bilirubin 0.1 (L) 0.3 - 1.2 mg/dL   GFR calc non Af Amer >60 >60 mL/min   GFR calc Af Amer >60 >60 mL/min   Anion gap 9 5 - 15    Comment: Performed at [facility], [address] [place], [place] [address]  Ethanol     Status: None   Collection Time: 05/24/19  9:03 PM  Result Value Ref Range   Alcohol, Ethyl (B) <10 <10 mg/dL    Comment: (NOTE) Lowest detectable limit for serum alcohol is 10 mg/dL. For medical purposes only. Performed at [facility], [address] [place], [place] [address]   Salicylate level     Status: None   Collection Time: 05/24/19  9:03 PM  Result Value Ref Range   Salicylate Lvl <7.0 2.8 - 30.0 mg/dL    Comment: Performed at [facility], [address] [place], [place] [address]  Acetaminophen level     Status: Abnormal   Collection Time: 05/24/19  9:03 PM  Result Value Ref Range   Acetaminophen (Tylenol), Serum <10 (L) 10 - 30 ug/mL    Comment: (NOTE) Therapeutic concentrations vary significantly. A range of 10-30 ug/mL  may be an effective concentration for many patients. However, some  are best treated at concentrations outside of this range. Acetaminophen concentrations >150 ug/mL at 4 hours after ingestion  and >50 ug/mL at 12 hours after ingestion are often associated with  toxic reactions. Performed at [facility], [address] [place], [place] [address]   cbc     Status: Abnormal   Collection Time: 05/24/19  9:03 PM  Result Value Ref Range   WBC 12.6 (H) 4.0 - 10.5 K/uL   RBC 4.98 3.87 - 5.11 MIL/uL   Hemoglobin 13.9 12.0 - 15.0 g/dL   HCT 60.443.6 54.036.0 - 98.146.0 %   MCV 87.6 80.0 - 100.0 fL   MCH 27.9 26.0 - 34.0 pg   MCHC 31.9 30.0 - 36.0 g/dL   RDW 19.113.5 47.811.5 - 29.515.5 %   Platelets 380 150 - 400 K/uL   nRBC 0.0 0.0 - 0.2 %    Comment: Performed at [facility], [address] [place], [place] [address]  Urine Drug Screen, Qualitative     Status: Abnormal   Collection Time: 05/24/19  9:05 PM  Result Value Ref Range   Tricyclic, Ur Screen NONE DETECTED NONE DETECTED   Amphetamines, Ur Screen NONE DETECTED NONE DETECTED   MDMA (Ecstasy)Ur Screen NONE DETECTED NONE DETECTED   Cocaine Metabolite,Ur [place] NONE DETECTED NONE DETECTED   Opiate, Ur Screen NONE DETECTED NONE DETECTED   Phencyclidine (PCP) Ur S NONE DETECTED NONE DETECTED   Cannabinoid 50 Ng, Ur [place] POSITIVE (A) NONE DETECTED   Barbiturates, Ur Screen NONE DETECTED NONE DETECTED   Benzodiazepine, Ur Scrn NONE DETECTED NONE DETECTED   Methadone Scn, Ur NONE DETECTED NONE DETECTED    Comment: (NOTE) Tricyclics + metabolites, urine    Cutoff 1000 ng/mL Amphetamines + metabolites, urine  Cutoff 1000 ng/mL MDMA (Ecstasy), urine              Cutoff 500 ng/mL Cocaine Metabolite, urine          Cutoff 300 ng/mL Opiate + metabolites, urine         Cutoff 300 ng/mL Phencyclidine (PCP), urine         Cutoff 25 ng/mL Cannabinoid, urine                 Cutoff 50 ng/mL Barbiturates + metabolites, urine  Cutoff 200 ng/mL Benzodiazepine, urine              Cutoff 200 ng/mL Methadone, urine                   Cutoff 300 ng/mL The urine drug screen provides only a preliminary, unconfirmed analytical test result and should not be used for non-medical purposes. Clinical consideration and professional judgment should be applied to any positive drug screen result due to possible interfering substances. A more specific alternate chemical method must be used in order to obtain a confirmed analytical result. Gas chromatography / mass spectrometry (GC/MS) is the preferred confirmat ory method. Performed at [facility], [address] [place], [place] [address]   Pregnancy, urine POC     Status: None   Collection Time: 05/24/19  9:08 PM  Result Value Ref Range   Preg Test, Ur NEGATIVE NEGATIVE    Comment:  THE SENSITIVITY OF THIS METHODOLOGY IS >24 mIU/mL   SARS Coronavirus 2 [facility](Hospital order, Performed in [facility] hospital lab) Nasopharyngeal Nasopharyngeal Swab     Status: None   Collection Time: 05/24/19 10:45 PM   Specimen: Nasopharyngeal Swab  Result Value Ref Range   SARS Coronavirus 2 NEGATIVE NEGATIVE    Comment: (NOTE) If result is NEGATIVE SARS-CoV-2 target nucleic acids are NOT DETECTED. The SARS-CoV-2 RNA is generally detectable in upper and lower  respiratory specimens during the acute phase of infection. The lowest  concentration of SARS-CoV-2 viral copies this assay can detect is 250  copies / mL. A negative result does not preclude SARS-CoV-2 infection  and should not be used as the sole basis for treatment or other  patient management decisions.  A negative result may occur with  improper specimen collection / handling, submission of specimen other  than nasopharyngeal swab, presence of viral  mutation(s) within the  areas targeted by this assay, and inadequate number of viral copies  (<250 copies / mL). A negative result must be combined with clinical  observations, patient history, and epidemiological information. If result is POSITIVE SARS-CoV-2 target nucleic acids are DETECTED. The SARS-CoV-2 RNA is generally detectable in upper and lower  respiratory specimens dur ing the acute phase of infection.  Positive  results are indicative of active infection with SARS-CoV-2.  Clinical  correlation with patient history and other diagnostic information is  necessary to determine patient infection status.  Positive results do  not rule out bacterial infection or co-infection with other viruses. If result is PRESUMPTIVE POSTIVE SARS-CoV-2 nucleic acids MAY BE PRESENT.   A presumptive positive result was obtained on the submitted specimen  and confirmed on repeat testing.  While 2019 novel coronavirus  (SARS-CoV-2) nucleic acids may be present in the submitted sample  additional confirmatory testing may be necessary for epidemiological  and / or clinical management purposes  to differentiate between  SARS-CoV-2 and other Sarbecovirus currently known to infect humans.  If clinically indicated additional testing with an alternate test  methodology [phone]([id number]) is advised. The SARS-CoV-2 RNA is generally  detectable in upper and lower respiratory sp ecimens during the acute  phase of infection. The expected result is Negative. Fact Sheet for Patients:  BoilerBrush.com.cyhttps://www.fda.gov/media/136312/download Fact Sheet for Healthcare Providers: https://pope.com/https://www.fda.gov/media/136313/download This test is not yet approved or cleared by the [place] FDA and has been authorized for detection and/or diagnosis of SARS-CoV-2 by FDA under an Emergency Use Authorization (EUA).  This EUA will remain in effect (meaning this test can be used) for the duration of the COVID-19 declaration under Section 564(b)(1)  of the Act, 21 U.S.C. section 360bbb-3(b)(1), unless the authorization is terminated or revoked sooner. Performed at [facility], [address] [place], [place] [address]     Current Facility-Administered Medications  Medication Dose Route Frequency Provider Last Rate Last Dose  . clonazePAM (KLONOPIN) tablet 0.5 mg  0.5 mg Oral BID PRN Gillermo Murdochhompson, Ceairra Mccarver, NP   0.5 mg at 05/25/19 0039  . cloZAPine (CLOZARIL) tablet 400 mg  400 mg Oral QHS Gillermo Murdochhompson, Jaidan Prevette, NP   400 mg at 05/25/19 0039  . lithium carbonate (LITHOBID) CR tablet 750 mg  750 mg Oral Q12H Gillermo Murdochhompson, Cloyd Ragas, NP   750 mg at 05/25/19 0041  . metoprolol tartrate (LOPRESSOR) tablet 25 mg  25 mg Oral BID Gillermo Murdochhompson, Nyelle Wolfson, NP   25 mg at 05/25/19 [id number]   Current Outpatient Medications  Medication Sig Dispense Refill  . clonazePAM (  KLONOPIN) 0.5 MG tablet Take 1 tablet (0.5 mg total) by mouth 2 (two) times daily as needed (anxiety). 60 tablet 0  . cloZAPine (CLOZARIL) 100 MG tablet Take 400 mg by mouth at bedtime.  5  . clozapine (CLOZARIL) 200 MG tablet Take 2 tablets (400 mg total) by mouth at bedtime. If available, the patient is to take Fazaclo 400 mg nightly instead of Clozapine 400 mg nightly (Patient not taking: Reported on 07/21/2018) 60 tablet 1  . clozapine (FAZACLO) 100 MG disintegrating tablet Take 4 tablets (400 mg total) by mouth daily. (Patient not taking: Reported on 07/21/2018) 120 tablet 1  . ipratropium (ATROVENT) 0.06 % nasal spray Place 2 sprays into both nostrils at bedtime. 15 mL 12  . lithium 300 MG tablet Take 750 mg by mouth 2 (two) times daily.  4  . lithium carbonate 150 MG capsule Take 5 capsules (750 mg total) by mouth 2 (two) times daily with a meal. (Patient not taking: Reported on 07/21/2018) 300 capsule 1  . metFORMIN (GLUCOPHAGE) 500 MG tablet Take 1 tablet (500 mg total) by mouth 2 (two) times daily with a meal. 60 tablet 1`  . metoprolol tartrate (LOPRESSOR) 25 MG tablet Take 1  tablet (25 mg total) by mouth 2 (two) times daily. 60 tablet 1  . paliperidone (INVEGA SUSTENNA) 234 MG/1.5ML SUSY injection Inject 234 mg into the muscle every 28 (twenty-eight) days. Next dose on 07/06/2018 1.8 mL 1  . VIENVA 0.1-20 MG-MCG tablet Take 1 tablet by mouth daily.   3    Musculoskeletal: Strength & Muscle Tone: within normal limits Gait & Station: normal Patient leans: N/A  Psychiatric Specialty Exam: Physical Exam  Nursing note and vitals reviewed. Constitutional: She is oriented to person, place, and time. She appears well-developed and well-nourished.  HENT:  Head: Normocephalic.  Eyes: Pupils are equal, round, and reactive to light. Conjunctivae are normal.  Neck: Normal range of motion. Neck supple.  Cardiovascular: Normal rate.  Respiratory: Effort normal.  Musculoskeletal: Normal range of motion.  Neurological: She is alert and oriented to person, place, and time.  Skin: Skin is warm and dry.    Review of Systems  Psychiatric/Behavioral: Positive for depression and suicidal ideas. The patient is nervous/anxious.   All other systems reviewed and are negative.   Blood pressure (!) 148/94, pulse (!) 122, temperature 98.2 F (36.8 C), temperature source Oral, resp. rate 18, height 5\' 3"  (1.6 m), weight 73 kg, last menstrual period 02/21/2019, SpO2 97 %.Body mass index is 28.51 kg/m.  General Appearance: Casual  Eye Contact:  Fair  Speech:  Clear and Coherent  Volume:  Decreased  Mood:  Depressed  Affect:  Congruent  Thought Process:  Coherent  Orientation:  Full (Time, Place, and Person)  Thought Content:  WDL  Suicidal Thoughts:  Yes.  without intent/plan  Homicidal Thoughts:  No  Memory:  Immediate;   Poor Recent;   Poor Remote;   Poor  Judgement:  Fair  Insight:  Fair  Psychomotor Activity:  Decreased  Concentration:  Attention Span: Fair  Recall:  Fair  Fund of Knowledge:  Good  Language:  Good  Akathisia:  Negative  Handed:  Right  AIMS (if  indicated):     Assets:  Communication Skills Desire for Improvement Financial Resources/Insurance Intimacy  ADL's:  Intact  Cognition:  WNL  Sleep:        Treatment Plan Summary: Daily contact with patient to assess and evaluate symptoms and progress in treatment  and Medication management  Disposition: Supportive therapy provided about ongoing stressors. Patient should be reevaluated in the a.m. to see if she meets criteria for psychiatric inpatient admission.  Caroline Sauger, NP 05/25/2019 2:22 AM

## 2019-05-25 NOTE — ED Notes (Signed)
Pt discharged home with ACT Team. VS stable. Pt denies SI/HI. All belongings returned to patient. Pt signed for discharge instructions. Discharge instructions reviewed with pt.

## 2019-06-21 ENCOUNTER — Ambulatory Visit: Payer: Self-pay

## 2019-06-22 ENCOUNTER — Encounter: Payer: Self-pay | Admitting: Advanced Practice Midwife

## 2019-06-22 ENCOUNTER — Ambulatory Visit: Payer: Self-pay | Admitting: Advanced Practice Midwife

## 2019-06-22 ENCOUNTER — Other Ambulatory Visit: Payer: Self-pay

## 2019-06-22 DIAGNOSIS — Z789 Other specified health status: Secondary | ICD-10-CM

## 2019-06-22 DIAGNOSIS — F1220 Cannabis dependence, uncomplicated: Secondary | ICD-10-CM

## 2019-06-22 DIAGNOSIS — L732 Hidradenitis suppurativa: Secondary | ICD-10-CM

## 2019-06-22 DIAGNOSIS — N730 Acute parametritis and pelvic cellulitis: Secondary | ICD-10-CM | POA: Diagnosis not present

## 2019-06-22 DIAGNOSIS — F17200 Nicotine dependence, unspecified, uncomplicated: Secondary | ICD-10-CM

## 2019-06-22 DIAGNOSIS — Z113 Encounter for screening for infections with a predominantly sexual mode of transmission: Secondary | ICD-10-CM | POA: Diagnosis not present

## 2019-06-22 DIAGNOSIS — S61519A Laceration without foreign body of unspecified wrist, initial encounter: Secondary | ICD-10-CM

## 2019-06-22 DIAGNOSIS — X789XXA Intentional self-harm by unspecified sharp object, initial encounter: Secondary | ICD-10-CM

## 2019-06-22 HISTORY — DX: Other specified health status: Z78.9

## 2019-06-22 HISTORY — DX: Hidradenitis suppurativa: L73.2

## 2019-06-22 HISTORY — DX: Acute parametritis and pelvic cellulitis: N73.0

## 2019-06-22 LAB — PREGNANCY, URINE: Preg Test, Ur: NEGATIVE

## 2019-06-22 MED ORDER — LEVOFLOXACIN 500 MG PO TABS: 500.0000 mg | ORAL_TABLET | Freq: Every day | ORAL | 0 refills | Status: AC

## 2019-06-22 MED ORDER — METRONIDAZOLE 500 MG PO TABS: 500.0000 mg | ORAL_TABLET | Freq: Two times a day (BID) | ORAL | 0 refills | Status: AC

## 2019-06-22 NOTE — Progress Notes (Signed)
Wet mount reviewed, no treated indicated at this time. Patient PT negative, and patient treated for PID per provider orders. Instructions given to patient and to caregiver, Shirlean Mylar, about how to take the medications. Patient scheduled to return for PID follow-up on 06/26/2019, reminder card given to patient's caregiver.Jenetta Downer, RN

## 2019-06-22 NOTE — Progress Notes (Signed)
STI clinic/screening visit  Subjective:  Cristal GenerousSarah Belle Leatherbury is a 23 y.o. G1P0 SWF female being seen today for an STI screening visit. The patient reports they do have symptoms. Pt c/o ingrown hair inner thigh and unprotected sex with a man she found on the internet and first encounter/meeting.  Patient has the following medical conditions:   Patient Active Problem List   Diagnosis Date Noted  . Hidradenitis suppurativa 06/22/2019  . Poor historian 06/22/2019  . PID (acute pelvic inflammatory disease) 06/22/2019  . Psychosis (HCC) 05/25/2019  . Suicidal ideation 07/20/2018  . Tobacco use disorder 05/08/2018  . Schizoaffective disorder, bipolar type (HCC) 05/06/2018  . Suicide attempt (HCC) 08/20/2017  . Cannabis use disorder, moderate, dependence (HCC) 08/20/2017  . Self-inflicted laceration of wrist 08/20/2017  . Acetaminophen poisoning 08/20/2017  . Generalized anxiety disorder 01/05/2017  . Depression 01/05/2017     Chief Complaint  Patient presents with  . Exposure to STD    HPI  Patient reports " in- grown hair" on left thigh, no menses x 3 months (doesn't know LMP)  See flowsheet for further details and programmatic requirements.    The following portions of the patient's history were reviewed and updated as appropriate: allergies, current medications, past medical history, past social history, past surgical history and problem list.  Objective:  There were no vitals filed for this visit.  Pt appears tired/sedated and wearing no mask--given to pt and she requested assistance tying mask on her face.  Poor historian living in group home with care giver not in room and has no paperwork for her client re: meds, allergies, LMP  Physical Exam Constitutional:      Appearance: She is obese.  HENT:     Head: Normocephalic and atraumatic.     Nose: Nose normal.     Mouth/Throat:     Mouth: Mucous membranes are moist.  Neck:     Musculoskeletal: Neck supple.   Pulmonary:     Effort: Pulmonary effort is normal.  Abdominal:     Palpations: Abdomen is soft. There is no mass.     Tenderness: There is abdominal tenderness (verbally c/o mild tenderness lower abdomen to deep palpation without grimacing or guarding).     Hernia: No hernia is present. There is no hernia in the left inguinal area or right inguinal area.  Genitourinary:    General: Normal vulva.     Exam position: Lithotomy position.     Labia:        Right: No lesion.        Left: No lesion.      Vagina: Vaginal discharge (pt c/o pain with light swabbing with Qtip to vaginal wall, red menses appearing blood in vagina, ph<4.5) present.     Cervix: Cervical motion tenderness (pt c/o pain on palpation) present.     Uterus: Tender (pt c/o pain with palpation of uterus; difficult to assess due to increased adipose).      Rectum: Normal.     Comments: hidradennitis supperitiva on bilateral inner groin healing Lymphadenopathy:     Lower Body: No left inguinal adenopathy.  Skin:    General: Skin is warm and dry.       Assessment and Plan:  Cristal GenerousSarah Belle Elsen is a 23 y.o. female presenting [facility] for STI screening  1. Screening examination for venereal disease Treat wet mount per standing orders Immunization nurse consult - WET PREP FOR TRICH, YEAST, CLUE - Pregnancy,  urine - HIV/HCV [facility] [facility] - Syphilis Serology, [place] - Chlamydia/Gonorrhea [place] [facility] - Hepatitis B surface antigen - Hepatitis Serology,  Lab  2. PID (acute pelvic inflammatory disease) Levofloxacin 500 mg daily x 14 days + Metronadazole 500 mg BID x 14 days RTC 06/26/19 for f/u   3. Tobacco use disorder Counseled via 5 A's to stop smoking 1/2-1 ppd  4. Self-inflicted laceration of wrist, unspecified laterality, initial encounter Old scars on arms and thighs  5. Cannabis use disorder, moderate, dependence ([place]) Poor historian--last use 05/2019  6.  Hidradenitis suppurativa Bilateral groin--referred to primary care MD  7. Poor historian Lives in a group home; here with care provider who doesn't know pt's LMP and has no paperwork     Return in about 4 days (around 06/26/2019) for f/u PID.  No future appointments.  Herbie Saxon, CNM

## 2019-06-22 NOTE — Progress Notes (Signed)
Patient here for STD screening and wants pregnancy test. Patient c/o ingrown hair on inner thigh.Jenetta Downer, RN

## 2019-06-23 LAB — HEPATITIS B SURFACE ANTIGEN: Hepatitis B Surface Ag: NEGATIVE

## 2019-06-26 ENCOUNTER — Other Ambulatory Visit: Payer: Self-pay

## 2019-06-26 ENCOUNTER — Ambulatory Visit (LOCAL_COMMUNITY_HEALTH_CENTER): Payer: Medicare Other | Admitting: Physician Assistant

## 2019-06-26 DIAGNOSIS — N739 Female pelvic inflammatory disease, unspecified: Secondary | ICD-10-CM | POA: Diagnosis not present

## 2019-06-26 DIAGNOSIS — Z09 Encounter for follow-up examination after completed treatment for conditions other than malignant neoplasm: Secondary | ICD-10-CM | POA: Diagnosis not present

## 2019-06-26 NOTE — Progress Notes (Signed)
Pt to clinic for PID recheck.

## 2019-06-27 ENCOUNTER — Encounter: Payer: Self-pay | Admitting: Physician Assistant

## 2019-06-27 NOTE — Progress Notes (Signed)
S:  Patient RTC for a PID recheck. States that she is taking her antibiotics without problems.  Reports that she is having some abdominal cramping and wants to know if she can take Ibuprofen for this.   O:  WDWN female in NAD, A&O x3; abdomen=soft, no masses, diffuse tenderness to palpation over whole abdomen; bimanual= uterus firm, mobile, nt, no CMT, no adnexal masses or tenderness. A/P:  1.  PID- resolving with treatment 2.  Counseled to complete all antibiotics as directed 3.  OK to take IB 600 mg po q 6 hr with food for up to 5 days in a row. 4.  Rec follow up with PCP if symptoms persist after completing medicines. 5.  Note made for above directions made in patient's notebook from group home.

## 2019-06-30 LAB — HM HIV SCREENING LAB: HM HIV Screening: NEGATIVE

## 2019-06-30 LAB — HM HEPATITIS C SCREENING LAB: HM Hepatitis Screen: NEGATIVE

## 2019-07-05 NOTE — Addendum Note (Signed)
Addended by: Donnal Moat on: 07/05/2019 08:57 AM   Modules accepted: Orders

## 2019-07-07 ENCOUNTER — Telehealth: Payer: Self-pay | Admitting: Family Medicine

## 2019-07-07 NOTE — Telephone Encounter (Signed)
Patients care giver called wanting patients test rsults

## 2019-07-10 ENCOUNTER — Emergency Department
Admission: EM | Admit: 2019-07-10 | Discharge: 2019-07-11 | Disposition: A | Discharge status: Home / Self Care | Payer: Medicare Other | Attending: Emergency Medicine | Admitting: Emergency Medicine

## 2019-07-10 ENCOUNTER — Other Ambulatory Visit: Payer: Self-pay

## 2019-07-10 DIAGNOSIS — F17210 Nicotine dependence, cigarettes, uncomplicated: Secondary | ICD-10-CM | POA: Diagnosis not present

## 2019-07-10 DIAGNOSIS — Z79899 Other long term (current) drug therapy: Secondary | ICD-10-CM | POA: Insufficient documentation

## 2019-07-10 DIAGNOSIS — K529 Noninfective gastroenteritis and colitis, unspecified: Secondary | ICD-10-CM | POA: Insufficient documentation

## 2019-07-10 DIAGNOSIS — R109 Unspecified abdominal pain: Secondary | ICD-10-CM | POA: Diagnosis present

## 2019-07-10 LAB — URINALYSIS, COMPLETE (UACMP) WITH MICROSCOPIC
Bacteria, UA: NONE SEEN
Bilirubin Urine: NEGATIVE
Glucose, UA: NEGATIVE mg/dL
Ketones, ur: NEGATIVE mg/dL
Leukocytes,Ua: NEGATIVE
Nitrite: NEGATIVE
Protein, ur: 100 mg/dL — AB
Specific Gravity, Urine: 1.02 (ref 1.005–1.030)
pH: 6 (ref 5.0–8.0)

## 2019-07-10 LAB — COMPREHENSIVE METABOLIC PANEL
ALT: 15 U/L (ref 0–44)
AST: 21 U/L (ref 15–41)
Albumin: 4.4 g/dL (ref 3.5–5.0)
Alkaline Phosphatase: 103 U/L (ref 38–126)
Anion gap: 9 (ref 5–15)
BUN: <5 mg/dL — ABNORMAL LOW (ref 6–20)
CO2: 24 mmol/L (ref 22–32)
Calcium: 9.6 mg/dL (ref 8.9–10.3)
Chloride: 105 mmol/L (ref 98–111)
Creatinine, Ser: 0.69 mg/dL (ref 0.44–1.00)
GFR calc Af Amer: >60 mL/min (ref >60)
GFR calc non Af Amer: >60 mL/min (ref >60)
Glucose, Bld: 98 mg/dL (ref 70–99)
Potassium: 3.4 mmol/L — ABNORMAL LOW (ref 3.5–5.1)
Sodium: 138 mmol/L (ref 135–145)
Total Bilirubin: 0.5 mg/dL (ref 0.3–1.2)
Total Protein: 7.6 g/dL (ref 6.5–8.1)

## 2019-07-10 LAB — CBC
HCT: 43.1 % (ref 36.0–46.0)
Hemoglobin: 13.8 g/dL (ref 12.0–15.0)
MCH: 28.5 pg (ref 26.0–34.0)
MCHC: 32 g/dL (ref 30.0–36.0)
MCV: 89 fL (ref 80.0–100.0)
Platelets: 418 10*3/uL — ABNORMAL HIGH (ref 150–400)
RBC: 4.84 MIL/uL (ref 3.87–5.11)
RDW: 14.6 % (ref 11.5–15.5)
WBC: 15.1 10*3/uL — ABNORMAL HIGH (ref 4.0–10.5)
nRBC: 0 % (ref 0.0–0.2)

## 2019-07-10 LAB — LIPASE, BLOOD: Lipase: 41 U/L (ref 11–51)

## 2019-07-10 MED ORDER — KETOROLAC TROMETHAMINE 30 MG/ML IJ SOLN
15.0000 mg | Freq: Once | INTRAMUSCULAR | Status: AC
  Administered 2019-07-10: 15 mg via INTRAVENOUS
  Filled 2019-07-10: qty 1

## 2019-07-10 MED ORDER — SODIUM CHLORIDE 0.9% FLUSH: 3.0000 mL | Freq: Once | INTRAVENOUS | Status: DC

## 2019-07-10 MED ORDER — ONDANSETRON HCL 4 MG/2ML IJ SOLN
4.0000 mg | Freq: Once | INTRAMUSCULAR | Status: AC
  Administered 2019-07-10: 4 mg via INTRAVENOUS
  Filled 2019-07-10: qty 2

## 2019-07-10 NOTE — Telephone Encounter (Signed)
Phone call to pt. Pt knew password. Pt provided results from 06/22/2019 visit.

## 2019-07-10 NOTE — ED Notes (Signed)
Legal guardian Grandville Silos [phone]  Message left.

## 2019-07-10 NOTE — ED Provider Notes (Signed)
[facility] Emergency Department Provider Note  ____________________________________________  Time seen: Approximately 11:53 PM  I have reviewed the triage vital signs and the nursing notes.   HISTORY  Chief Complaint Abdominal Pain   HPI Kimberly Boyle is a 23 y.o. female who presents for evaluation of abdominal pain, vomiting diarrhea.  Patient reports 3 days of diffuse crampy abdominal pain with several daily episodes of nonbloody nonbilious emesis and watery diarrhea.  Today patient reports 2 episodes of bloody emesis and bloody diarrhea.  No coffee-ground emesis, no melena.  She denies any prior history of the same.  Denies history of peptic ulcer disease, NSAID use, alcohol use, history of Crohn's or ulcerative colitis, history of C. difficile.  Her pain is currently 8 out of 10.  No fever or chills. Last bloody BM and emesis were 6 hours ago.  Past Medical History:  Diagnosis Date   Anxiety    Arthritis    Bipolar disorder ([place])    Depression    Hidradenitis suppurativa 06/22/2019   Groin    Major depressive disorder    Panic disorder    PID (acute pelvic inflammatory disease) 06/22/2019   06/22/19    Poor historian 06/22/2019   Schizophrenia ([place])     Patient Active Problem List   Diagnosis Date Noted   Hidradenitis suppurativa 06/22/2019   Poor historian 06/22/2019   PID (acute pelvic inflammatory disease) 06/22/2019   Psychosis ([place]) 05/25/2019   Suicidal ideation 07/20/2018   Tobacco use disorder 05/08/2018   Schizoaffective disorder, bipolar type ([place]) 05/06/2018   Suicide attempt ([place]) 08/20/2017   Cannabis use disorder, moderate, dependence ([place]) 76/19/5093   Self-inflicted laceration of wrist 08/20/2017   Acetaminophen poisoning 08/20/2017   Generalized anxiety disorder 01/05/2017   Depression 01/05/2017    No past surgical history on file.  Prior to Admission medications   Medication Sig Start Date  End Date Taking? Authorizing Provider  clonazePAM (KLONOPIN) 0.5 MG tablet Take 1 tablet (0.5 mg total) by mouth 2 (two) times daily as needed (anxiety). 05/31/18   Pucilowska, Herma Ard B, MD  cloZAPine (CLOZARIL) 100 MG tablet Take 400 mg by mouth at bedtime. 07/15/18   [provider]  clozapine (CLOZARIL) 200 MG tablet Take 2 tablets (400 mg total) by mouth at bedtime. If available, the patient is to take Fazaclo 400 mg nightly instead of Clozapine 400 mg nightly Patient not taking: Reported on 07/21/2018 06/01/18   Pucilowska, Herma Ard B, MD  clozapine (FAZACLO) 100 MG disintegrating tablet Take 4 tablets (400 mg total) by mouth daily. Patient not taking: Reported on 07/21/2018 06/08/18   Pucilowska, Herma Ard B, MD  ibuprofen (ADVIL) 600 MG tablet Take 1 tablet (600 mg total) by mouth every 6 (six) hours as needed. 07/11/19   Alfred Levins, [place], MD  ipratropium (ATROVENT) 0.06 % nasal spray Place 2 sprays into both nostrils at bedtime. Patient not taking: Reported on 06/22/2019 06/08/18   Pucilowska, Herma Ard B, MD  lithium 300 MG tablet Take 750 mg by mouth 2 (two) times daily. 07/01/18   [provider]  lithium carbonate 150 MG capsule Take 5 capsules (750 mg total) by mouth 2 (two) times daily with a meal. Patient not taking: Reported on 07/21/2018 06/01/18   Pucilowska, Herma Ard B, MD  metFORMIN (GLUCOPHAGE) 500 MG tablet Take 1 tablet (500 mg total) by mouth 2 (two) times daily with a meal. Patient not taking: Reported on 06/22/2019 06/01/18   Clovis Fredrickson, MD  metoprolol tartrate (  LOPRESSOR) 25 MG tablet Take 1 tablet (25 mg total) by mouth 2 (two) times daily. Patient not taking: Reported on 06/22/2019 06/08/18   Pucilowska, Ellin Goodie, MD  metroNIDAZOLE (FLAGYL) 500 MG tablet Take 1 tablet (500 mg total) by mouth 3 (three) times daily for 7 days. 07/11/19 07/18/19  Nita Sickle, MD  ondansetron (ZOFRAN ODT) 4 MG disintegrating tablet Take 1 tablet (4 mg total) by mouth  every 8 (eight) hours as needed. 07/11/19   Nita Sickle, MD  paliperidone (INVEGA SUSTENNA) 234 MG/1.5ML SUSY injection Inject 234 mg into the muscle every 28 (twenty-eight) days. Next dose on 07/06/2018 Patient not taking: Reported on 06/22/2019 07/06/18   Pucilowska, Braulio Conte B, MD  sulfamethoxazole-trimethoprim (BACTRIM DS) 800-160 MG tablet Take 1 tablet by mouth 2 (two) times daily for 7 days. 07/11/19 07/18/19  Nita Sickle, MD  VIENVA 0.1-20 MG-MCG tablet Take 1 tablet by mouth daily.  02/10/18   [provider]    Allergies Penicillins  Family History  Problem Relation Age of Onset   Post-traumatic stress disorder Mother    Depression Mother    Depression Maternal Uncle    Cancer Maternal Grandmother     Social History Social History   Tobacco Use   Smoking status: Current Every Day Smoker    Packs/day: 0.50    Years: 11.00    Pack years: 5.50    Types: Cigarettes   Smokeless tobacco: Never Used  Substance Use Topics   Alcohol use: No    Frequency: Never   Drug use: No    Types: Marijuana    Review of Systems  Constitutional: Negative for fever. Eyes: Negative for visual changes. ENT: Negative for sore throat. Neck: No neck pain  Cardiovascular: Negative for chest pain. Respiratory: Negative for shortness of breath. Gastrointestinal: + abdominal pain, vomiting and diarrhea. Genitourinary: Negative for dysuria. Musculoskeletal: Negative for back pain. Skin: Negative for rash. Neurological: Negative for headaches, weakness or numbness. Psych: No SI or HI  ____________________________________________   PHYSICAL EXAM:  VITAL SIGNS: ED Triage Vitals  Enc Vitals Group     BP 07/10/19 2040 124/74     Pulse Rate 07/10/19 2040 (!) 109     Resp 07/10/19 2040 20     Temp 07/10/19 2040 98.7 F (37.1 C)     Temp Source 07/10/19 2040 Oral     SpO2 07/10/19 2040 95 %     Weight --      Height --      Head Circumference --      Peak  Flow --      Pain Score 07/10/19 2041 8     Pain Loc --      Pain Edu? --      Excl. in GC? --     Constitutional: Alert and oriented. Well appearing and in no apparent distress. HEENT:      Head: Normocephalic and atraumatic.         Eyes: Conjunctivae are normal. Sclera is non-icteric.       Mouth/Throat: Mucous membranes are moist.       Neck: Supple with no signs of meningismus. Cardiovascular: Tachycardic with regular rhythm. No murmurs, gallops, or rubs. 2+ symmetrical distal pulses are present in all extremities. No JVD. Respiratory: Normal respiratory effort. Lungs are clear to auscultation bilaterally. No wheezes, crackles, or rhonchi.  Gastrointestinal: Soft, mild diffuse tenderness to palpation, and non distended with positive bowel sounds. No rebound or guarding. Genitourinary: No CVA tenderness. Rectal exam  showing no hemorrhoids, no fissure, brown stool guaiac negatibe Musculoskeletal: Nontender with normal range of motion in all extremities. No edema, cyanosis, or erythema of extremities. Neurologic: Normal speech and language. Face is symmetric. Moving all extremities. No gross focal neurologic deficits are appreciated. Skin: Skin is warm, dry and intact. No rash noted. Psychiatric: Mood and affect are normal. Speech and behavior are normal.  ____________________________________________   LABS (all labs ordered are listed, but only abnormal results are displayed)  Labs Reviewed  COMPREHENSIVE METABOLIC PANEL - Abnormal; Notable for the following components:      Result Value   Potassium 3.4 (*)    BUN <5 (*)    All other components within normal limits  CBC - Abnormal; Notable for the following components:   WBC 15.1 (*)    Platelets 418 (*)    All other components within normal limits  URINALYSIS, COMPLETE (UACMP) WITH MICROSCOPIC - Abnormal; Notable for the following components:   Color, Urine YELLOW (*)    APPearance CLOUDY (*)    Hgb urine dipstick LARGE  (*)    Protein, ur 100 (*)    All other components within normal limits  LIPASE, BLOOD  HCG, QUANTITATIVE, PREGNANCY  HEMOGLOBIN AND HEMATOCRIT, BLOOD  POC URINE PREG, ED   ____________________________________________  EKG  none  ____________________________________________  RADIOLOGY  I have personally reviewed the images performed during this visit and I agree with the Radiologist's read.   Interpretation by Radiologist:  Ct Abdomen Pelvis W Contrast  Result Date: 07/11/2019 CLINICAL DATA:  Acute abdominal pain. Bloody emesis and diarrhea. Diffuse pain. EXAM: CT ABDOMEN AND PELVIS WITH CONTRAST TECHNIQUE: Multidetector CT imaging of the abdomen and pelvis was performed using the standard protocol following bolus administration of intravenous contrast. CONTRAST:  100mL OMNIPAQUE IOHEXOL 300 MG/ML  SOLN COMPARISON:  None. FINDINGS: Lower chest: Minor hypoventilatory atelectasis at the left lung base. No pleural fluid. Hepatobiliary: Minimal focal fatty infiltration adjacent to the falciform ligament. No suspicious hepatic lesion. Gallbladder partially distended, no calcified stone. No biliary dilatation. Pancreas: No ductal dilatation or inflammation. Spleen: Normal in size without focal abnormality. Adrenals/Urinary Tract: Normal adrenal glands. No hydronephrosis or perinephric edema. Homogeneous renal enhancement. Urinary bladder is physiologically distended without wall thickening. Stomach/Bowel: Stomach physiologically distended. No perigastric inflammation or wall thickening. No small bowel obstruction or inflammation. No terminal ileal inflammation. Normal appendix. Mild submucosal fatty infiltration of the ascending colon. Equivocal transverse colonic wall thickening versus nondistention. No pericolonic or perienteric inflammatory change. No mesenteric edema. Vascular/Lymphatic: Portal vein is patent. Abdominal aorta and IVC are intact. Few prominent ileocolic nodes measuring up to 10  mm, likely reactive. Reproductive: Uterus and bilateral adnexa are unremarkable. Other: No free air, free fluid, or intra-abdominal fluid collection. Small fat containing umbilical hernia. Musculoskeletal: There are no acute or suspicious osseous abnormalities. IMPRESSION: Equivocal colonic wall thickening of the transverse colon without significant pericolonic edema, possible colitis. No other acute findings in the abdomen/pelvis. Electronically Signed   By: Narda RutherfordMelanie  Sanford M.D.   On: 07/11/2019 01:02     ____________________________________________   PROCEDURES  Procedure(s) performed: None Procedures Critical Care performed:  None ____________________________________________   INITIAL IMPRESSION / ASSESSMENT AND PLAN / ED COURSE  [age] y.o. female who presents for evaluation of diffuse abdominal pain, vomiting, and diarrhea.  Patient is well-appearing in no distress, slightly tachycardic but otherwise hemodynamically stable, abdomen is soft with mild diffuse tenderness.  Rectal exam showing brown stool guaiac negative.  Labs show stable hemoglobin of  13.8.  Leukocytosis with white count of 15.1.  Differential diagnoses including colitis versus IBD versus C. difficile versus gastroenteritis versus PUD versus gastritis.  Plan for CT, Toradol and Zofran for symptom relief.    _________________________ 2:13 AM on 07/11/2019 -----------------------------------------  CT consistent with colitis.  Patient with no further episodes of bloody emesis or diarrhea for 8 hours.  He remains hemodynamically stable.  Repeat hemoglobin unchanged from initial.  Pain is well controlled.  Patient was started on Bactrim and Flagyl.  Will discharge home on 7-day course.  Discussed my standard return precautions and close follow-up with PCP.    As part of my medical decision making, I reviewed the following data within the electronic MEDICAL RECORD NUMBER Nursing notes reviewed and incorporated, Labs reviewed , Old  chart reviewed, Radiograph reviewed , Notes from prior ED visits and [place] Controlled Substance Database   Patient was evaluated in Emergency Department today for the symptoms described in the history of present illness. Patient was evaluated in the context of the global COVID-19 pandemic, which necessitated consideration that the patient might be at risk for infection with the SARS-CoV-2 virus that causes COVID-19. Institutional protocols and algorithms that pertain to the evaluation of patients at risk for COVID-19 are in a state of rapid change based on information released by regulatory bodies including the CDC and federal and state organizations. These policies and algorithms were followed during the patient's care in the ED.   ____________________________________________   FINAL CLINICAL IMPRESSION(S) / ED DIAGNOSES   Final diagnoses:  Colitis      NEW MEDICATIONS STARTED DURING THIS VISIT:  ED Discharge Orders         Ordered    sulfamethoxazole-trimethoprim (BACTRIM DS) 800-160 MG tablet  2 times daily     07/11/19 0212    metroNIDAZOLE (FLAGYL) 500 MG tablet  3 times daily     07/11/19 0212    ondansetron (ZOFRAN ODT) 4 MG disintegrating tablet  Every 8 hours PRN     07/11/19 0212    ibuprofen (ADVIL) 600 MG tablet  Every 6 hours PRN     07/11/19 0212           Note:  This document was prepared using Dragon voice recognition software and may include unintentional dictation errors.    Don Perking, [place], MD 07/11/19 [phone]

## 2019-07-10 NOTE — ED Triage Notes (Addendum)
Pt brought in via ems from home with abd pain.  Ems reports blood in emesis and stools.  Sx began today.  Pt alert  Speech clear.   Pt is from a group home .

## 2019-07-10 NOTE — ED Notes (Signed)
ED Provider at bedside. 

## 2019-07-10 NOTE — Telephone Encounter (Signed)
Phone call to pt.

## 2019-07-11 ENCOUNTER — Encounter: Payer: Self-pay | Admitting: Radiology

## 2019-07-11 ENCOUNTER — Emergency Department: Payer: Medicare Other

## 2019-07-11 DIAGNOSIS — K529 Noninfective gastroenteritis and colitis, unspecified: Secondary | ICD-10-CM | POA: Diagnosis not present

## 2019-07-11 LAB — HEMOGLOBIN AND HEMATOCRIT, BLOOD
HCT: 42 % (ref 36.0–46.0)
Hemoglobin: 13.2 g/dL (ref 12.0–15.0)

## 2019-07-11 LAB — HCG, QUANTITATIVE, PREGNANCY: hCG, Beta Chain, Quant, S: <1 m[IU]/mL (ref <5)

## 2019-07-11 MED ORDER — SULFAMETHOXAZOLE-TRIMETHOPRIM 800-160 MG PO TABS: 1.0000 | ORAL_TABLET | Freq: Two times a day (BID) | ORAL | 0 refills | Status: AC

## 2019-07-11 MED ORDER — SULFAMETHOXAZOLE-TRIMETHOPRIM 800-160 MG PO TABS
1.0000 | ORAL_TABLET | Freq: Once | ORAL | Status: AC
  Administered 2019-07-11: 02:00:00 1 via ORAL
  Filled 2019-07-11: qty 1

## 2019-07-11 MED ORDER — METRONIDAZOLE 500 MG PO TABS: 500.0000 mg | ORAL_TABLET | Freq: Three times a day (TID) | ORAL | 0 refills | Status: AC

## 2019-07-11 MED ORDER — METRONIDAZOLE 500 MG PO TABS
500.0000 mg | ORAL_TABLET | Freq: Once | ORAL | Status: AC
  Administered 2019-07-11: 02:00:00 500 mg via ORAL
  Filled 2019-07-11: qty 1

## 2019-07-11 MED ORDER — IBUPROFEN 600 MG PO TABS: 600.0000 mg | ORAL_TABLET | Freq: Four times a day (QID) | ORAL | 0 refills | Status: DC | PRN

## 2019-07-11 MED ORDER — IOHEXOL 300 MG/ML  SOLN
100.0000 mL | Freq: Once | INTRAMUSCULAR | Status: AC | PRN
  Administered 2019-07-11: 100 mL via INTRAVENOUS

## 2019-07-11 MED ORDER — ONDANSETRON 4 MG PO TBDP: 4.0000 mg | ORAL_TABLET | Freq: Three times a day (TID) | ORAL | 0 refills | Status: DC | PRN

## 2019-07-11 NOTE — ED Notes (Signed)
Attempted to contact [place], patient's legal guardian from [place] without success. Called multiple times, mailbox is full. Attempted to call Kylie, the legal guardian's supervisor without success as well. Patient does not have legal guardian's contact information

## 2019-07-11 NOTE — ED Notes (Signed)
Patient continues to ask to eat and drink

## 2019-07-11 NOTE — ED Notes (Signed)
Patient transported to CT 

## 2019-07-18 LAB — WET PREP FOR TRICH, YEAST, CLUE
Trichomonas Exam: NEGATIVE
Yeast Exam: NEGATIVE

## 2019-07-28 ENCOUNTER — Other Ambulatory Visit: Payer: Medicaid Other

## 2019-08-11 ENCOUNTER — Other Ambulatory Visit: Payer: Self-pay

## 2019-08-11 ENCOUNTER — Emergency Department
Admission: EM | Admit: 2019-08-11 | Discharge: 2019-08-14 | Disposition: A | Discharge status: Other Acute Inpatient Hospital | Payer: Medicare Other | Attending: Emergency Medicine | Admitting: Emergency Medicine

## 2019-08-11 ENCOUNTER — Encounter: Payer: Self-pay | Admitting: Emergency Medicine

## 2019-08-11 DIAGNOSIS — Z20828 Contact with and (suspected) exposure to other viral communicable diseases: Secondary | ICD-10-CM | POA: Insufficient documentation

## 2019-08-11 DIAGNOSIS — R45851 Suicidal ideations: Secondary | ICD-10-CM | POA: Diagnosis not present

## 2019-08-11 DIAGNOSIS — R197 Diarrhea, unspecified: Secondary | ICD-10-CM | POA: Diagnosis not present

## 2019-08-11 DIAGNOSIS — F79 Unspecified intellectual disabilities: Secondary | ICD-10-CM | POA: Diagnosis not present

## 2019-08-11 DIAGNOSIS — F203 Undifferentiated schizophrenia: Secondary | ICD-10-CM | POA: Diagnosis not present

## 2019-08-11 DIAGNOSIS — F17210 Nicotine dependence, cigarettes, uncomplicated: Secondary | ICD-10-CM | POA: Insufficient documentation

## 2019-08-11 DIAGNOSIS — F329 Major depressive disorder, single episode, unspecified: Secondary | ICD-10-CM | POA: Diagnosis present

## 2019-08-11 DIAGNOSIS — Z79899 Other long term (current) drug therapy: Secondary | ICD-10-CM | POA: Diagnosis not present

## 2019-08-11 DIAGNOSIS — R112 Nausea with vomiting, unspecified: Secondary | ICD-10-CM

## 2019-08-11 LAB — CBC WITH DIFFERENTIAL/PLATELET
Abs Immature Granulocytes: 0.05 10*3/uL (ref 0.00–0.07)
Basophils Absolute: 0 10*3/uL (ref 0.0–0.1)
Basophils Relative: 0 %
Eosinophils Absolute: 0 10*3/uL (ref 0.0–0.5)
Eosinophils Relative: 0 %
HCT: 42.4 % (ref 36.0–46.0)
Hemoglobin: 13.6 g/dL (ref 12.0–15.0)
Immature Granulocytes: 0 %
Lymphocytes Relative: 16 %
Lymphs Abs: 2.2 10*3/uL (ref 0.7–4.0)
MCH: 28.6 pg (ref 26.0–34.0)
MCHC: 32.1 g/dL (ref 30.0–36.0)
MCV: 89.3 fL (ref 80.0–100.0)
Monocytes Absolute: 1.1 10*3/uL — ABNORMAL HIGH (ref 0.1–1.0)
Monocytes Relative: 8 %
Neutro Abs: 10.9 10*3/uL — ABNORMAL HIGH (ref 1.7–7.7)
Neutrophils Relative %: 76 %
Platelets: 384 10*3/uL (ref 150–400)
RBC: 4.75 MIL/uL (ref 3.87–5.11)
RDW: 13.3 % (ref 11.5–15.5)
WBC: 14.3 10*3/uL — ABNORMAL HIGH (ref 4.0–10.5)
nRBC: 0 % (ref 0.0–0.2)

## 2019-08-11 LAB — ACETAMINOPHEN LEVEL: Acetaminophen (Tylenol), Serum: <10 ug/mL — ABNORMAL LOW (ref 10–30)

## 2019-08-11 LAB — URINE DRUG SCREEN, QUALITATIVE (ARMC ONLY)
Amphetamines, Ur Screen: NOT DETECTED
Barbiturates, Ur Screen: NOT DETECTED
Benzodiazepine, Ur Scrn: NOT DETECTED
Cannabinoid 50 Ng, Ur ~~LOC~~: NOT DETECTED
Cocaine Metabolite,Ur ~~LOC~~: NOT DETECTED
MDMA (Ecstasy)Ur Screen: NOT DETECTED
Methadone Scn, Ur: NOT DETECTED
Opiate, Ur Screen: NOT DETECTED
Phencyclidine (PCP) Ur S: NOT DETECTED
Tricyclic, Ur Screen: NOT DETECTED

## 2019-08-11 LAB — COMPREHENSIVE METABOLIC PANEL
ALT: 19 U/L (ref 0–44)
AST: 21 U/L (ref 15–41)
Albumin: 4.4 g/dL (ref 3.5–5.0)
Alkaline Phosphatase: 96 U/L (ref 38–126)
Anion gap: 10 (ref 5–15)
BUN: 8 mg/dL (ref 6–20)
CO2: 26 mmol/L (ref 22–32)
Calcium: 9.9 mg/dL (ref 8.9–10.3)
Chloride: 101 mmol/L (ref 98–111)
Creatinine, Ser: 0.82 mg/dL (ref 0.44–1.00)
GFR calc Af Amer: >60 mL/min (ref >60)
GFR calc non Af Amer: >60 mL/min (ref >60)
Glucose, Bld: 90 mg/dL (ref 70–99)
Potassium: 3.3 mmol/L — ABNORMAL LOW (ref 3.5–5.1)
Sodium: 137 mmol/L (ref 135–145)
Total Bilirubin: 0.5 mg/dL (ref 0.3–1.2)
Total Protein: 7.7 g/dL (ref 6.5–8.1)

## 2019-08-11 LAB — ETHANOL: Alcohol, Ethyl (B): <10 mg/dL (ref <10)

## 2019-08-11 MED ORDER — ONDANSETRON 4 MG PO TBDP
4.0000 mg | ORAL_TABLET | Freq: Once | ORAL | Status: AC
  Administered 2019-08-11: 4 mg via ORAL
  Filled 2019-08-11: qty 1

## 2019-08-11 MED ORDER — POTASSIUM CHLORIDE CRYS ER 20 MEQ PO TBCR
40.0000 meq | EXTENDED_RELEASE_TABLET | Freq: Once | ORAL | Status: AC
  Administered 2019-08-11: 22:00:00 40 meq via ORAL
  Filled 2019-08-11: qty 2

## 2019-08-11 NOTE — ED Triage Notes (Signed)
Pt arrives via [place] [employer] with IVC papers for suicidal plan to jump in front of car. Pt brought in from [facility]

## 2019-08-11 NOTE — ED Notes (Signed)
Pt removes jewelry, contents include bar earring, 4 grey metal rings, necklace. Pt's other belongings include pants, phone charger, phone, cigarettes, lighter, 2 packs of matches, 2 pennies, pair of flip flops, ID

## 2019-08-11 NOTE — ED Provider Notes (Signed)
[facility] Emergency Department Provider Note  ____________________________________________   First MD Initiated Contact with Patient 08/11/19 2023     (approximate)  I have reviewed the triage vital signs and the nursing notes.   HISTORY  Chief Complaint IVC    HPI Cristal GenerousSarah Belle Mascaro is a 23 y.o. female with bipolar, depression who presents with IVC papers.  Patient had a plan to jump in front of a car due to suicidal ideations.  Patient was brought in from [facility].  Patient says she has had these thoughts due to getting an argument with her caregiver.  Patient was seen on 9/28 for some diarrhea and vomiting.  CT scan was concerning for colitis patient was started on Bactrim and Flagyl.  Patient says she took these.  Patient denies any abdominal pain but says she still continue to have some intermittent nausea.  She requested nausea medicine earlier today.  She also does continue to have some diarrhea maybe 2 episodes a day.          Past Medical History:  Diagnosis Date  . Anxiety   . Arthritis   . Bipolar disorder (HCC)   . Depression   . Hidradenitis suppurativa 06/22/2019   Groin   . Major depressive disorder   . Panic disorder   . PID (acute pelvic inflammatory disease) 06/22/2019   06/22/19   . Poor historian 06/22/2019  . Schizophrenia [facility](HCC)     Patient Active Problem List   Diagnosis Date Noted  . Hidradenitis suppurativa 06/22/2019  . Poor historian 06/22/2019  . PID (acute pelvic inflammatory disease) 06/22/2019  . Psychosis (HCC) 05/25/2019  . Suicidal ideation 07/20/2018  . Tobacco use disorder 05/08/2018  . Schizoaffective disorder, bipolar type (HCC) 05/06/2018  . Suicide attempt (HCC) 08/20/2017  . Cannabis use disorder, moderate, dependence (HCC) 08/20/2017  . Self-inflicted laceration of wrist (HCC) 08/20/2017  . Acetaminophen poisoning 08/20/2017  . Generalized anxiety disorder 01/05/2017  . Depression 01/05/2017     History reviewed. No pertinent surgical history.  Prior to Admission medications   Medication Sig Start Date End Date Taking? Authorizing Provider  clonazePAM (KLONOPIN) 0.5 MG tablet Take 1 tablet (0.5 mg total) by mouth 2 (two) times daily as needed (anxiety). 05/31/18   Pucilowska, Braulio ConteJolanta B, MD  cloZAPine (CLOZARIL) 100 MG tablet Take 400 mg by mouth at bedtime. 07/15/18   [provider]  clozapine (CLOZARIL) 200 MG tablet Take 2 tablets (400 mg total) by mouth at bedtime. If available, the patient is to take Fazaclo 400 mg nightly instead of Clozapine 400 mg nightly Patient not taking: Reported on 07/21/2018 06/01/18   Pucilowska, Braulio ConteJolanta B, MD  clozapine (FAZACLO) 100 MG disintegrating tablet Take 4 tablets (400 mg total) by mouth daily. Patient not taking: Reported on 07/21/2018 06/08/18   Pucilowska, Braulio ConteJolanta B, MD  ibuprofen (ADVIL) 600 MG tablet Take 1 tablet (600 mg total) by mouth every 6 (six) hours as needed. 07/11/19   Don PerkingVeronese, [place], MD  ipratropium (ATROVENT) 0.06 % nasal spray Place 2 sprays into both nostrils at bedtime. Patient not taking: Reported on 06/22/2019 06/08/18   Pucilowska, Braulio ConteJolanta B, MD  lithium 300 MG tablet Take 750 mg by mouth 2 (two) times daily. 07/01/18   [provider]  lithium carbonate 150 MG capsule Take 5 capsules (750 mg total) by mouth 2 (two) times daily with a meal. Patient not taking: Reported on 07/21/2018 06/01/18   Pucilowska, Braulio ConteJolanta B, MD  metFORMIN (GLUCOPHAGE) 500 MG  tablet Take 1 tablet (500 mg total) by mouth 2 (two) times daily with a meal. Patient not taking: Reported on 06/22/2019 06/01/18   Pucilowska, Herma Ard B, MD  metoprolol tartrate (LOPRESSOR) 25 MG tablet Take 1 tablet (25 mg total) by mouth 2 (two) times daily. Patient not taking: Reported on 06/22/2019 06/08/18   Pucilowska, Herma Ard B, MD  ondansetron (ZOFRAN ODT) 4 MG disintegrating tablet Take 1 tablet (4 mg total) by mouth every 8 (eight) hours as needed. 07/11/19    Rudene Re, MD  paliperidone (INVEGA SUSTENNA) 234 MG/1.5ML SUSY injection Inject 234 mg into the muscle every 28 (twenty-eight) days. Next dose on 07/06/2018 Patient not taking: Reported on 06/22/2019 07/06/18   Pucilowska, Wardell Honour, MD  VIENVA 0.1-20 MG-MCG tablet Take 1 tablet by mouth daily.  02/10/18   [provider]    Allergies Penicillins  Family History  Problem Relation Age of Onset  . Post-traumatic stress disorder Mother   . Depression Mother   . Depression Maternal Uncle   . Cancer Maternal Grandmother     Social History Social History   Tobacco Use  . Smoking status: Current Every Day Smoker    Packs/day: 0.50    Years: 11.00    Pack years: 5.50    Types: Cigarettes  . Smokeless tobacco: Never Used  Substance Use Topics  . Alcohol use: No    Frequency: Never  . Drug use: No    Types: Marijuana      Review of Systems Constitutional: No fever/chills Eyes: No visual changes. ENT: No sore throat. Cardiovascular: Denies chest pain. Respiratory: Denies shortness of breath. Gastrointestinal: No abdominal pain.  Some nausea and diarrhea no constipation. Genitourinary: Negative for dysuria. Musculoskeletal: Negative for back pain. Skin: Negative for rash. Neurological: Negative for headaches, focal weakness or numbness. All other ROS negative ____________________________________________   PHYSICAL EXAM:  VITAL SIGNS: ED Triage Vitals  Enc Vitals Group     BP 08/11/19 1935 (!) 147/101     Pulse Rate 08/11/19 1935 88     Resp 08/11/19 1935 17     Temp 08/11/19 1935 99.3 F (37.4 C)     Temp Source 08/11/19 1935 Oral     SpO2 08/11/19 1935 96 %     Weight 08/11/19 1936 209 lb (94.8 kg)     Height 08/11/19 1936 5\' 3"  (1.6 m)     Head Circumference --      Peak Flow --      Pain Score 08/11/19 1935 6     Pain Loc --      Pain Edu? --      Excl. in [place]? --     Constitutional: Alert and oriented. Well appearing and in no acute  distress. Eyes: Conjunctivae are normal. EOMI. Head: Atraumatic. Nose: No congestion/rhinnorhea. Mouth/Throat: Mucous membranes are moist.   Neck: No stridor. Trachea Midline. FROM Cardiovascular: Normal rate, regular rhythm. Grossly normal heart sounds.  Good peripheral circulation. Respiratory: Normal respiratory effort.  No retractions. Lungs CTAB. Gastrointestinal: Soft and nontender. No distention. No abdominal bruits.  Musculoskeletal: No lower extremity tenderness nor edema.  No joint effusions. Neurologic:  Normal speech and language. No gross focal neurologic deficits are appreciated.  Skin:  Skin is warm, dry and intact. No rash noted. Psychiatric: Mood and affect are normal. Speech and behavior are normal.  SI and baseline auditory hallucinations that are command GU: Deferred   ____________________________________________   LABS (all labs ordered are listed, but only abnormal results  are displayed)  Labs Reviewed  CBC WITH DIFFERENTIAL/PLATELET - Abnormal; Notable for the following components:      Result Value   WBC 14.3 (*)    Neutro Abs 10.9 (*)    Monocytes Absolute 1.1 (*)    All other components within normal limits  COMPREHENSIVE METABOLIC PANEL - Abnormal; Notable for the following components:   Potassium 3.3 (*)    All other components within normal limits  ACETAMINOPHEN LEVEL - Abnormal; Notable for the following components:   Acetaminophen (Tylenol), Serum <10 (*)    All other components within normal limits  ETHANOL  URINE DRUG SCREEN, QUALITATIVE ([facility] ONLY)  URINALYSIS, COMPLETE (UACMP) WITH MICROSCOPIC  POC URINE PREG, ED   ____________________________________________   INITIAL IMPRESSION / ASSESSMENT AND PLAN / ED COURSE  Paddy Walthall was evaluated in Emergency Department on 08/11/2019 for the symptoms described in the history of present illness. She was evaluated in the context of the global COVID-19 pandemic, which necessitated  consideration that the patient might be at risk for infection with the SARS-CoV-2 virus that causes COVID-19. Institutional protocols and algorithms that pertain to the evaluation of patients at risk for COVID-19 are in a state of rapid change based on information released by regulatory bodies including the CDC and federal and state organizations. These policies and algorithms were followed during the patient's care in the ED.     Pt does have some vague nausea, vomiting, diarrhea.  Prior CT scan showed colitis.  Patient has no abdominal tenderness at this time to suggest need to rescan it.  Will send off stool studies if patient is able to give a stool sample.  Patient requesting food given her reassuring exam will continue to closely monitor.    No exam findings to suggest medical cause of current presentation. Will order psychiatric screening labs and discuss further w/ psychiatric service.  D/d includes but is not limited to psychiatric disease, behavioral/personality disorder, inadequate socioeconomic support, medical.  Based on HPI, exam, unremarkable labs, no concern for acute medical problem at this time. No rigidity, clonus, hyperthermia, focal neurologic deficit, diaphoresis, tachycardia, meningismus, ataxia, gait abnormality or other finding to suggest this visit represents a non-psychiatric problem. Screening labs reviewed.    Given this, pt medically cleared, to be dispositioned per Psych.   Labs notable for slightly elevated white count but denies any infectious symptoms.  This is around patient's baseline.  Potassium slightly low at 3.3.  Will give 20 p.o.  We will order stool studies.  Handoff to incoming team.  At this time any nothing patient needs CT scan given soft with normal exam.  Patient tolerating p.o.  Would discontinue stool studies and take all precautions of patient is unable to give a stool in the next 24 hours.    ____________________________________________    FINAL CLINICAL IMPRESSION(S) / ED DIAGNOSES   Final diagnoses:  Nausea and vomiting, intractability of vomiting not specified, unspecified vomiting type  Diarrhea, unspecified type      MEDICATIONS GIVEN DURING THIS VISIT:  Medications  ondansetron (ZOFRAN-ODT) disintegrating tablet 4 mg (4 mg Oral Given 08/11/19 1944)     ED Discharge Orders    None       Note:  This document was prepared using Dragon voice recognition software and may include unintentional dictation errors.   Concha Se, MD 08/11/19 [phone]

## 2019-08-12 DIAGNOSIS — F203 Undifferentiated schizophrenia: Secondary | ICD-10-CM | POA: Diagnosis not present

## 2019-08-12 LAB — URINALYSIS, COMPLETE (UACMP) WITH MICROSCOPIC
Bilirubin Urine: NEGATIVE
Glucose, UA: NEGATIVE mg/dL
Hgb urine dipstick: NEGATIVE
Ketones, ur: NEGATIVE mg/dL
Nitrite: NEGATIVE
Protein, ur: NEGATIVE mg/dL
Specific Gravity, Urine: 1.005 (ref 1.005–1.030)
pH: 7 (ref 5.0–8.0)

## 2019-08-12 LAB — PREGNANCY, URINE: Preg Test, Ur: NEGATIVE

## 2019-08-12 LAB — SARS CORONAVIRUS 2 BY RT PCR (HOSPITAL ORDER, PERFORMED IN ~~LOC~~ HOSPITAL LAB): SARS Coronavirus 2: NEGATIVE

## 2019-08-12 MED ORDER — ONDANSETRON 4 MG PO TBDP
4.0000 mg | ORAL_TABLET | Freq: Once | ORAL | Status: AC
  Administered 2019-08-12: 4 mg via ORAL
  Filled 2019-08-12 (×2): qty 1

## 2019-08-12 MED ORDER — HYDROXYZINE HCL 25 MG PO TABS
25.0000 mg | ORAL_TABLET | Freq: Once | ORAL | Status: AC
  Administered 2019-08-12: 20:00:00 25 mg via ORAL
  Filled 2019-08-12: qty 1

## 2019-08-12 MED ORDER — ALUM & MAG HYDROXIDE-SIMETH 200-200-20 MG/5ML PO SUSP
30.0000 mL | Freq: Once | ORAL | Status: AC
  Administered 2019-08-12: 01:00:00 30 mL via ORAL
  Filled 2019-08-12: qty 30

## 2019-08-12 NOTE — ED Notes (Signed)
Hourly rounding reveals patient sleeping in room. No complaints, stable, in no acute distress. Q15 minute rounds and monitoring via Security Cameras to continue. 

## 2019-08-12 NOTE — ED Notes (Signed)
On call social worker returned this writer's phone call.  SW stated they already knew about the patient's admission to [facility].

## 2019-08-12 NOTE — ED Notes (Signed)
Pt. Requested to use bathroom, bathroom unlocked.

## 2019-08-12 NOTE — ED Provider Notes (Signed)
-----------------------------------------   8:56 AM on 08/12/2019 -----------------------------------------   BP 125/83 (BP Location: Right Arm)   Pulse 77   Temp 98.1 F (36.7 C) (Oral)   Resp 18   Ht 5\' 3"  (1.6 m)   Wt 94.8 kg   LMP 08/06/2019 (Approximate)   SpO2 96%   BMI 37.02 kg/m   The patient is calm and cooperative at this time.  There have been no acute events since the last update. Patient has been accepted at [facility], awaiting transport.   Lilia Pro., MD 08/12/19 [phone]

## 2019-08-12 NOTE — ED Notes (Signed)
IVC, accepted to [facility], no transport today.  Will verify bed status in the morning 11.1 before coordinating transport

## 2019-08-12 NOTE — ED Notes (Signed)
Pt taken to consult room for interview with TTS. EDT Zach with pt.

## 2019-08-12 NOTE — ED Notes (Signed)
[facility] rep. Called information given.

## 2019-08-12 NOTE — ED Notes (Signed)
Patient asking for "something for anxiety".

## 2019-08-12 NOTE — ED Notes (Addendum)
Patient has been accepted to [facility].  Patient assigned to room [facility] A unit Accepting physician is Dr. Enzo Bi .  Call report to [phone].  Representative was [place] .   ER Staff is aware of it:  ER Secretary  Dr.  ER MD  Catalina Antigua Patient's Nurse  Pt to be transported after 9am   Please transport the pt the [facility] for temperature screening

## 2019-08-12 NOTE — ED Notes (Signed)
Meal tray placed in room 

## 2019-08-12 NOTE — ED Notes (Signed)
RN called [employer] (pt's legal guardian). On call social worker to call back.

## 2019-08-12 NOTE — BH Assessment (Signed)
Assessment Note  Kimberly Boyle is an 23 y.o. female. Who Pt arrives via [place] [employer] with IVC papers for suicidal plan to jump in front of car. Pt brought in from [facility] do to manic behaviors. Pt currently resides in a group home ([facility]). Patient says she has had these thoughts due to getting an argument with her caregiver. She reports medication compliance but states that she has experienced suicidal ideation for two days. Pt. States that she is actively suicidal . Pt. denies the presence of any auditory or visual hallucinations at this time. Patient denies any other medical complaints.    Diagnosis: Schizophrenia   Past Medical History:  Past Medical History:  Diagnosis Date  . Anxiety   . Arthritis   . Bipolar disorder (HCC)   . Depression   . Hidradenitis suppurativa 06/22/2019   Groin   . Major depressive disorder   . Panic disorder   . PID (acute pelvic inflammatory disease) 06/22/2019   06/22/19   . Poor historian 06/22/2019  . Schizophrenia (HCC)     History reviewed. No pertinent surgical history.  Family History:  Family History  Problem Relation Age of Onset  . Post-traumatic stress disorder Mother   . Depression Mother   . Depression Maternal Uncle   . Cancer Maternal Grandmother     Social History:  reports that she has been smoking cigarettes. She has a 5.50 pack-year smoking history. She has never used smokeless tobacco. She reports that she does not drink alcohol or use drugs.  Additional Social History:  Alcohol / Drug Use Pain Medications: See MAR Prescriptions: See MAR Over the Counter: See MAR History of alcohol / drug use?: No history of alcohol / drug abuse  CIWA: CIWA-Ar BP: 125/83 Pulse Rate: 77 COWS:    Allergies:  Allergies  Allergen Reactions  . Penicillins Diarrhea and Nausea Only    Has patient had a PCN reaction causing immediate rash, facial/tongue/throat swelling, SOB or lightheadedness with hypotension:  no Has patient had a PCN  reaction causing severe rash involving mucus membranes or skin necrosis:  no Has patient had a PCN reaction that required hospitalization: no Has patient had a PCN reaction occurring within the last 10 years: no If all of the above answers are "NO", then may proceed with Cephalosporin use.     Home Medications: (Not in a hospital admission)   OB/GYN Status:  Patient's last menstrual period was 08/06/2019 (approximate).  General Assessment Data Location of Assessment: [facility] ED TTS Assessment: In system Is this a Tele or Face-to-Face Assessment?: Tele Assessment Is this an Initial Assessment or a Re-assessment for this encounter?: Initial Assessment Patient Accompanied by:: N/A Living Arrangements: In Group Home: (Comment: Name of Group Home) What gender do you identify as?: Female Living Arrangements: Group Home Can pt return to current living arrangement?: Yes Admission Status: Involuntary Referral Source: Self/Family/Friend Insurance type: Medicare  Medical Screening Exam [facility] Walk-in ONLY) Medical Exam completed: Yes  Crisis Care Plan Living Arrangements: Group Home Legal Guardian: Other: Name of Psychiatrist: Group Home  Name of Therapist: Group Home   Education Status Is patient currently in school?: No Is the patient employed, unemployed or receiving disability?: Receiving disability income  Risk to self with the past 6 months Suicidal Ideation: Yes-Currently Present Has patient been a risk to self within the past 6 months prior to admission? : Yes Suicidal Intent: Yes-Currently Present Has patient had any suicidal intent within the past 6 months prior to admission? :  Yes Is patient at risk for suicide?: Yes Suicidal Plan?: Yes-Currently Present Has patient had any suicidal plan within the past 6 months prior to admission? : Yes Specify Current Suicidal Plan: Run in front of a care  Access to Means: Yes What has been your use of drugs/alcohol within the last 12  months?: none Previous Attempts/Gestures: Yes How many times?: 0 Other Self Harm Risks: hx of cutting and burning  Intentional Self Injurious Behavior: Burning, Cutting Family Suicide History: Unknown Recent stressful life event(s): Conflict (Comment) Persecutory voices/beliefs?: No Depression: Yes Depression Symptoms: Loss of interest in usual pleasures, Feeling worthless/self pity Substance abuse history and/or treatment for substance abuse?: No Suicide prevention information given to non-admitted patients: Not applicable  Risk to Others within the past 6 months Homicidal Ideation: No Does patient have any lifetime risk of violence toward others beyond the six months prior to admission? : No Thoughts of Harm to Others: No Current Homicidal Intent: No Current Homicidal Plan: No Identified Victim: none History of harm to others?: No Assessment of Violence: None Noted Violent Behavior Description: none Does patient have access to weapons?: No Criminal Charges Pending?: No Does patient have a court date: No Is patient on probation?: No  Psychosis Hallucinations: None noted Delusions: None noted  Mental Status Report Appearance/Hygiene: In scrubs Eye Contact: Poor Motor Activity: Freedom of movement Speech: Soft Level of Consciousness: Alert Mood: Depressed Affect: Anxious Anxiety Level: Minimal Thought Processes: Relevant Judgement: Partial Orientation: Time, Place, Person, Situation Obsessive Compulsive Thoughts/Behaviors: None  Cognitive Functioning Concentration: Poor Memory: Remote Intact, Recent Intact Insight: Poor Impulse Control: Poor Appetite: Fair Have you had any weight changes? : No Change Sleep: No Change Total Hours of Sleep: 7 Vegetative Symptoms: None  ADLScreening [facility] Assessment Services) Patient's cognitive ability adequate to safely complete daily activities?: Yes Patient able to express need for assistance with ADLs?: Yes Independently  performs ADLs?: Yes (appropriate for developmental age)  Prior Inpatient Therapy Prior Inpatient Therapy: Yes Prior Therapy Dates: 04/2018 Prior Therapy Facilty/Provider(s): [facility] Reason for Treatment: depression  Prior Outpatient Therapy Prior Outpatient Therapy: No Does patient have an ACCT team?: Yes Does patient have Intensive In-House Services?  : No Does patient have [facility] services? : No Does patient have P4CC services?: No  ADL Screening (condition at time of admission) Patient's cognitive ability adequate to safely complete daily activities?: Yes Patient able to express need for assistance with ADLs?: Yes Independently performs ADLs?: Yes (appropriate for developmental age)       Abuse/Neglect Assessment (Assessment to be complete while patient is alone) Abuse/Neglect Assessment Can Be Completed: Yes Physical Abuse: Denies Verbal Abuse: Yes, present (Comment) Sexual Abuse: Denies Exploitation of patient/patient's resources: Denies Self-Neglect: Denies Possible abuse reported to:: [place] department of social services Values / Beliefs Cultural Requests During Hospitalization: None Spiritual Requests During Hospitalization: None Consults Spiritual Care Consult Needed: No Social Work Consult Needed: No            Disposition:  Disposition Initial Assessment Completed for this Encounter: Yes Disposition of Patient: Admit  On Site Evaluation by:   Reviewed with Physician:    Laretta Alstrom 08/12/2019 4:50 AM

## 2019-08-12 NOTE — ED Notes (Signed)
Pt complaining of indigestion and requesting medication for same. Dr Owens Shark informed and new order received.

## 2019-08-12 NOTE — ED Notes (Signed)
RN attempted to call report to [facility]. Accepting RN to call back when available.

## 2019-08-12 NOTE — ED Notes (Signed)
Report to include Situation, Background, Assessment, and Recommendations received from Amy B. RN. Patient alert and oriented, warm and dry, in no acute distress. Patient denies HI, VH and pain. Patient states she has SI to run in front of a car and hearing voices telling her to run in front of a car.Patient made aware of Q15 minute rounds and security cameras for their safety. Patient instructed to come to me with needs or concerns.

## 2019-08-12 NOTE — ED Notes (Signed)
This RN spoke with Claiborne Billings with [facility] and notified her of pt in the ED and pending in patient admission to adult mental health facility.

## 2019-08-12 NOTE — ED Notes (Signed)
Pt. Introduced to unit.  Pt. States "I have been here before".  Pt. Reminded bathroom is locked and she only needs to come to nursing station to have it unlocked.  Pt. Also reminded of cameras, safety checks.  Pt. Requested and was given a cup of water and tv remote.

## 2019-08-12 NOTE — ED Notes (Signed)
Pt given lunch tray.

## 2019-08-13 DIAGNOSIS — F203 Undifferentiated schizophrenia: Secondary | ICD-10-CM | POA: Diagnosis not present

## 2019-08-13 MED ORDER — DIPHENHYDRAMINE HCL 25 MG PO CAPS
25.0000 mg | ORAL_CAPSULE | Freq: Once | ORAL | Status: DC
  Filled 2019-08-13: qty 1

## 2019-08-13 MED ORDER — CLONAZEPAM 0.5 MG PO TABS
0.5000 mg | ORAL_TABLET | Freq: Once | ORAL | Status: AC
  Administered 2019-08-13: 21:00:00 0.5 mg via ORAL
  Filled 2019-08-13: qty 1

## 2019-08-13 MED ORDER — HYDROXYZINE HCL 25 MG PO TABS
25.0000 mg | ORAL_TABLET | Freq: Once | ORAL | Status: AC
  Administered 2019-08-13: 16:00:00 25 mg via ORAL
  Filled 2019-08-13: qty 1

## 2019-08-13 NOTE — ED Notes (Signed)
Meal tray provided.

## 2019-08-13 NOTE — ED Notes (Signed)
Pt taking a shower 

## 2019-08-13 NOTE — ED Notes (Signed)
Hourly rounding reveals patient sleeping in room. No complaints, stable, in no acute distress. Q15 minute rounds and monitoring via Security Cameras to continue. 

## 2019-08-13 NOTE — ED Notes (Signed)
Hourly rounding reveals patient in day room. No complaints, stable, in no acute distress. Q15 minute rounds and monitoring via Security Cameras to continue. Snack and beverage given. 

## 2019-08-13 NOTE — ED Notes (Signed)
Report to include Situation, Background, Assessment, and Recommendations received from amy B. RN. Patient alert and oriented, warm and dry, in no acute distress. Patient denies SI, HI, AVH and pain. Patient made aware of Q15 minute rounds and security cameras for their safety. Patient instructed to come to me with needs or concerns.

## 2019-08-13 NOTE — ED Provider Notes (Signed)
-----------------------------------------   6:02 AM on 08/13/2019 -----------------------------------------   Blood pressure (!) 136/99, pulse 74, temperature 97.8 F (36.6 C), temperature source Oral, resp. rate 18, height 5\' 3"  (1.6 m), weight 94.8 kg, last menstrual period 08/06/2019, SpO2 99 %.  The patient is sleeping at this time.  There have been no acute events since the last update.  Awaiting disposition plan from Behavioral Medicine and/or Social Work team(s).   Paulette Blanch, MD 08/13/19 [phone]

## 2019-08-13 NOTE — ED Notes (Signed)
Hourly rounding reveals patient in room. No complaints, stable, in no acute distress. Q15 minute rounds and monitoring via Security Cameras to continue. 

## 2019-08-13 NOTE — ED Notes (Signed)
Pt given PRN anxiety medication per patient request. Maintained on 15 minute checks and observation by security camera for safety.

## 2019-08-13 NOTE — ED Notes (Signed)
IVC/ACCEPTED TO [facility] AND [facility]ENDING [facility] [employer] FOR TRANSPORTATION.

## 2019-08-13 NOTE — BH Assessment (Signed)
TTS spoke with Intake Clinician at [facility] ([place]: [phone]) who reports pt's bed is being held for her since pt is unable to be transported to accepting facility today.  Patient to be transported as soon as transport is available.

## 2019-08-13 NOTE — ED Notes (Signed)
IVC No transport avialable today 11/1

## 2019-08-14 DIAGNOSIS — F203 Undifferentiated schizophrenia: Secondary | ICD-10-CM | POA: Diagnosis not present

## 2019-08-14 MED ORDER — ALUM & MAG HYDROXIDE-SIMETH 200-200-20 MG/5ML PO SUSP
30.0000 mL | Freq: Once | ORAL | Status: AC
  Administered 2019-08-14: 13:00:00 30 mL via ORAL
  Filled 2019-08-14: qty 30

## 2019-08-14 MED ORDER — CLONAZEPAM 0.5 MG PO TABS
0.5000 mg | ORAL_TABLET | Freq: Once | ORAL | Status: AC
  Administered 2019-08-14: 15:00:00 0.5 mg via ORAL
  Filled 2019-08-14: qty 1

## 2019-08-14 NOTE — ED Notes (Signed)
Hourly rounding reveals patient sleeping in room. No complaints, stable, in no acute distress. Q15 minute rounds and monitoring via Security Cameras to continue. 

## 2019-08-14 NOTE — ED Notes (Signed)
Hourly rounding reveals patient in rest room. No complaints, stable, in no acute distress. Q15 minute rounds and monitoring via Security Cameras to continue. 

## 2019-08-14 NOTE — ED Notes (Signed)
Patient transferred to [facility], patient received discharge papers. Patient received belongings and verbalized she has received all of her belongings. Patient appropriate and cooperative, Denies SI/HI AVH. Vital signs taken. NAD noted.

## 2019-08-22 ENCOUNTER — Telehealth: Payer: Self-pay

## 2019-08-22 NOTE — Telephone Encounter (Signed)
TC from [place] worker.  Needs to bring patient in Thursday for PPDR. PPD placed at [facility] on 08/21/19. Appt scheduled for 08/24/19 Aileen Fass, RN

## 2019-08-24 ENCOUNTER — Encounter: Payer: Self-pay | Admitting: Gastroenterology

## 2019-08-24 ENCOUNTER — Ambulatory Visit (LOCAL_COMMUNITY_HEALTH_CENTER): Payer: Medicare Other

## 2019-08-24 ENCOUNTER — Other Ambulatory Visit: Payer: Self-pay

## 2019-08-24 ENCOUNTER — Ambulatory Visit (INDEPENDENT_AMBULATORY_CARE_PROVIDER_SITE_OTHER): Payer: Medicare Other | Admitting: Gastroenterology

## 2019-08-24 VITALS — BP 119/87 | HR 109 | Temp 98.7°F | Resp 18 | Ht 63.0 in | Wt 217.8 lb

## 2019-08-24 DIAGNOSIS — R109 Unspecified abdominal pain: Secondary | ICD-10-CM | POA: Diagnosis not present

## 2019-08-24 DIAGNOSIS — R197 Diarrhea, unspecified: Secondary | ICD-10-CM | POA: Diagnosis not present

## 2019-08-24 DIAGNOSIS — Z111 Encounter for screening for respiratory tuberculosis: Secondary | ICD-10-CM

## 2019-08-24 MED ORDER — DICYCLOMINE HCL 10 MG PO CAPS: 10.0000 mg | ORAL_CAPSULE | Freq: Three times a day (TID) | ORAL | 0 refills | Status: DC

## 2019-08-24 NOTE — Progress Notes (Signed)
Arlyss Repress, MD [address]  [address]  [place], [place] [address]  Main: [phone]  Fax: [phone]    Gastroenterology Consultation  Referring Provider:     Evie Lacks, NP Primary Care Physician:  Evie Lacks, NP Primary Gastroenterologist:  Dr. Arlyss Repress Reason for Consultation:     Abdominal pain, nausea vomiting and diarrhea, abnormal CT        HPI:   Kimberly Boyle is a 23 y.o. female referred by Dr. Evie Lacks, NP  for consultation & management of constellation of GI symptoms including generalized abdominal cramps, diarrhea, nausea and vomiting for which she presented to ER on 07/10/2019.  CT revealed equivocal colonic wall thickening of the transverse colon without pericolonic edema and possible colitis.  She had elevated white count, 15.1K, mild thrombocytosis, normal hemoglobin.  She was discharged on Bactrim and Flagyl and referred to GI by the [facility] ER for further evaluation and management.  Patient reports that she has been experiencing above symptoms ever since she was treated for pelvic inflammatory disease. Patient lives in a group home, history of anxiety, depression and bipolar She drinks sodas daily, she has gained significant amount of weight in last few months, she says she eats a lot daily.  Her mom passed away last year, she was living with her stepdad before moving to a group home. She does smoke cigarettes up to 2 packs a day, does not drink alcohol  NSAIDs: None  Antiplts/Anticoagulants/Anti thrombotics: None  GI Procedures: None No family history of inflammatory bowel disease, colon cancer  Past Medical History:  Diagnosis Date   Anxiety    Arthritis    Bipolar disorder (HCC)    Depression    Hidradenitis suppurativa 06/22/2019   Groin    Major depressive disorder    Panic disorder    PID (acute pelvic inflammatory disease) 06/22/2019   06/22/19    Poor historian 06/22/2019   Schizophrenia (HCC)      No past surgical history on file.  Current Outpatient Medications:    benztropine (COGENTIN) 1 MG tablet, Take 1 mg by mouth at bedtime., Disp: , Rfl:    cloZAPine (CLOZARIL) 100 MG tablet, Take 400 mg by mouth at bedtime., Disp: , Rfl: 5   fenofibrate (TRICOR) 145 MG tablet, Take 145 mg by mouth daily., Disp: , Rfl:    lithium 300 MG tablet, Take 750 mg by mouth 2 (two) times daily., Disp: , Rfl: 4   paliperidone (INVEGA SUSTENNA) 234 MG/1.5ML SUSY injection, Inject 234 mg into the muscle every 28 (twenty-eight) days. Next dose on 07/06/2018 (Patient taking differently: Inject 234 mg into the muscle every 28 (twenty-eight) days. ), Disp: 1.8 mL, Rfl: 1   clonazePAM (KLONOPIN) 0.5 MG tablet, Take 1 tablet (0.5 mg total) by mouth 2 (two) times daily as needed (anxiety). (Patient not taking: Reported on 08/24/2019), Disp: 60 tablet, Rfl: 0   dicyclomine (BENTYL) 10 MG capsule, Take 1 capsule (10 mg total) by mouth 4 (four) times daily -  before meals and at bedtime., Disp: 120 capsule, Rfl: 0    Family History  Problem Relation Age of Onset   Post-traumatic stress disorder Mother    Depression Mother    Depression Maternal Uncle    Cancer Maternal Grandmother      Social History   Tobacco Use   Smoking status: Current Every Day Smoker    Packs/day: 0.50    Years: 11.00    Pack years: 5.50  Types: Cigarettes   Smokeless tobacco: Never Used  Substance Use Topics   Alcohol use: No    Frequency: Never   Drug use: No    Types: Marijuana    Allergies as of 08/24/2019 - Review Complete 08/24/2019  Allergen Reaction Noted   Penicillins Diarrhea and Nausea Only 01/04/2017    Review of Systems:    All systems reviewed and negative except where noted in HPI.   Physical Exam:  BP 119/87 (BP Location: Left Arm, Patient Position: Sitting, Cuff Size: Large)    Pulse (!) 109    Temp 98.7 F (37.1 C)    Resp 18    Ht 5\' 3"  (1.6 m)    Wt 217 lb 12.8 oz (98.8 kg)     LMP 08/06/2019 (Approximate)    BMI 38.58 kg/m  Patient's last menstrual period was 08/06/2019 (approximate).  General:   Alert,  Well-developed, well-nourished, pleasant and cooperative in NAD Head:  Normocephalic and atraumatic. Eyes:  Sclera clear, no icterus.   Conjunctiva pink. Ears:  Normal auditory acuity. Nose:  No deformity, discharge, or lesions. Mouth:  No deformity or lesions,oropharynx pink & moist. Neck:  Supple; no masses or thyromegaly. Lungs:  Respirations even and unlabored.  Clear throughout to auscultation.   No wheezes, crackles, or rhonchi. No acute distress. Heart:  Regular rate and rhythm; no murmurs, clicks, rubs, or gallops. Abdomen:  Normal bowel sounds. Soft, non-tender and non-distended without masses, hepatosplenomegaly or hernias noted.  No guarding or rebound tenderness.   Rectal: Not performed Msk:  Symmetrical without gross deformities. Good, equal movement & strength bilaterally. Pulses:  Normal pulses noted. Extremities:  No clubbing or edema.  No cyanosis. Neurologic:  Alert and oriented x3;  grossly normal neurologically. Skin:  Intact without significant lesions or rashes. No jaundice. Psych:  Alert and cooperative. Normal mood and affect.  Imaging Studies: Reviewed  Assessment and Plan:   Hyla Coard is a 23 y.o. female with history of bipolar, anxiety and depression seen in consultation of generalized abdominal cramps associated with nausea, vomiting and nonbloody diarrhea with leukocytosis and thrombocytosis, CT revealed possible colitis in the transverse colon Recommend stool studies to rule out infection.  If negative, recommend upper endoscopy and colonoscopy with biopsies Advised her to avoid carbonated beverages, fatty foods Will try Bentyl in the interim  Follow up in 2 months   Cephas Darby, MD

## 2019-08-24 NOTE — Progress Notes (Signed)
Presented to [facility] for TB skin test reading as test was administered 08/20/1009 (refer to recent phone note by Aileen Fass RN, TB Coordinator). Per client, administered in right lower anterior forearm and administration site with faintly circled area which is slighty ecchymotic (greenish yellow). No induration on left anterior forearm. Accompanied during visit by Manon Hilding, caregiver. Rich Number, RN

## 2019-08-31 LAB — GI PROFILE, STOOL, PCR

## 2019-09-01 ENCOUNTER — Other Ambulatory Visit: Payer: Self-pay

## 2019-09-01 DIAGNOSIS — R9389 Abnormal findings on diagnostic imaging of other specified body structures: Secondary | ICD-10-CM

## 2019-09-01 DIAGNOSIS — R197 Diarrhea, unspecified: Secondary | ICD-10-CM

## 2019-09-12 ENCOUNTER — Telehealth: Payer: Self-pay

## 2019-09-12 NOTE — Telephone Encounter (Signed)
Called the group home and informed them that Dr. Marius Ditch is out of the office on 09/21/2019. They are okay to move to 09/18/2019. Patient will go for COVID test on 09/14/2019

## 2019-09-14 ENCOUNTER — Telehealth: Payer: Self-pay | Admitting: Gastroenterology

## 2019-09-14 ENCOUNTER — Other Ambulatory Visit
Admission: RE | Admit: 2019-09-14 | Discharge: 2019-09-14 | Disposition: A | Discharge status: Home / Self Care | Payer: Medicare Other | Source: Ambulatory Visit | Attending: Gastroenterology | Admitting: Gastroenterology

## 2019-09-14 ENCOUNTER — Other Ambulatory Visit: Payer: Self-pay

## 2019-09-14 DIAGNOSIS — Z01812 Encounter for preprocedural laboratory examination: Secondary | ICD-10-CM | POA: Diagnosis present

## 2019-09-14 DIAGNOSIS — Z20828 Contact with and (suspected) exposure to other viral communicable diseases: Secondary | ICD-10-CM | POA: Diagnosis not present

## 2019-09-14 LAB — SARS CORONAVIRUS 2 (TAT 6-24 HRS): SARS Coronavirus 2: NEGATIVE

## 2019-09-14 NOTE — Telephone Encounter (Signed)
Called Trish and she states the guardianship needed to be there. Callled Risha back and she states they can call and give verbal consent but they can not be there. Gave her Wannetta Sender number so she could talk to her about it.

## 2019-09-14 NOTE — Telephone Encounter (Signed)
Kimberly Boyle  From [place] social services left vm regarding pt procedure for 09/18/19 she states pt is under guardianship and  They will have to signe consent form for her to have this procedure done please call her at [phone]

## 2019-09-18 ENCOUNTER — Other Ambulatory Visit: Payer: Self-pay

## 2019-09-18 ENCOUNTER — Encounter: Admission: RE | Payer: Self-pay | Source: Home / Self Care

## 2019-09-18 ENCOUNTER — Telehealth: Payer: Self-pay

## 2019-09-18 ENCOUNTER — Ambulatory Visit: Admission: RE | Admit: 2019-09-18 | Payer: Medicare Other | Source: Home / Self Care | Admitting: Gastroenterology

## 2019-09-18 DIAGNOSIS — R9389 Abnormal findings on diagnostic imaging of other specified body structures: Secondary | ICD-10-CM

## 2019-09-18 DIAGNOSIS — R197 Diarrhea, unspecified: Secondary | ICD-10-CM

## 2019-09-18 SURGERY — ESOPHAGOGASTRODUODENOSCOPY (EGD) WITH PROPOFOL
Anesthesia: General

## 2019-09-18 MED ORDER — PEG 3350-KCL-NABCB-NACL-NASULF 236 G PO SOLR: 4000.0000 mL | Freq: Once | ORAL | 0 refills | Status: AC

## 2019-09-18 NOTE — Telephone Encounter (Signed)
Caregiver called to inform us that patient insurance did not cover the prep so she did not prep for her colonoscopy. Sent new prep to the pharmacy and scheduled patient for 09/27/2019

## 2019-09-26 ENCOUNTER — Other Ambulatory Visit
Admission: RE | Admit: 2019-09-26 | Discharge: 2019-09-26 | Disposition: A | Discharge status: Home / Self Care | Payer: Medicare Other | Source: Ambulatory Visit | Attending: Gastroenterology | Admitting: Gastroenterology

## 2019-09-26 DIAGNOSIS — Z20828 Contact with and (suspected) exposure to other viral communicable diseases: Secondary | ICD-10-CM | POA: Insufficient documentation

## 2019-09-26 DIAGNOSIS — Z01812 Encounter for preprocedural laboratory examination: Secondary | ICD-10-CM | POA: Insufficient documentation

## 2019-09-26 LAB — SARS CORONAVIRUS 2 (TAT 6-24 HRS): SARS Coronavirus 2: NEGATIVE

## 2019-09-29 ENCOUNTER — Ambulatory Visit: Payer: Medicare Other | Admitting: Anesthesiology

## 2019-09-29 ENCOUNTER — Ambulatory Visit
Admission: RE | Admit: 2019-09-29 | Discharge: 2019-09-29 | Disposition: A | Discharge status: Home / Self Care | Payer: Medicare Other | Attending: Gastroenterology | Admitting: Gastroenterology

## 2019-09-29 ENCOUNTER — Encounter
Admission: RE | Disposition: A | Discharge status: Home / Self Care | Payer: Self-pay | Source: Home / Self Care | Attending: Gastroenterology

## 2019-09-29 ENCOUNTER — Encounter: Payer: Self-pay | Admitting: Gastroenterology

## 2019-09-29 DIAGNOSIS — M1990 Unspecified osteoarthritis, unspecified site: Secondary | ICD-10-CM | POA: Insufficient documentation

## 2019-09-29 DIAGNOSIS — F410 Panic disorder [episodic paroxysmal anxiety] without agoraphobia: Secondary | ICD-10-CM | POA: Insufficient documentation

## 2019-09-29 DIAGNOSIS — R1084 Generalized abdominal pain: Secondary | ICD-10-CM | POA: Diagnosis not present

## 2019-09-29 DIAGNOSIS — R933 Abnormal findings on diagnostic imaging of other parts of digestive tract: Secondary | ICD-10-CM

## 2019-09-29 DIAGNOSIS — F319 Bipolar disorder, unspecified: Secondary | ICD-10-CM | POA: Insufficient documentation

## 2019-09-29 DIAGNOSIS — D72829 Elevated white blood cell count, unspecified: Secondary | ICD-10-CM | POA: Diagnosis not present

## 2019-09-29 DIAGNOSIS — K30 Functional dyspepsia: Secondary | ICD-10-CM | POA: Diagnosis not present

## 2019-09-29 DIAGNOSIS — F17210 Nicotine dependence, cigarettes, uncomplicated: Secondary | ICD-10-CM | POA: Insufficient documentation

## 2019-09-29 DIAGNOSIS — K2100 Gastro-esophageal reflux disease with esophagitis, without bleeding: Secondary | ICD-10-CM | POA: Insufficient documentation

## 2019-09-29 DIAGNOSIS — K529 Noninfective gastroenteritis and colitis, unspecified: Secondary | ICD-10-CM | POA: Diagnosis not present

## 2019-09-29 DIAGNOSIS — R197 Diarrhea, unspecified: Secondary | ICD-10-CM | POA: Diagnosis present

## 2019-09-29 DIAGNOSIS — F209 Schizophrenia, unspecified: Secondary | ICD-10-CM | POA: Diagnosis not present

## 2019-09-29 DIAGNOSIS — Z79899 Other long term (current) drug therapy: Secondary | ICD-10-CM | POA: Insufficient documentation

## 2019-09-29 DIAGNOSIS — F419 Anxiety disorder, unspecified: Secondary | ICD-10-CM | POA: Insufficient documentation

## 2019-09-29 DIAGNOSIS — R1013 Epigastric pain: Secondary | ICD-10-CM

## 2019-09-29 DIAGNOSIS — F17200 Nicotine dependence, unspecified, uncomplicated: Secondary | ICD-10-CM | POA: Insufficient documentation

## 2019-09-29 DIAGNOSIS — L732 Hidradenitis suppurativa: Secondary | ICD-10-CM | POA: Insufficient documentation

## 2019-09-29 DIAGNOSIS — Z880 Allergy status to penicillin: Secondary | ICD-10-CM | POA: Diagnosis not present

## 2019-09-29 HISTORY — PX: ESOPHAGOGASTRODUODENOSCOPY (EGD) WITH PROPOFOL: SHX5813

## 2019-09-29 HISTORY — PX: COLONOSCOPY WITH PROPOFOL: SHX5780

## 2019-09-29 LAB — POCT PREGNANCY, URINE: Preg Test, Ur: NEGATIVE

## 2019-09-29 SURGERY — COLONOSCOPY WITH PROPOFOL
Anesthesia: General

## 2019-09-29 MED ORDER — PROPOFOL 500 MG/50ML IV EMUL
INTRAVENOUS | Status: DC | PRN
  Administered 2019-09-29: 175 ug/kg/min via INTRAVENOUS

## 2019-09-29 MED ORDER — OMEPRAZOLE 40 MG PO CPDR: 40.0000 mg | DELAYED_RELEASE_CAPSULE | Freq: Two times a day (BID) | ORAL | 2 refills | Status: DC

## 2019-09-29 MED ORDER — PROPOFOL 500 MG/50ML IV EMUL
INTRAVENOUS | Status: AC
  Filled 2019-09-29: qty 50

## 2019-09-29 MED ORDER — SODIUM CHLORIDE 0.9 % IV SOLN: INTRAVENOUS | Status: DC | PRN

## 2019-09-29 MED ORDER — PROPOFOL 10 MG/ML IV BOLUS
INTRAVENOUS | Status: DC | PRN
  Administered 2019-09-29: 60 mg via INTRAVENOUS

## 2019-09-29 MED ORDER — LIDOCAINE HCL (CARDIAC) PF 100 MG/5ML IV SOSY
PREFILLED_SYRINGE | INTRAVENOUS | Status: DC | PRN
  Administered 2019-09-29: 50 mg via INTRAVENOUS

## 2019-09-29 NOTE — Op Note (Signed)
[facility] Gastroenterology Patient Name: Kimberly Boyle Procedure Date: 09/29/2019 11:38 AM MRN: [id number] Account #: [id number] Date of Birth: 11-09-1995 Admit Type: Outpatient Age: 23 Room: [facility] ENDO ROOM 2 Gender: Female Note Status: Finalized Procedure:             Upper GI endoscopy Indications:           Dyspepsia Providers:             Lin Landsman MD, MD Medicines:             Monitored Anesthesia Care Complications:         No immediate complications. Estimated blood loss: None. Procedure:             Pre-Anesthesia Assessment:                        - Prior to the procedure, a History and Physical was                         performed, and patient medications and allergies were                         reviewed. The patient is competent. The risks and                         benefits of the procedure and the sedation options and                         risks were discussed with the patient. All questions                         were answered and informed consent was obtained.                         Patient identification and proposed procedure were                         verified by the physician, the nurse, the                         anesthesiologist, the anesthetist and the technician                         in the pre-procedure area in the procedure room in the                         endoscopy suite. Mental Status Examination: alert and                         oriented. Airway Examination: normal oropharyngeal                         airway and neck mobility. Respiratory Examination:                         clear to auscultation. CV Examination: normal.                         Prophylactic Antibiotics: The patient does not require  prophylactic antibiotics. Prior Anticoagulants: The                         patient has taken no previous anticoagulant or                         antiplatelet agents. ASA Grade Assessment: II  - A                         patient with mild systemic disease. After reviewing                         the risks and benefits, the patient was deemed in                         satisfactory condition to undergo the procedure. The                         anesthesia plan was to use monitored anesthesia care                         (MAC). Immediately prior to administration of                         medications, the patient was re-assessed for adequacy                         to receive sedatives. The heart rate, respiratory                         rate, oxygen saturations, blood pressure, adequacy of                         pulmonary ventilation, and response to care were                         monitored throughout the procedure. The physical                         status of the patient was re-assessed after the                         procedure.                        After obtaining informed consent, the endoscope was                         passed under direct vision. Throughout the procedure,                         the patient's blood pressure, pulse, and oxygen                         saturations were monitored continuously. The Endoscope                         was introduced through the mouth, and advanced to the  second part of duodenum. The upper GI endoscopy was                         accomplished without difficulty. The patient tolerated                         the procedure well. Findings:      The duodenal bulb and second portion of the duodenum were normal.       Biopsies for histology were taken with a cold forceps for evaluation of       celiac disease.      The entire examined stomach was normal. Biopsies were taken with a cold       forceps for Helicobacter pylori testing.      The cardia and gastric fundus were normal on retroflexion.      LA Grade A (one or more mucosal breaks less than 5 mm, not extending       between tops of 2 mucosal  folds) esophagitis with no bleeding was found       in the lower third of the esophagus. Impression:            - Normal duodenal bulb and second portion of the                         duodenum. Biopsied.                        - Normal stomach. Biopsied.                        - LA Grade A reflux esophagitis with no bleeding. Recommendation:        - Follow an antireflux regimen.                        - Use a proton pump inhibitor PO BID for 8 weeks.                        - Return to my office as previously scheduled.                        - Await pathology results. Procedure Code(s):     --- Professional ---                        [phone], Esophagogastroduodenoscopy, flexible,                         transoral; with biopsy, single or multiple Diagnosis Code(s):     --- Professional ---                        K21.00, Gastro-esophageal reflux disease with                         esophagitis, without bleeding                        R10.13, Epigastric pain CPT copyright 2019 American Medical Association. All rights reserved. The codes documented in this report are preliminary and upon coder review may  be revised to meet current compliance requirements. Dr. Rod Can  Lucill Mauck Raeanne Gathers MD, MD 09/29/2019 12:07:01 PM This report has been signed electronically. Number of Addenda: 0 Note Initiated On: 09/29/2019 11:38 AM Estimated Blood Loss:  Estimated blood loss: none.      [facility]

## 2019-09-29 NOTE — Anesthesia Preprocedure Evaluation (Addendum)
Anesthesia Evaluation  Patient identified by MRN, date of birth, ID band Patient awake    Reviewed: Allergy & Precautions, H&P , NPO status , Patient's Chart, lab work & pertinent test results, reviewed documented beta blocker date and time   History of Anesthesia Complications Negative for: history of anesthetic complications  Airway Mallampati: II  TM Distance: >3 FB Neck ROM: full    Dental  (+) Dental Advidsory Given   Pulmonary neg shortness of breath, asthma , neg COPD, neg recent URI, Current Smoker,    Pulmonary exam normal        Cardiovascular Exercise Tolerance: Good negative cardio ROS Normal cardiovascular exam     Neuro/Psych PSYCHIATRIC DISORDERS Anxiety Depression Bipolar Disorder Schizophrenia negative neurological ROS     GI/Hepatic negative GI ROS, Neg liver ROS,   Endo/Other  negative endocrine ROS  Renal/GU negative Renal ROS  negative genitourinary   Musculoskeletal   Abdominal   Peds  Hematology negative hematology ROS (+)   Anesthesia Other Findings Past Medical History: No date: Anxiety No date: Arthritis No date: Bipolar disorder ([place]) No date: Depression 06/22/2019: Hidradenitis suppurativa     Comment:  Groin  No date: Major depressive disorder No date: Panic disorder 06/22/2019: PID (acute pelvic inflammatory disease)     Comment:  06/22/19  06/22/2019: Poor historian No date: Schizophrenia ([place])   Reproductive/Obstetrics negative OB ROS                            Anesthesia Physical Anesthesia Plan  ASA: II  Anesthesia Plan: General   Post-op Pain Management:    Induction: Intravenous  PONV Risk Score and Plan: 2 and Propofol infusion and TIVA  Airway Management Planned: Natural Airway and Nasal Cannula  Additional Equipment:   Intra-op Plan:   Post-operative Plan:   Informed Consent: I have reviewed the patients History and Physical,  chart, labs and discussed the procedure including the risks, benefits and alternatives for the proposed anesthesia with the patient or authorized representative who has indicated his/her understanding and acceptance.     Dental Advisory Given  Plan Discussed with: Anesthesiologist, CRNA and Surgeon  Anesthesia Plan Comments:         Anesthesia Quick Evaluation

## 2019-09-29 NOTE — Anesthesia Procedure Notes (Signed)
Date/Time: 09/29/2019 11:53 AM Performed by: Johnna Acosta, CRNA Pre-anesthesia Checklist: Patient identified, Emergency Drugs available, Suction available, Patient being monitored and Timeout performed Patient Re-evaluated:Patient Re-evaluated prior to induction Oxygen Delivery Method: Nasal cannula Preoxygenation: Pre-oxygenation with 100% oxygen Induction Type: IV induction

## 2019-09-29 NOTE — Op Note (Signed)
[facility] Gastroenterology Patient Name: Kimberly Boyle Procedure Date: 09/29/2019 9:29 AM MRN: [id number] Account #: [id number] Date of Birth: 1996-04-27 Admit Type: Outpatient Age: 23 Room: [facility] ENDO ROOM 2 Gender: Female Note Status: Finalized Procedure:             Colonoscopy Indications:           Generalized abdominal pain, Clinically significant                         diarrhea of unexplained origin, Abnormal CT of the GI                         tract Providers:             Lin Landsman MD, MD Medicines:             Monitored Anesthesia Care Complications:         No immediate complications. Estimated blood loss: None. Procedure:             Pre-Anesthesia Assessment:                        - Prior to the procedure, a History and Physical was                         performed, and patient medications and allergies were                         reviewed. The patient is competent. The risks and                         benefits of the procedure and the sedation options and                         risks were discussed with the patient. All questions                         were answered and informed consent was obtained.                         Patient identification and proposed procedure were                         verified by the physician, the nurse, the                         anesthesiologist, the anesthetist and the technician                         in the pre-procedure area in the procedure room in the                         endoscopy suite. Mental Status Examination: alert and                         oriented. Airway Examination: normal oropharyngeal                         airway and neck mobility. Respiratory Examination:  clear to auscultation. CV Examination: normal.                         Prophylactic Antibiotics: The patient does not require                         prophylactic antibiotics. Prior Anticoagulants: The                          patient has taken no previous anticoagulant or                         antiplatelet agents. ASA Grade Assessment: II - A                         patient with mild systemic disease. After reviewing                         the risks and benefits, the patient was deemed in                         satisfactory condition to undergo the procedure. The                         anesthesia plan was to use monitored anesthesia care                         (MAC). Immediately prior to administration of                         medications, the patient was re-assessed for adequacy                         to receive sedatives. The heart rate, respiratory                         rate, oxygen saturations, blood pressure, adequacy of                         pulmonary ventilation, and response to care were                         monitored throughout the procedure. The physical                         status of the patient was re-assessed after the                         procedure.                        After obtaining informed consent, the colonoscope was                         passed under direct vision. Throughout the procedure,                         the patient's blood pressure, pulse, and oxygen  saturations were monitored continuously. The                         Colonoscope was introduced through the anus and                         advanced to the the terminal ileum, with                         identification of the appendiceal orifice and IC                         valve. The colonoscopy was performed without                         difficulty. The patient tolerated the procedure well.                         The quality of the bowel preparation was poor. Findings:      The perianal and digital rectal examinations were normal. Pertinent       negatives include normal sphincter tone and no palpable rectal lesions.      The terminal ileum appeared  normal.      The entire examined colon appeared normal.      The retroflexed view of the distal rectum and anal verge was normal and       showed no anal or rectal abnormalities.      Copious quantities of semi-liquid stool was found in the entire colon. Impression:            - Preparation of the colon was poor.                        - The examined portion of the ileum was normal.                        - The entire examined colon is normal.                        - The distal rectum and anal verge are normal on                         retroflexion view.                        - Stool in the entire examined colon.                        - No specimens collected. Recommendation:        - Discharge patient to a nursing home (with escort).                        - High fiber diet today.                        - Continue present medications.                        - Return to my office as previously scheduled. Procedure Code(s):     --- Professional ---  45378, Colonoscopy, flexible; diagnostic, including                         collection of specimen(s) by brushing or washing, when                         performed (separate procedure) Diagnosis Code(s):     --- Professional ---                        R10.84, Generalized abdominal pain                        R93.3, Abnormal findings on diagnostic imaging of                         other parts of digestive tract                        R19.7, Diarrhea, unspecified CPT copyright 2019 American Medical Association. All rights reserved. The codes documented in this report are preliminary and upon coder review may  be revised to meet current compliance requirements. Dr. Ulyess Mort Lin Landsman MD, MD 09/29/2019 12:20:57 PM This report has been signed electronically. Number of Addenda: 0 Note Initiated On: 09/29/2019 9:29 AM Scope Withdrawal Time: 0 hours 5 minutes 4 seconds  Total Procedure Duration: 0 hours 7  minutes 47 seconds  Estimated Blood Loss:  Estimated blood loss: none.      [facility]

## 2019-09-29 NOTE — Anesthesia Post-op Follow-up Note (Signed)
Anesthesia QCDR form completed.        

## 2019-09-29 NOTE — Anesthesia Postprocedure Evaluation (Signed)
Anesthesia Post Note  Patient: Kimberly Boyle  Procedure(s) Performed: COLONOSCOPY WITH PROPOFOL (N/A ) ESOPHAGOGASTRODUODENOSCOPY (EGD) WITH PROPOFOL (N/A )  Patient location during evaluation: PACU Anesthesia Type: General Level of consciousness: awake and alert Pain management: pain level controlled Vital Signs Assessment: post-procedure vital signs reviewed and stable Respiratory status: spontaneous breathing, nonlabored ventilation and respiratory function stable Cardiovascular status: blood pressure returned to baseline and stable Postop Assessment: no apparent nausea or vomiting Anesthetic complications: no     Last Vitals:  Vitals:   09/29/19 1240 09/29/19 1245  BP:  (!) 142/107  Pulse: 66 67  Resp: 17 18  Temp:    SpO2: 100% 100%    Last Pain:  Vitals:   09/29/19 1222  TempSrc: Temporal  PainSc:                  Tera Mater

## 2019-09-29 NOTE — Transfer of Care (Signed)
Immediate Anesthesia Transfer of Care Note  Patient: Kimberly Boyle  Procedure(s) Performed: COLONOSCOPY WITH PROPOFOL (N/A ) ESOPHAGOGASTRODUODENOSCOPY (EGD) WITH PROPOFOL (N/A )  Patient Location: PACU  Anesthesia Type:General  Level of Consciousness: sedated  Airway & Oxygen Therapy: Patient Spontanous Breathing and Patient connected to nasal cannula oxygen  Post-op Assessment: Report given to RN and Post -op Vital signs reviewed and stable  Post vital signs: Reviewed and stable  Last Vitals:  Vitals Value Taken Time  BP 117/74 09/29/19 1222  Temp    Pulse 77 09/29/19 1222  Resp 18 09/29/19 1222  SpO2 96 % 09/29/19 1222  Vitals shown include unvalidated device data.  Last Pain:  Vitals:   09/29/19 0949  TempSrc: Temporal  PainSc: 0-No pain         Complications: No apparent anesthesia complications

## 2019-09-29 NOTE — H&P (Signed)
Arlyss Repress, MD [address]  [address]  [place], [place] [address]  Main: [phone]  Fax: [phone] Pager: [phone]  Primary Care Physician:  Evie Lacks, NP Primary Gastroenterologist:  Dr. Arlyss Repress  Pre-Procedure History & Physical: HPI:  Kimberly Boyle is a 23 y.o. female is here for an endoscopy and colonoscopy.   Past Medical History:  Diagnosis Date  . Anxiety   . Arthritis   . Bipolar disorder (HCC)   . Depression   . Hidradenitis suppurativa 06/22/2019   Groin   . Major depressive disorder   . Panic disorder   . PID (acute pelvic inflammatory disease) 06/22/2019   06/22/19   . Poor historian 06/22/2019  . Schizophrenia (HCC)     History reviewed. No pertinent surgical history.  Prior to Admission medications   Medication Sig Start Date End Date Taking? Authorizing Provider  benztropine (COGENTIN) 1 MG tablet Take 1 mg by mouth at bedtime. 06/09/19  Yes [provider]  cloZAPine (CLOZARIL) 100 MG tablet Take 400 mg by mouth at bedtime. 07/15/18  Yes [provider]  fenofibrate (TRICOR) 145 MG tablet Take 145 mg by mouth daily. 08/22/19  Yes [provider]  lithium 300 MG tablet Take 750 mg by mouth 2 (two) times daily. 07/01/18  Yes [provider]  paliperidone (INVEGA SUSTENNA) 234 MG/1.5ML SUSY injection Inject 234 mg into the muscle every 28 (twenty-eight) days. Next dose on 07/06/2018 Patient taking differently: Inject 234 mg into the muscle every 28 (twenty-eight) days.  07/06/18  Yes Pucilowska, [name], MD  clonazePAM (KLONOPIN) 0.5 MG tablet Take 1 tablet (0.5 mg total) by mouth 2 (two) times daily as needed (anxiety). Patient not taking: Reported on 08/24/2019 05/31/18   Pucilowska, Ellin Goodie, MD  dicyclomine (BENTYL) 10 MG capsule Take 1 capsule (10 mg total) by mouth 4 (four) times daily -  before meals and at bedtime. 08/24/19 09/23/19  Toney Reil, MD    Allergies as of  09/18/2019 - Review Complete 08/24/2019  Allergen Reaction Noted  . Penicillins Diarrhea and Nausea Only 01/04/2017    Family History  Problem Relation Age of Onset  . Post-traumatic stress disorder Mother   . Depression Mother   . Depression Maternal Uncle   . Cancer Maternal Grandmother     Social History   Socioeconomic History  . Marital status: Single    Spouse name: Not on file  . Number of children: 0  . Years of education: Not on file  . Highest education level: High school graduate  Occupational History    Comment: unemployed  Tobacco Use  . Smoking status: Current Every Day Smoker    Packs/day: 0.50    Years: 11.00    Pack years: 5.50    Types: Cigarettes  . Smokeless tobacco: Never Used  Substance and Sexual Activity  . Alcohol use: No  . Drug use: No    Types: Marijuana    Comment: none in 2 months  . Sexual activity: Not Currently  Other Topics Concern  . Not on file  Social History Narrative   ** Merged History Encounter **       Social Determinants of Health   Financial Resource Strain:   . Difficulty of Paying Living Expenses: Not on file  Food Insecurity:   . Worried About Programme researcher, broadcasting/film/video in the Last Year: Not on file  . Ran Out of Food in the Last Year: Not on file  Transportation Needs:   . Film/video editor (Medical): Not on file  . Lack of Transportation (Non-Medical): Not on file  Physical Activity:   . Days of Exercise per Week: Not on file  . Minutes of Exercise per Session: Not on file  Stress:   . Feeling of Stress : Not on file  Social Connections:   . Frequency of Communication with Friends and Family: Not on file  . Frequency of Social Gatherings with Friends and Family: Not on file  . Attends Religious Services: Not on file  . Active Member of Clubs or Organizations: Not on file  . Attends Archivist Meetings: Not on file  . Marital Status: Not on file  Intimate Partner Violence:   . Fear of Current or  Ex-Partner: Not on file  . Emotionally Abused: Not on file  . Physically Abused: Not on file  . Sexually Abused: Not on file    Review of Systems: See HPI, otherwise negative ROS  Physical Exam: BP 130/87   Pulse 82   Temp 98.2 F (36.8 C) (Temporal)   Resp 16   Ht 5\' 4"  (1.626 m)   Wt 98.6 kg   LMP 09/01/2019 Comment: pregnancy test negative  SpO2 100%   BMI 37.32 kg/m  General:   Alert,  pleasant and cooperative in NAD Head:  Normocephalic and atraumatic. Neck:  Supple; no masses or thyromegaly. Lungs:  Clear throughout to auscultation.    Heart:  Regular rate and rhythm. Abdomen:  Soft, nontender and nondistended. Normal bowel sounds, without guarding, and without rebound.   Neurologic:  Alert and  oriented x4;  grossly normal neurologically.  Impression/Plan: Kimberly Boyle is here for an endoscopy and colonoscopy to be performed for nausea, vomiting and nonbloody diarrhea with leukocytosis and thrombocytosis, CT revealed possible colitis in the transverse colon  Risks, benefits, limitations, and alternatives regarding  endoscopy and colonoscopy have been reviewed with the patient.  Questions have been answered.  All parties agreeable.   Sherri Sear, MD  09/29/2019, 10:42 AM

## 2019-10-02 ENCOUNTER — Encounter: Payer: Self-pay | Admitting: *Deleted

## 2019-10-02 LAB — SURGICAL PATHOLOGY

## 2019-10-24 ENCOUNTER — Other Ambulatory Visit (HOSPITAL_COMMUNITY)
Admission: RE | Admit: 2019-10-24 | Discharge: 2019-10-24 | Disposition: A | Discharge status: Home / Self Care | Payer: Medicare Other | Source: Ambulatory Visit | Attending: Obstetrics and Gynecology | Admitting: Obstetrics and Gynecology

## 2019-10-24 ENCOUNTER — Other Ambulatory Visit: Payer: Self-pay

## 2019-10-24 ENCOUNTER — Ambulatory Visit (INDEPENDENT_AMBULATORY_CARE_PROVIDER_SITE_OTHER): Payer: Medicare Other | Admitting: Obstetrics and Gynecology

## 2019-10-24 ENCOUNTER — Encounter: Payer: Self-pay | Admitting: Obstetrics and Gynecology

## 2019-10-24 VITALS — BP 126/76 | HR 109 | Ht 63.0 in | Wt 219.0 lb

## 2019-10-24 DIAGNOSIS — Z113 Encounter for screening for infections with a predominantly sexual mode of transmission: Secondary | ICD-10-CM | POA: Insufficient documentation

## 2019-10-24 DIAGNOSIS — Z1151 Encounter for screening for human papillomavirus (HPV): Secondary | ICD-10-CM | POA: Diagnosis not present

## 2019-10-24 DIAGNOSIS — Z124 Encounter for screening for malignant neoplasm of cervix: Secondary | ICD-10-CM | POA: Diagnosis not present

## 2019-10-24 DIAGNOSIS — Z01419 Encounter for gynecological examination (general) (routine) without abnormal findings: Secondary | ICD-10-CM

## 2019-10-24 DIAGNOSIS — N9089 Other specified noninflammatory disorders of vulva and perineum: Secondary | ICD-10-CM

## 2019-10-24 MED ORDER — SULFAMETHOXAZOLE-TRIMETHOPRIM 800-160 MG PO TABS: 1.0000 | ORAL_TABLET | Freq: Two times a day (BID) | ORAL | Status: DC

## 2019-10-24 NOTE — Progress Notes (Signed)
Gynecology Annual Exam  PCP: Evie Lacks, NP  Chief Complaint:  Chief Complaint  Patient presents with  . Gynecologic Exam    in grown hair vaginal    History of Present Illness: Patient is a 24 y.o. G1P0010 presents for annual exam. The patient has no complaints today.   LMP: No LMP recorded. (Menstrual status: Irregular Periods). Average Interval: irregular, 1-3 months Duration of flow: 5 days Heavy Menses: no Clots: no Intermenstrual Bleeding: no Postcoital Bleeding: no Dysmenorrhea: no  The patient is not currently sexually active. There is no notable family history of breast or ovarian cancer in her family.  The patient wears seatbelts: yes.  The patient has regular exercise: not asked.    Also reports ingrown hair/boil on her right labia and thigh.  No drainage, no fevers, or chills.  Has had similar lesions before  Review of Systems: Review of Systems  Constitutional: Negative.  Negative for chills and fever.  HENT: Negative for congestion.   Respiratory: Negative for cough and shortness of breath.   Cardiovascular: Negative for chest pain and palpitations.  Gastrointestinal: Negative for abdominal pain, constipation, diarrhea, heartburn, nausea and vomiting.  Genitourinary: Negative for dysuria, frequency and urgency.  Skin: Positive for rash. Negative for itching.  Neurological: Negative for dizziness and headaches.  Endo/Heme/Allergies: Negative for polydipsia.  Psychiatric/Behavioral: Negative for depression.    Past Medical History:  Past Medical History:  Diagnosis Date  . Anxiety   . Arthritis   . Bipolar disorder (HCC)   . Depression   . Hidradenitis suppurativa 06/22/2019   Groin   . Major depressive disorder   . Panic disorder   . PID (acute pelvic inflammatory disease) 06/22/2019   06/22/19   . Poor historian 06/22/2019  . Schizophrenia [facility])     Past Surgical History:  Past Surgical History:  Procedure Laterality Date  .  COLONOSCOPY WITH PROPOFOL N/A 09/29/2019   Procedure: COLONOSCOPY WITH PROPOFOL;  Surgeon: Toney Reil, MD;  Location: [facility] ENDOSCOPY;  Service: Gastroenterology;  Laterality: N/A;  . ESOPHAGOGASTRODUODENOSCOPY (EGD) WITH PROPOFOL N/A 09/29/2019   Procedure: ESOPHAGOGASTRODUODENOSCOPY (EGD) WITH PROPOFOL;  Surgeon: Toney Reil, MD;  Location: [facility] ENDOSCOPY;  Service: Gastroenterology;  Laterality: N/A;    Gynecologic History:  No LMP recorded. (Menstrual status: Irregular Periods). Contraception: abstinence  Obstetric History: G1P0010  Family History:  Family History  Problem Relation Age of Onset  . Post-traumatic stress disorder Mother   . Depression Mother   . Depression Maternal Uncle   . Cancer Maternal Grandmother     Social History:  Social History   Socioeconomic History  . Marital status: Single    Spouse name: Not on file  . Number of children: 0  . Years of education: Not on file  . Highest education level: High school graduate  Occupational History    Comment: unemployed  Tobacco Use  . Smoking status: Current Every Day Smoker    Packs/day: 0.50    Years: 11.00    Pack years: 5.50    Types: Cigarettes  . Smokeless tobacco: Never Used  Substance and Sexual Activity  . Alcohol use: No  . Drug use: No    Types: Marijuana    Comment: none in 2 months  . Sexual activity: Not Currently  Other Topics Concern  . Not on file  Social History Narrative   ** Merged History Encounter **       Social Determinants of Health   Financial Resource Strain:   .  Difficulty of Paying Living Expenses: Not on file  Food Insecurity:   . Worried About Charity fundraiser in the Last Year: Not on file  . Ran Out of Food in the Last Year: Not on file  Transportation Needs:   . Lack of Transportation (Medical): Not on file  . Lack of Transportation (Non-Medical): Not on file  Physical Activity:   . Days of Exercise per Week: Not on file  . Minutes of  Exercise per Session: Not on file  Stress:   . Feeling of Stress : Not on file  Social Connections:   . Frequency of Communication with Friends and Family: Not on file  . Frequency of Social Gatherings with Friends and Family: Not on file  . Attends Religious Services: Not on file  . Active Member of Clubs or Organizations: Not on file  . Attends Archivist Meetings: Not on file  . Marital Status: Not on file  Intimate Partner Violence:   . Fear of Current or Ex-Partner: Not on file  . Emotionally Abused: Not on file  . Physically Abused: Not on file  . Sexually Abused: Not on file    Allergies:  Allergies  Allergen Reactions  . Penicillins Diarrhea and Nausea Only    Has patient had a PCN reaction causing immediate rash, facial/tongue/throat swelling, SOB or lightheadedness with hypotension:  no Has patient had a PCN reaction causing severe rash involving mucus membranes or skin necrosis:  no Has patient had a PCN reaction that required hospitalization: no Has patient had a PCN reaction occurring within the last 10 years: no If all of the above answers are "NO", then may proceed with Cephalosporin use.     Medications: Prior to Admission medications   Medication Sig Start Date End Date Taking? Authorizing Provider  benztropine (COGENTIN) 1 MG tablet Take 1 mg by mouth at bedtime. 06/09/19  Yes [provider]  clonazePAM (KLONOPIN) 0.5 MG tablet Take 1 tablet (0.5 mg total) by mouth 2 (two) times daily as needed (anxiety). 05/31/18  Yes Pucilowska, [name], MD  cloZAPine (CLOZARIL) 100 MG tablet Take 400 mg by mouth at bedtime. 07/15/18  Yes [provider]  fenofibrate (TRICOR) 145 MG tablet Take 145 mg by mouth daily. 08/22/19  Yes [provider]  lithium 300 MG tablet Take 750 mg by mouth 2 (two) times daily. 07/01/18  Yes [provider]  omeprazole (PRILOSEC) 40 MG capsule Take 1 capsule (40 mg total) by mouth 2 (two) times daily  before a meal. 09/29/19 10/29/19 Yes Vanga, Tally Due, MD  paliperidone (INVEGA SUSTENNA) 234 MG/1.5ML SUSY injection Inject 234 mg into the muscle every 28 (twenty-eight) days. Next dose on 07/06/2018 Patient taking differently: Inject 234 mg into the muscle every 28 (twenty-eight) days.  07/06/18  Yes Pucilowska, [name], MD  dicyclomine (BENTYL) 10 MG capsule Take 1 capsule (10 mg total) by mouth 4 (four) times daily -  before meals and at bedtime. 08/24/19 09/23/19  Lin Landsman, MD    Physical Exam Vitals: Blood pressure 126/76, pulse (!) 109, height 5\' 3"  (1.6 m), weight 219 lb (99.3 kg).  General: NAD HEENT: normocephalic, anicteric Thyroid: no enlargement, no palpable nodules Pulmonary: No increased work of breathing, CTAB Cardiovascular: RRR, distal pulses 2+ Breast: Breast symmetrical, no tenderness, no palpable nodules or masses, no skin or nipple retraction present, no nipple discharge.  No axillary or supraclavicular lymphadenopathy. Abdomen: NABS, soft, non-tender, non-distended.  Umbilicus without lesions.  No hepatomegaly, splenomegaly or masses palpable. No evidence of hernia  Genitourinary:  External: Normal external female genitalia.  Normal urethral meatus, normal Bartholin's and Skene's glands.   There is scarring noted in post groin creases and a 1.5cm boil on the medial right thigh  Vagina: Normal vaginal mucosa, no evidence of prolapse.    Cervix: Grossly normal in appearance, no bleeding  Uterus: Non-enlarged, mobile, normal contour.  No CMT  Adnexa: ovaries non-enlarged, no adnexal masses  Rectal: deferred  Lymphatic: no evidence of inguinal lymphadenopathy Extremities: no edema, erythema, or tenderness Neurologic: Grossly intact Psychiatric: mood appropriate, affect full  Female chaperone present for pelvic and breast  portions of the physical exam    Assessment: 24 y.o. G1P0010 routine annual exam  Plan: Problem List Items Addressed This Visit      None    Visit Diagnoses    Encounter for gynecological examination without abnormal finding    -  Primary   Screening for malignant neoplasm of cervix       Relevant Orders   Cytology - PAP   Routine screening for STI (sexually transmitted infection)       Relevant Orders   Cytology - PAP   HEP, RPR, HIV Panel (Completed)   Caruncle of labium          1) 4) Gardasil Series discussed and if applicable offered to patient - Patient has not previously completed 3 shot series  - given handout on gardasil  2) STI screening  wasoffered and accepted  3)  ASCCP guidelines and rational discussed.  Patient opts for every 3 years screening interval  4) Contraception - the patient is currently using  abstinence.  She is happy with her current form of contraception and plans to continue We discussed safe sex practices to reduce her furture risk of STI's.    5) Caruncle - suspect hidradenitis suppurativa, trial of bactrim   6)  Return in about 2 weeks (around 11/07/2019) for follow up exam.   Vena Austria, MD, [facility], [facility] 10/24/2019, 10:32 AM

## 2019-10-25 ENCOUNTER — Other Ambulatory Visit: Payer: Self-pay | Admitting: Obstetrics and Gynecology

## 2019-10-25 ENCOUNTER — Telehealth: Payer: Self-pay | Admitting: Obstetrics and Gynecology

## 2019-10-25 LAB — CYTOLOGY - PAP
Comment: NEGATIVE
Diagnosis: NEGATIVE
High risk HPV: NEGATIVE

## 2019-10-25 LAB — HEP, RPR, HIV PANEL
HIV Screen 4th Generation wRfx: NONREACTIVE
Hepatitis B Surface Ag: NEGATIVE
RPR Ser Ql: NONREACTIVE

## 2019-10-25 MED ORDER — SULFAMETHOXAZOLE-TRIMETHOPRIM 800-160 MG PO TABS: 1.0000 | ORAL_TABLET | Freq: Two times a day (BID) | ORAL | Status: DC

## 2019-10-25 NOTE — Telephone Encounter (Signed)
Patient was seen yesterday and prescribed antibiotic, she has checked with pharmacy yesterday and today and they do not have her Rx.  If this could be sent. [facility] [employer].

## 2019-10-25 NOTE — Telephone Encounter (Signed)
Advise

## 2019-10-25 NOTE — Telephone Encounter (Signed)
Was sent yesterday to [facility].  I've resent it   Sig: Take 1 tablet by mouth 2 (two) times daily.    Start Date: 10/24/19 End Date: -- Written Date: 10/24/19 Expiration Date: 10/23/20 Original Order:  sulfamethoxazole-trimethoprim (BACTRIM DS) 800-160 MG tablet [[id number]]

## 2019-10-31 ENCOUNTER — Other Ambulatory Visit: Payer: Self-pay

## 2019-10-31 ENCOUNTER — Ambulatory Visit: Payer: Medicare Other | Admitting: Gastroenterology

## 2019-10-31 ENCOUNTER — Encounter: Payer: Self-pay | Admitting: Gastroenterology

## 2019-11-07 ENCOUNTER — Ambulatory Visit: Payer: Medicare Other | Admitting: Obstetrics and Gynecology

## 2019-11-20 ENCOUNTER — Ambulatory Visit: Payer: Medicare Other | Attending: Internal Medicine

## 2019-11-20 DIAGNOSIS — Z20822 Contact with and (suspected) exposure to covid-19: Secondary | ICD-10-CM

## 2019-11-21 ENCOUNTER — Emergency Department
Admission: EM | Admit: 2019-11-21 | Discharge: 2019-11-22 | Disposition: A | Discharge status: Home / Self Care | Payer: Medicare Other | Attending: Emergency Medicine | Admitting: Emergency Medicine

## 2019-11-21 ENCOUNTER — Encounter: Payer: Self-pay | Admitting: Emergency Medicine

## 2019-11-21 ENCOUNTER — Other Ambulatory Visit: Payer: Self-pay

## 2019-11-21 DIAGNOSIS — F19951 Other psychoactive substance use, unspecified with psychoactive substance-induced psychotic disorder with hallucinations: Secondary | ICD-10-CM | POA: Insufficient documentation

## 2019-11-21 DIAGNOSIS — Z789 Other specified health status: Secondary | ICD-10-CM | POA: Diagnosis present

## 2019-11-21 DIAGNOSIS — F29 Unspecified psychosis not due to a substance or known physiological condition: Secondary | ICD-10-CM | POA: Diagnosis present

## 2019-11-21 DIAGNOSIS — T1491XA Suicide attempt, initial encounter: Secondary | ICD-10-CM | POA: Diagnosis present

## 2019-11-21 DIAGNOSIS — F1220 Cannabis dependence, uncomplicated: Secondary | ICD-10-CM | POA: Diagnosis present

## 2019-11-21 DIAGNOSIS — X789XXA Intentional self-harm by unspecified sharp object, initial encounter: Secondary | ICD-10-CM | POA: Diagnosis present

## 2019-11-21 DIAGNOSIS — S61519A Laceration without foreign body of unspecified wrist, initial encounter: Secondary | ICD-10-CM | POA: Diagnosis present

## 2019-11-21 DIAGNOSIS — R440 Auditory hallucinations: Secondary | ICD-10-CM | POA: Insufficient documentation

## 2019-11-21 DIAGNOSIS — F329 Major depressive disorder, single episode, unspecified: Secondary | ICD-10-CM | POA: Insufficient documentation

## 2019-11-21 DIAGNOSIS — Z79899 Other long term (current) drug therapy: Secondary | ICD-10-CM | POA: Insufficient documentation

## 2019-11-21 DIAGNOSIS — K30 Functional dyspepsia: Secondary | ICD-10-CM | POA: Diagnosis present

## 2019-11-21 DIAGNOSIS — T391X1A Poisoning by 4-Aminophenol derivatives, accidental (unintentional), initial encounter: Secondary | ICD-10-CM | POA: Diagnosis present

## 2019-11-21 DIAGNOSIS — F17210 Nicotine dependence, cigarettes, uncomplicated: Secondary | ICD-10-CM | POA: Insufficient documentation

## 2019-11-21 DIAGNOSIS — R45851 Suicidal ideations: Secondary | ICD-10-CM

## 2019-11-21 DIAGNOSIS — F411 Generalized anxiety disorder: Secondary | ICD-10-CM | POA: Insufficient documentation

## 2019-11-21 DIAGNOSIS — N730 Acute parametritis and pelvic cellulitis: Secondary | ICD-10-CM | POA: Diagnosis present

## 2019-11-21 DIAGNOSIS — F250 Schizoaffective disorder, bipolar type: Secondary | ICD-10-CM | POA: Diagnosis present

## 2019-11-21 DIAGNOSIS — R933 Abnormal findings on diagnostic imaging of other parts of digestive tract: Secondary | ICD-10-CM | POA: Diagnosis present

## 2019-11-21 DIAGNOSIS — F32A Depression, unspecified: Secondary | ICD-10-CM | POA: Diagnosis present

## 2019-11-21 DIAGNOSIS — F17200 Nicotine dependence, unspecified, uncomplicated: Secondary | ICD-10-CM | POA: Diagnosis present

## 2019-11-21 DIAGNOSIS — L732 Hidradenitis suppurativa: Secondary | ICD-10-CM | POA: Diagnosis present

## 2019-11-21 LAB — COMPREHENSIVE METABOLIC PANEL
ALT: 24 U/L (ref 0–44)
AST: 28 U/L (ref 15–41)
Albumin: 4 g/dL (ref 3.5–5.0)
Alkaline Phosphatase: 72 U/L (ref 38–126)
Anion gap: 7 (ref 5–15)
BUN: 10 mg/dL (ref 6–20)
CO2: 23 mmol/L (ref 22–32)
Calcium: 9.1 mg/dL (ref 8.9–10.3)
Chloride: 106 mmol/L (ref 98–111)
Creatinine, Ser: 0.68 mg/dL (ref 0.44–1.00)
GFR calc Af Amer: >60 mL/min (ref >60)
GFR calc non Af Amer: >60 mL/min (ref >60)
Glucose, Bld: 101 mg/dL — ABNORMAL HIGH (ref 70–99)
Potassium: 3.5 mmol/L (ref 3.5–5.1)
Sodium: 136 mmol/L (ref 135–145)
Total Bilirubin: 0.4 mg/dL (ref 0.3–1.2)
Total Protein: 6.7 g/dL (ref 6.5–8.1)

## 2019-11-21 LAB — CBC
HCT: 38.7 % (ref 36.0–46.0)
Hemoglobin: 12.4 g/dL (ref 12.0–15.0)
MCH: 28.9 pg (ref 26.0–34.0)
MCHC: 32 g/dL (ref 30.0–36.0)
MCV: 90.2 fL (ref 80.0–100.0)
Platelets: 426 10*3/uL — ABNORMAL HIGH (ref 150–400)
RBC: 4.29 MIL/uL (ref 3.87–5.11)
RDW: 13.2 % (ref 11.5–15.5)
WBC: 10.6 10*3/uL — ABNORMAL HIGH (ref 4.0–10.5)
nRBC: 0 % (ref 0.0–0.2)

## 2019-11-21 LAB — URINE DRUG SCREEN, QUALITATIVE (ARMC ONLY)
Amphetamines, Ur Screen: NOT DETECTED
Barbiturates, Ur Screen: NOT DETECTED
Benzodiazepine, Ur Scrn: NOT DETECTED
Cannabinoid 50 Ng, Ur ~~LOC~~: NOT DETECTED
Cocaine Metabolite,Ur ~~LOC~~: NOT DETECTED
MDMA (Ecstasy)Ur Screen: POSITIVE — AB
Methadone Scn, Ur: NOT DETECTED
Opiate, Ur Screen: NOT DETECTED
Phencyclidine (PCP) Ur S: NOT DETECTED
Tricyclic, Ur Screen: NOT DETECTED

## 2019-11-21 LAB — NOVEL CORONAVIRUS, NAA: SARS-CoV-2, NAA: NOT DETECTED

## 2019-11-21 LAB — LITHIUM LEVEL: Lithium Lvl: 0.12 mmol/L — ABNORMAL LOW (ref 0.60–1.20)

## 2019-11-21 LAB — ETHANOL: Alcohol, Ethyl (B): <10 mg/dL (ref <10)

## 2019-11-21 LAB — PREGNANCY, URINE: Preg Test, Ur: NEGATIVE

## 2019-11-21 LAB — SALICYLATE LEVEL: Salicylate Lvl: <7 mg/dL — ABNORMAL LOW (ref 7.0–30.0)

## 2019-11-21 LAB — ACETAMINOPHEN LEVEL: Acetaminophen (Tylenol), Serum: <10 ug/mL — ABNORMAL LOW (ref 10–30)

## 2019-11-21 MED ORDER — FENOFIBRATE 160 MG PO TABS
160.0000 mg | ORAL_TABLET | Freq: Every day | ORAL | Status: DC
  Administered 2019-11-22: 160 mg via ORAL
  Filled 2019-11-21: qty 1

## 2019-11-21 MED ORDER — TRAZODONE HCL 100 MG PO TABS
100.0000 mg | ORAL_TABLET | Freq: Every day | ORAL | Status: DC
  Administered 2019-11-21: 23:00:00 100 mg via ORAL
  Filled 2019-11-21: qty 1

## 2019-11-21 MED ORDER — CLONAZEPAM 0.5 MG PO TABS: 0.5000 mg | ORAL_TABLET | Freq: Two times a day (BID) | ORAL | Status: DC | PRN

## 2019-11-21 MED ORDER — LITHIUM CARBONATE 300 MG PO CAPS
300.0000 mg | ORAL_CAPSULE | Freq: Two times a day (BID) | ORAL | Status: DC
  Administered 2019-11-21 – 2019-11-22 (×2): 300 mg via ORAL
  Filled 2019-11-21 (×3): qty 1

## 2019-11-21 MED ORDER — PANTOPRAZOLE SODIUM 40 MG PO TBEC
40.0000 mg | DELAYED_RELEASE_TABLET | Freq: Every day | ORAL | Status: DC
  Administered 2019-11-21 – 2019-11-22 (×2): 40 mg via ORAL
  Filled 2019-11-21 (×2): qty 1

## 2019-11-21 MED ORDER — BENZTROPINE MESYLATE 1 MG PO TABS
1.0000 mg | ORAL_TABLET | Freq: Every day | ORAL | Status: DC
  Administered 2019-11-21: 1 mg via ORAL
  Filled 2019-11-21: qty 1

## 2019-11-21 MED ORDER — LITHIUM CARBONATE 300 MG PO TABS: 750.0000 mg | ORAL_TABLET | Freq: Two times a day (BID) | ORAL | Status: DC

## 2019-11-21 NOTE — ED Notes (Signed)
Pt belongings place at nurse station with RN on side A Black shoes, blue bra, blue leggings, x2 black sweater, pink teddy bear, red flannel shirt Jewelry: silver necklace, 3 silver rings, and gold ring Black wallet, and black coin bag 2 debit cards, cash app card, ID Card, and SS card.

## 2019-11-21 NOTE — ED Triage Notes (Signed)
FIRST NURSE NOTE- pt reports the voices are telling her to get into the oven. From group home. Staff with pt.

## 2019-11-21 NOTE — ED Notes (Signed)
Sandwich tray and ginger ale given to pt at this time.

## 2019-11-21 NOTE — ED Notes (Signed)
Urine sent from triage, lab called to run a urine preg .

## 2019-11-21 NOTE — ED Notes (Signed)
Pt given ice water at this time.  

## 2019-11-21 NOTE — ED Notes (Signed)
Patient reports she left group home last night and just started walking cause she was scared, reports the voices in her head were telling her to get in the oven. Patient reports cant live like this she thinks she needs medication adjustment. She reports feeling scared, denies SI/HI.

## 2019-11-21 NOTE — ED Provider Notes (Signed)
[facility] Emergency Department Provider Note  ____________________________________________   First MD Initiated Contact with Patient 11/21/19 1635     (approximate)  I have reviewed the triage vital signs and the nursing notes.   HISTORY  Chief Complaint Hallucinations    HPI Kimberly Boyle is a 24 y.o. female history of schizoaffective disorder, here with worsening auditory hallucinations.  Patient states that she has heard voices essentially daily for years.  However, over the last several days, she feels like the voices are getting progressively worse.  They have not been telling her to do things like get into the oven at her group home, and have also been telling her that she should kill herself.  They normally are present, but not this threatening.  She states that she believes that she no longer is getting the full effect of her medications.  She states her meds were last changed when she was admitted here.  No specific recent stressors.  No self-harm recently.  No fevers, chills, headache, or recent medical symptoms.        Past Medical History:  Diagnosis Date  . Anxiety   . Arthritis   . Bipolar disorder ([place])   . Depression   . Hidradenitis suppurativa 06/22/2019   Groin   . Major depressive disorder   . Panic disorder   . PID (acute pelvic inflammatory disease) 06/22/2019   06/22/19   . Poor historian 06/22/2019  . Schizophrenia [facility])     Patient Active Problem List   Diagnosis Date Noted  . Abnormal CT scan, colon   . Non-ulcer dyspepsia   . Hidradenitis suppurativa 06/22/2019  . Poor historian 06/22/2019  . PID (acute pelvic inflammatory disease) 06/22/2019  . Psychosis ([place]) 05/25/2019  . Suicidal ideation 07/20/2018  . Tobacco use disorder 05/08/2018  . Schizoaffective disorder, bipolar type ([place]) 05/06/2018  . Suicide attempt ([place]) 08/20/2017  . Cannabis use disorder, moderate, dependence ([place]) 08/20/2017  . Self-inflicted  laceration of wrist ([place]) 08/20/2017  . Acetaminophen poisoning 08/20/2017  . Generalized anxiety disorder 01/05/2017  . Depression 01/05/2017    Past Surgical History:  Procedure Laterality Date  . COLONOSCOPY WITH PROPOFOL N/A 09/29/2019   Procedure: COLONOSCOPY WITH PROPOFOL;  Surgeon: Lin Landsman, MD;  Location: [facility] ENDOSCOPY;  Service: Gastroenterology;  Laterality: N/A;  . ESOPHAGOGASTRODUODENOSCOPY (EGD) WITH PROPOFOL N/A 09/29/2019   Procedure: ESOPHAGOGASTRODUODENOSCOPY (EGD) WITH PROPOFOL;  Surgeon: Lin Landsman, MD;  Location: [facility] ENDOSCOPY;  Service: Gastroenterology;  Laterality: N/A;    Prior to Admission medications   Medication Sig Start Date End Date Taking? Authorizing Provider  benztropine (COGENTIN) 1 MG tablet Take 1 mg by mouth at bedtime. 06/09/19   [provider]  clonazePAM (KLONOPIN) 0.5 MG tablet Take 1 tablet (0.5 mg total) by mouth 2 (two) times daily as needed (anxiety). 05/31/18   Pucilowska, Herma Ard B, MD  cloZAPine (CLOZARIL) 100 MG tablet Take 400 mg by mouth at bedtime. 07/15/18   [provider]  dicyclomine (BENTYL) 10 MG capsule Take 1 capsule (10 mg total) by mouth 4 (four) times daily -  before meals and at bedtime. 08/24/19 09/23/19  Lin Landsman, MD  fenofibrate (TRICOR) 145 MG tablet Take 145 mg by mouth daily. 08/22/19   [provider]  lithium carbonate 300 MG capsule Take 300 mg by mouth 2 (two) times daily. 11/14/19   [provider]  omeprazole (PRILOSEC) 40 MG capsule Take 1 capsule (40 mg total) by mouth 2 (  two) times daily before a meal. 09/29/19 10/29/19  Vanga, Tally Due, MD  paliperidone (INVEGA SUSTENNA) 234 MG/1.5ML SUSY injection Inject 234 mg into the muscle every 28 (twenty-eight) days. Next dose on 07/06/2018 Patient taking differently: Inject 234 mg into the muscle every 28 (twenty-eight) days.  07/06/18   Pucilowska, Herma Ard B, MD  sulfamethoxazole-trimethoprim (BACTRIM DS)  800-160 MG tablet Take 1 tablet by mouth 2 (two) times daily. 10/25/19   Malachy Mood, MD  traZODone (DESYREL) 100 MG tablet Take 100 mg by mouth at bedtime. 10/18/19   [provider]    Allergies Penicillins  Family History  Problem Relation Age of Onset  . Post-traumatic stress disorder Mother   . Depression Mother   . Depression Maternal Uncle   . Cancer Maternal Grandmother     Social History Social History   Tobacco Use  . Smoking status: Current Every Day Smoker    Packs/day: 0.50    Years: 11.00    Pack years: 5.50    Types: Cigarettes  . Smokeless tobacco: Never Used  Substance Use Topics  . Alcohol use: No  . Drug use: Not Currently    Types: Marijuana    Review of Systems  Review of Systems  Constitutional: Negative for chills and fever.  HENT: Negative for sore throat.   Respiratory: Negative for shortness of breath.   Cardiovascular: Negative for chest pain.  Gastrointestinal: Negative for abdominal pain.  Genitourinary: Negative for flank pain.  Musculoskeletal: Negative for neck pain.  Skin: Negative for rash and wound.  Allergic/Immunologic: Negative for immunocompromised state.  Neurological: Negative for weakness and numbness.  Hematological: Does not bruise/bleed easily.  Psychiatric/Behavioral: Positive for confusion, hallucinations and suicidal ideas. The patient is nervous/anxious.   All other systems reviewed and are negative.    ____________________________________________  PHYSICAL EXAM:      VITAL SIGNS: ED Triage Vitals  Enc Vitals Group     BP 11/21/19 1556 126/72     Pulse Rate 11/21/19 1556 100     Resp 11/21/19 1556 16     Temp 11/21/19 1556 98.3 F (36.8 C)     Temp Source 11/21/19 1556 Oral     SpO2 11/21/19 1556 97 %     Weight 11/21/19 1602 212 lb (96.2 kg)     Height 11/21/19 1602 '5\' 4"'  (1.626 m)     Head Circumference --      Peak Flow --      Pain Score 11/21/19 1602 0     Pain Loc --      Pain Edu?  --      Excl. in [place]? --      Physical Exam Vitals and nursing note reviewed.  Constitutional:      General: She is not in acute distress.    Appearance: She is well-developed.  HENT:     Head: Normocephalic and atraumatic.  Eyes:     Conjunctiva/sclera: Conjunctivae normal.  Cardiovascular:     Rate and Rhythm: Normal rate and regular rhythm.     Heart sounds: Normal heart sounds.  Pulmonary:     Effort: Pulmonary effort is normal. No respiratory distress.     Breath sounds: No wheezing.  Abdominal:     General: There is no distension.  Musculoskeletal:     Cervical back: Neck supple.  Skin:    General: Skin is warm.     Capillary Refill: Capillary refill takes less than 2 seconds.     Findings: No rash.  Neurological:     Mental Status: She is alert and oriented to person, place, and time.     Motor: No abnormal muscle tone.  Psychiatric:     Comments: +SI, +command AH, no HI       ____________________________________________   LABS (all labs ordered are listed, but only abnormal results are displayed)  Labs Reviewed  COMPREHENSIVE METABOLIC PANEL - Abnormal; Notable for the following components:      Result Value   Glucose, Bld 101 (*)    All other components within normal limits  SALICYLATE LEVEL - Abnormal; Notable for the following components:   Salicylate Lvl <5.0 (*)    All other components within normal limits  ACETAMINOPHEN LEVEL - Abnormal; Notable for the following components:   Acetaminophen (Tylenol), Serum <10 (*)    All other components within normal limits  CBC - Abnormal; Notable for the following components:   WBC 10.6 (*)    Platelets 426 (*)    All other components within normal limits  URINE DRUG SCREEN, QUALITATIVE ([facility] ONLY) - Abnormal; Notable for the following components:   MDMA (Ecstasy)Ur Screen POSITIVE (*)    All other components within normal limits  ETHANOL  PREGNANCY, URINE  LITHIUM LEVEL  POC URINE PREG, ED     ____________________________________________  EKG:  ________________________________________  RADIOLOGY All imaging, including plain films, CT scans, and ultrasounds, independently reviewed by me, and interpretations confirmed via formal radiology reads.  ED MD interpretation:   None  Official radiology report(s): No results found.  ____________________________________________  PROCEDURES   Procedure(s) performed (including Critical Care):  Procedures  ____________________________________________  INITIAL IMPRESSION / MDM / [place] / ED COURSE  As part of my medical decision making, I reviewed the following data within the [facility] notes reviewed and incorporated, Old chart reviewed, Notes from prior ED visits, and [place] Controlled Substance Database       *Ilana Prezioso was evaluated in Emergency Department on 11/21/2019 for the symptoms described in the history of present illness. She was evaluated in the context of the global COVID-19 pandemic, which necessitated consideration that the patient might be at risk for infection with the SARS-CoV-2 virus that causes COVID-19. Institutional protocols and algorithms that pertain to the evaluation of patients at risk for COVID-19 are in a state of rapid change based on information released by regulatory bodies including the CDC and federal and state organizations. These policies and algorithms were followed during the patient's care in the ED.  Some ED evaluations and interventions may be delayed as a result of limited staffing during the pandemic.*     Medical Decision Making:  24 yo F with h/o schizoaffective disorder here with worsening command AH and SI. No apparent medical complaints. +MDMA on UDS, though does not appear acutely intoxicated currently. TTS/Psych c/s. Here voluntary ATM.  ____________________________________________  FINAL CLINICAL IMPRESSION(S) / ED DIAGNOSES  Final  diagnoses:  Suicidal ideation  Auditory hallucination     MEDICATIONS GIVEN DURING THIS VISIT:  Medications  benztropine (COGENTIN) tablet 1 mg (has no administration in time range)  clonazePAM (KLONOPIN) tablet 0.5 mg (has no administration in time range)  fenofibrate tablet 160 mg (has no administration in time range)  pantoprazole (PROTONIX) EC tablet 40 mg (40 mg Oral Given 11/21/19 1755)  traZODone (DESYREL) tablet 100 mg (has no administration in time range)  lithium carbonate capsule 300 mg (has no administration in time range)     ED Discharge  Orders    None       Note:  This document was prepared using Dragon voice recognition software and may include unintentional dictation errors.   Duffy Bruce, MD 11/21/19 [phone]

## 2019-11-21 NOTE — ED Triage Notes (Signed)
Pt in via POV from [facility]; reports running away last night due to hear voices tell her "They are going to put me in the stove."  Per Group Home staff, since being back today, patient has verbalized SI.    Pt remorseful in triage toward staff, states, "I just cant deal with these voices.  Hopefully they will adjust my meds."  Calm, cooperative in triage, NAD noted at this time.

## 2019-11-22 NOTE — ED Notes (Signed)
Psych MD and TTS at bedside.

## 2019-11-22 NOTE — ED Provider Notes (Signed)
-----------------------------------------   11:38 AM on 11/22/2019 -----------------------------------------   Blood pressure 115/63, pulse 85, temperature 98.3 F (36.8 C), temperature source Oral, resp. rate 20, height 5\' 4"  (1.626 m), weight 96.2 kg, SpO2 97 %.  The patient is calm and cooperative at this time.  There have been no acute events since the last update.  Patient has been cleared by psychiatry for discharge back to group home.   , MD 11/22/19 1139

## 2019-11-22 NOTE — Consult Note (Signed)
[facility] Face-to-Face Psychiatry Consult   Reason for Consult: Hallucinations Referring Physician: Dr. Ellender Hose Patient Identification: Kimberly Boyle MRN:  [id number] Principal Diagnosis: <principal problem not specified> Diagnosis:  Active Problems:   Generalized anxiety disorder   Depression   Suicide attempt ([place])   Cannabis use disorder, moderate, dependence ([place])   Self-inflicted laceration of wrist ([place])   Acetaminophen poisoning   Schizoaffective disorder, bipolar type ([place])   Tobacco use disorder   Suicidal ideation   Psychosis ([place])   Hidradenitis suppurativa   Poor historian   PID (acute pelvic inflammatory disease)   Abnormal CT scan, colon   Non-ulcer dyspepsia   Total Time spent with patient: 30 minutes  Subjective: "The voices are telling me to get into the oven." Kimberly Boyle is a 24 y.o. female patient presented to [facility] ED via POV with group home staff with the patient. The patient resides at [place]. The patient's mother passed away in 2018/08/17).  The patient stated she left the group home last night and met up with her female friends. He got her a hotel room. She disclosed she stayed in the room alone until this morning. She checked out of the room and began walking until she got to a church. The pastor give her a ride to [facility]. She stated that when she got to the restaurant and ate lunch, the police arrived and took her back to her group home. The patient is positive for MDMA (Ecstasy)Ur Screen. She voice, are you sure it was my urine? She stated, "maybe my friend put it in  my drink when I was not looking."  The patient was seen face-to-face by this provider; chart reviewed and consulted with Dr. Ellender Hose on 11/21/2019 due to the patient's care. It was discussed with the EDP that the patient would remain under observation overnight and reassess in the a.m. to determine if she meets criteria for psychiatric inpatient admission or could be discharged  back to her group home. The patient is alert and oriented x 4, calm, cooperative, and mood-congruent with affect on evaluation. The patient does not appear to be responding to internal or external stimuli. Neither is the patient presenting with any delusional thinking. The patient admits to auditory and visual hallucinations. The patient admits to suicidal ideations but denies homicidal or self-harm ideations. The patient is not presenting with any psychotic or paranoid behaviors. During an encounter with the patient, she was able to answer questions appropriately.  Plan: The patient will remain under observation overnight and reassess in the a.m. to determine if she meets criteria for psychiatric inpatient admission or could be discharged back to her group home. HPI: Per Dr. Ellender Hose: Kimberly Boyle is a 24 y.o. female history of schizoaffective disorder, here with worsening auditory hallucinations.  Patient states that she has heard voices essentially daily for years.  However, over the last several days, she feels like the voices are getting progressively worse.  They have not been telling her to do things like get into the oven at her group home, and have also been telling her that she should kill herself.  They normally are present, but not this threatening.  She states that she believes that she no longer is getting the full effect of her medications.  She states her meds were last changed when she was admitted here.  No specific recent stressors.  No self-harm recently.  No fevers, chills, headache, or recent medical symptoms.  Past Psychiatric History:  Anxiety Bipolar disorder ([place]) Depression Major depressive disorder Panic disorder Schizophrenia ([place])  Risk to Self:   No Risk to Others:  No Prior Inpatient Therapy:  Yes Prior Outpatient Therapy:  Yes  Past Medical History:  Past Medical History:  Diagnosis Date  . Anxiety   . Arthritis   . Bipolar disorder ([place])   . Depression    . Hidradenitis suppurativa 06/22/2019   Groin   . Major depressive disorder   . Panic disorder   . PID (acute pelvic inflammatory disease) 06/22/2019   06/22/19   . Poor historian 06/22/2019  . Schizophrenia [facility])     Past Surgical History:  Procedure Laterality Date  . COLONOSCOPY WITH PROPOFOL N/A 09/29/2019   Procedure: COLONOSCOPY WITH PROPOFOL;  Surgeon: Lin Landsman, MD;  Location: [facility] ENDOSCOPY;  Service: Gastroenterology;  Laterality: N/A;  . ESOPHAGOGASTRODUODENOSCOPY (EGD) WITH PROPOFOL N/A 09/29/2019   Procedure: ESOPHAGOGASTRODUODENOSCOPY (EGD) WITH PROPOFOL;  Surgeon: Lin Landsman, MD;  [facility] END[facility]COPY;  Service: Gastroenterology;  Laterality: N/A;   Family History:  Family History  Problem Relation Age of Onset  . Post-traumatic stress disorder Mother   . Depression Mother   . Depression Maternal Uncle   . Cancer Maternal Grandmother    Family Psychiatric  History:  Social History:  Social History   Substance and Sexual Activity  Alcohol Use No     Social History   Substance and Sexual Activity  Drug Use Not Currently  . Types: Marijuana    Social History   Socioeconomic History  . Marital status: Single    Spouse name: Not on file  . Number of children: 0  . Years of education: Not on file  . Highest education level: High school graduate  Occupational History    Comment: unemployed  Tobacco Use  . Smoking status: Current Every Day Smoker    Packs/day: 0.50    Years: 11.00    Pack years: 5.50    Types: Cigarettes  . Smokeless tobacco: Never Used  Substance and Sexual Activity  . Alcohol use: No  . Drug use: Not Currently    Types: Marijuana  . Sexual activity: Not Currently  Other Topics Concern  . Not on file  Social History Narrative   ** Merged History Encounter **       Social Determinants of Health   Financial Resource Strain:   . Difficulty of Paying Living Expenses: Not on file  Food Insecurity:   .  Worried About Charity fundraiser in the Last Year: Not on file  . Ran Out of Food in the Last Year: Not on file  Transportation Needs:   . Lack of Transportation (Medical): Not on file  . Lack of Transportation (Non-Medical): Not on file  Physical Activity:   . Days of Exercise per Week: Not on file  . Minutes of Exercise per Session: Not on file  Stress:   . Feeling of Stress : Not on file  Social Connections:   . Frequency of Communication with Friends and Family: Not on file  . Frequency of Social Gatherings with Friends and Family: Not on file  . Attends Religious Services: Not on file  . Active Member of Clubs or Organizations: Not on file  . Attends Archivist Meetings: Not on file  . Marital Status: Not on file   Additional Social History:    Allergies:   Allergies  Allergen Reactions  . Penicillins Diarrhea and Nausea Only  Has patient had a PCN reaction causing immediate rash, facial/tongue/throat swelling, SOB or lightheadedness with hypotension:  no Has patient had a PCN reaction causing severe rash involving mucus membranes or skin necrosis:  no Has patient had a PCN reaction that required hospitalization: no Has patient had a PCN reaction occurring within the last 10 years: no If all of the above answers are "NO", then may proceed with Cephalosporin use.     Labs:  Results for orders placed or performed during the hospital encounter of 11/21/19 (from the past 48 hour(s))  Comprehensive metabolic panel     Status: Abnormal   Collection Time: 11/21/19  4:16 PM  Result Value Ref Range   Sodium 136 135 - 145 mmol/L   Potassium 3.5 3.5 - 5.1 mmol/L   Chloride 106 98 - 111 mmol/L   CO2 23 22 - 32 mmol/L   Glucose, Bld 101 (H) 70 - 99 mg/dL   BUN 10 6 - 20 mg/dL   Creatinine, Ser 0.68 0.44 - 1.00 mg/dL   Calcium 9.1 8.9 - 10.3 mg/dL   Total Protein 6.7 6.5 - 8.1 g/dL   Albumin 4.0 3.5 - 5.0 g/dL   AST 28 15 - 41 U/L   ALT 24 0 - 44 U/L   Alkaline  Phosphatase 72 38 - 126 U/L   Total Bilirubin 0.4 0.3 - 1.2 mg/dL   GFR calc non Af Amer >60 >60 mL/min   GFR calc Af Amer >60 >60 mL/min   Anion gap 7 5 - 15    Comment: Performed at [facility], [address] [place], [place] [address]  Ethanol     Status: None   Collection Time: 11/21/19  4:16 PM  Result Value Ref Range   Alcohol, Ethyl (B) <10 <10 mg/dL    Comment: (NOTE) Lowest detectable limit for serum alcohol is 10 mg/dL. For medical purposes only. Performed at [facility], [address] [place], [place] [address]   Salicylate level     Status: Abnormal   Collection Time: 11/21/19  4:16 PM  Result Value Ref Range   Salicylate Lvl <9.0 (L) 7.0 - 30.0 mg/dL    Comment: Performed at [facility], [address] [place], [place] [address]  Acetaminophen level     Status: Abnormal   Collection Time: 11/21/19  4:16 PM  Result Value Ref Range   Acetaminophen (Tylenol), Serum <10 (L) 10 - 30 ug/mL    Comment: (NOTE) Therapeutic concentrations vary significantly. A range of 10-30 ug/mL  may be an effective concentration for many patients. However, some  are best treated at concentrations outside of this range. Acetaminophen concentrations >150 ug/mL at 4 hours after ingestion  and >50 ug/mL at 12 hours after ingestion are often associated with  toxic reactions. Performed at [facility], [address] [place], [place] [address]   cbc     Status: Abnormal   Collection Time: 11/21/19  4:16 PM  Result Value Ref Range   WBC 10.6 (H) 4.0 - 10.5 K/uL   RBC 4.29 3.87 - 5.11 MIL/uL   Hemoglobin 12.4 12.0 - 15.0 g/dL   HCT 38.7 36.0 - 46.0 %   MCV 90.2 80.0 - 100.0 fL   MCH 28.9 26.0 - 34.0 pg   MCHC 32.0 30.0 - 36.0 g/dL   RDW 13.2 11.5 - 15.5 %   Platelets 426 (H) 150 - 400 K/uL   nRBC 0.0 0.0 - 0.2 %  Comment: Performed at [facility], [address] [place], [place] [address]  Urine Drug Screen, Qualitative      Status: Abnormal   Collection Time: 11/21/19  4:16 PM  Result Value Ref Range   Tricyclic, Ur Screen NONE DETECTED NONE DETECTED   Amphetamines, Ur Screen NONE DETECTED NONE DETECTED   MDMA (Ecstasy)Ur Screen POSITIVE (A) NONE DETECTED   Cocaine Metabolite,Ur [place] NONE DETECTED NONE DETECTED   Opiate, Ur Screen NONE DETECTED NONE DETECTED   Phencyclidine (PCP) Ur S NONE DETECTED NONE DETECTED   Cannabinoid 50 Ng, Ur [place] NONE DETECTED NONE DETECTED   Barbiturates, Ur Screen NONE DETECTED NONE DETECTED   Benzodiazepine, Ur Scrn NONE DETECTED NONE DETECTED   Methadone Scn, Ur NONE DETECTED NONE DETECTED    Comment: (NOTE) Tricyclics + metabolites, urine    Cutoff 1000 ng/mL Amphetamines + metabolites, urine  Cutoff 1000 ng/mL MDMA (Ecstasy), urine              Cutoff 500 ng/mL Cocaine Metabolite, urine          Cutoff 300 ng/mL Opiate + metabolites, urine        Cutoff 300 ng/mL Phencyclidine (PCP), urine         Cutoff 25 ng/mL Cannabinoid, urine                 Cutoff 50 ng/mL Barbiturates + metabolites, urine  Cutoff 200 ng/mL Benzodiazepine, urine              Cutoff 200 ng/mL Methadone, urine                   Cutoff 300 ng/mL The urine drug screen provides only a preliminary, unconfirmed analytical test result and should not be used for non-medical purposes. Clinical consideration and professional judgment should be applied to any positive drug screen result due to possible interfering substances. A more specific alternate chemical method must be used in order to obtain a confirmed analytical result. Gas chromatography / mass spectrometry (GC/MS) is the preferred confirmat ory method. Performed at [facility], [address] [place], [place] [address]   Lithium level     Status: Abnormal   Collection Time: 11/21/19  4:16 PM  Result Value Ref Range   Lithium Lvl 0.12 (L) 0.60 - 1.20 mmol/L    Comment: Performed at [facility], [address]  [place], [place] [address]  Pregnancy, urine     Status: None   Collection Time: 11/21/19  4:16 PM  Result Value Ref Range   Preg Test, Ur NEGATIVE NEGATIVE    Comment: Performed at [facility], [address] [place], [place] [address]    Current Facility-Administered Medications  Medication Dose Route Frequency Provider Last Rate Last Admin  . benztropine (COGENTIN) tablet 1 mg  1 mg Oral QHS Duffy Bruce, MD   1 mg at 11/21/19 2234  . clonazePAM (KLONOPIN) tablet 0.5 mg  0.5 mg Oral BID PRN Duffy Bruce, MD      . fenofibrate tablet 160 mg  160 mg Oral Daily Duffy Bruce, MD      . lithium carbonate capsule 300 mg  300 mg Oral Q12H Lu Duffel, RPH   300 mg at 11/21/19 2234  . pantoprazole (PROTONIX) EC tablet 40 mg  40 mg Oral Daily Duffy Bruce, MD   40 mg at 11/21/19 1755  . traZODone (DESYREL) tablet 100 mg  100 mg Oral QHS Duffy Bruce, MD  100 mg at 11/21/19 2234   Current Outpatient Medications  Medication Sig Dispense Refill  . benztropine (COGENTIN) 1 MG tablet Take 1 mg by mouth at bedtime.    . clonazePAM (KLONOPIN) 0.5 MG tablet Take 1 tablet (0.5 mg total) by mouth 2 (two) times daily as needed (anxiety). 60 tablet 0  . cloZAPine (CLOZARIL) 100 MG tablet Take 400 mg by mouth at bedtime.  5  . dicyclomine (BENTYL) 10 MG capsule Take 1 capsule (10 mg total) by mouth 4 (four) times daily -  before meals and at bedtime. 120 capsule 0  . fenofibrate (TRICOR) 145 MG tablet Take 145 mg by mouth daily.    Marland Kitchen lithium carbonate 300 MG capsule Take 300 mg by mouth 2 (two) times daily.    Marland Kitchen omeprazole (PRILOSEC) 40 MG capsule Take 1 capsule (40 mg total) by mouth 2 (two) times daily before a meal. 60 capsule 2  . paliperidone (INVEGA SUSTENNA) 234 MG/1.5ML SUSY injection Inject 234 mg into the muscle every 28 (twenty-eight) days. Next dose on 07/06/2018 (Patient taking differently: Inject 234 mg into the muscle every 28 (twenty-eight) days. ) 1.8 mL 1  .  sulfamethoxazole-trimethoprim (BACTRIM DS) 800-160 MG tablet Take 1 tablet by mouth 2 (two) times daily. 20 tablet -  . traZODone (DESYREL) 100 MG tablet Take 100 mg by mouth at bedtime.      Musculoskeletal: Strength & Muscle Tone: within normal limits Gait & Station: normal Patient leans: N/A  Psychiatric Specialty Exam: Physical Exam  Nursing note and vitals reviewed. Constitutional: She appears well-developed and well-nourished.  Cardiovascular: Normal rate.  Respiratory: Effort normal.  Musculoskeletal:        General: Normal range of motion.     Cervical back: Normal range of motion and neck supple.  Neurological: She is alert.    Review of Systems  Blood pressure 115/63, pulse 85, temperature 98.3 F (36.8 C), temperature source Oral, resp. rate 20, height '5\' 4"'  (1.626 m), weight 96.2 kg, SpO2 97 %.Body mass index is 36.39 kg/m.  General Appearance: Casual  Eye Contact:  Good  Speech:  Clear and Coherent  Volume:  Normal  Mood:  NA  Affect:  Appropriate  Thought Process:  Coherent  Orientation:  Full (Time, Place, and Person)  Thought Content:  Logical and Hallucinations: Auditory Command:  Telling her to get into the oven  Suicidal Thoughts:  No  Homicidal Thoughts:  No  Memory:  Immediate;   Good Recent;   Good Remote;   Good  Judgement:  Fair  Insight:  Lacking  Psychomotor Activity:  Normal  Concentration:  Concentration: Good and Attention Span: Good  Recall:  Good  Fund of Knowledge:  Good  Language:  Good  Akathisia:  Negative  Handed:  Right  AIMS (if indicated):     Assets:  Desire for Improvement Leisure Time Resilience  ADL's:  Intact  Cognition:  WNL  Sleep:    Good     Treatment Plan Summary: Daily contact with patient to assess and evaluate symptoms and progress in treatment, Medication management and Plan The patient will remain under observation overnight and reassess in the a.m. to determine if she meets criteria for psychiatric  inpatient admission or could be discharged back to her group home  Disposition: Supportive therapy provided about ongoing stressors. The patient will remain under observation overnight and reassess in the a.m. to determine if she meets criteria for psychiatric inpatient admission or could be discharged back to her  group home.  Caroline Sauger, NP 11/22/2019 12:34 AM

## 2019-11-22 NOTE — BH Assessment (Signed)
Writer spoke with patient to complete updated/reassessment. Patient denies SI/HI and AV/H. 

## 2019-11-22 NOTE — Consult Note (Signed)
[facility] Face-to-Face Psychiatry Consult   Reason for Consult: Hallucinations Referring Physician: Dr. Ellender Hose Patient Identification: Kimberly Boyle MRN:  [id number] Principal Diagnosis: <principal problem not specified> Diagnosis:  Active Problems:   Generalized anxiety disorder   Depression   Suicide attempt ([place])   Cannabis use disorder, moderate, dependence ([place])   Self-inflicted laceration of wrist ([place])   Acetaminophen poisoning   Schizoaffective disorder, bipolar type ([place])   Tobacco use disorder   Suicidal ideation   Psychosis ([place])   Hidradenitis suppurativa   Poor historian   PID (acute pelvic inflammatory disease)   Abnormal CT scan, colon   Non-ulcer dyspepsia    Patient reassed this morning. Now denying symptoms. Reports feeling safe returning home. Deneis any current complaints or issues.  Original NP note below"    Total Time spent with patient: 30 minutes  Subjective: "The voices are telling me to get into the oven." Kimberly Boyle is a 24 y.o. female patient presented to [facility] ED via POV with group home staff with the patient. The patient resides at [place]. The patient's mother passed away in 31-Aug-2018).  The patient stated she left the group home last night and met up with her female friends. He got her a hotel room. She disclosed she stayed in the room alone until this morning. She checked out of the room and began walking until she got to a church. The pastor give her a ride to [facility]. She stated that when she got to the restaurant and ate lunch, the police arrived and took her back to her group home. The patient is positive for MDMA (Ecstasy)Ur Screen. She voice, are you sure it was my urine? She stated, "maybe my friend put it in  my drink when I was not looking."  The patient was seen face-to-face by this provider; chart reviewed and consulted with Dr. Ellender Hose on 11/21/2019 due to the patient's care. It was discussed with the EDP that the  patient would remain under observation overnight and reassess in the a.m. to determine if she meets criteria for psychiatric inpatient admission or could be discharged back to her group home. The patient is alert and oriented x 4, calm, cooperative, and mood-congruent with affect on evaluation. The patient does not appear to be responding to internal or external stimuli. Neither is the patient presenting with any delusional thinking. The patient admits to auditory and visual hallucinations. The patient admits to suicidal ideations but denies homicidal or self-harm ideations. The patient is not presenting with any psychotic or paranoid behaviors. During an encounter with the patient, she was able to answer questions appropriately.  Plan: The patient will remain under observation overnight and reassess in the a.m. to determine if she meets criteria for psychiatric inpatient admission or could be discharged back to her group home. HPI: Per Dr. Ellender Hose: Kimberly Boyle is a 24 y.o. female history of schizoaffective disorder, here with worsening auditory hallucinations.  Patient states that she has heard voices essentially daily for years.  However, over the last several days, she feels like the voices are getting progressively worse.  They have not been telling her to do things like get into the oven at her group home, and have also been telling her that she should kill herself.  They normally are present, but not this threatening.  She states that she believes that she no longer is getting the full effect of her medications.  She states her meds were last changed  when she was admitted here.  No specific recent stressors.  No self-harm recently.  No fevers, chills, headache, or recent medical symptoms.  Past Psychiatric History:  Anxiety Bipolar disorder ([place]) Depression Major depressive disorder Panic disorder Schizophrenia ([place])  Risk to Self:   No Risk to Others:  No Prior Inpatient Therapy:   Yes Prior Outpatient Therapy:  Yes  Past Medical History:  Past Medical History:  Diagnosis Date  . Anxiety   . Arthritis   . Bipolar disorder ([place])   . Depression   . Hidradenitis suppurativa 06/22/2019   Groin   . Major depressive disorder   . Panic disorder   . PID (acute pelvic inflammatory disease) 06/22/2019   06/22/19   . Poor historian 06/22/2019  . Schizophrenia [facility])     Past Surgical History:  Procedure Laterality Date  . COLONOSCOPY WITH PROPOFOL N/A 09/29/2019   Procedure: COLONOSCOPY WITH PROPOFOL;  Surgeon: Lin Landsman, MD;  Location: [facility] ENDOSCOPY;  Service: Gastroenterology;  Laterality: N/A;  . ESOPHAGOGASTRODUODENOSCOPY (EGD) WITH PROPOFOL N/A 09/29/2019   Procedure: ESOPHAGOGASTRODUODENOSCOPY (EGD) WITH PROPOFOL;  Surgeon: Lin Landsman, MD;  Location: [facility] ENDOSCOPY;  Service: Gastroenterology;  Laterality: N/A;   Family History:  Family History  Problem Relation Age of Onset  . Post-traumatic stress disorder Mother   . Depression Mother   . Depression Maternal Uncle   . Cancer Maternal Grandmother    Family Psychiatric  History:  Social History:  Social History   Substance and Sexual Activity  Alcohol Use No     Social History   Substance and Sexual Activity  Drug Use Not Currently  . Types: Marijuana    Social History   Socioeconomic History  . Marital status: Single    Spouse name: Not on file  . Number of children: 0  . Years of education: Not on file  . Highest education level: High school graduate  Occupational History    Comment: unemployed  Tobacco Use  . Smoking status: Current Every Day Smoker    Packs/day: 0.50    Years: 11.00    Pack years: 5.50    Types: Cigarettes  . Smokeless tobacco: Never Used  Substance and Sexual Activity  . Alcohol use: No  . Drug use: Not Currently    Types: Marijuana  . Sexual activity: Not Currently  Other Topics Concern  . Not on file  Social History Narrative   ** Merged  History Encounter **       Social Determinants of Health   Financial Resource Strain:   . Difficulty of Paying Living Expenses: Not on file  Food Insecurity:   . Worried About Charity fundraiser in the Last Year: Not on file  . Ran Out of Food in the Last Year: Not on file  Transportation Needs:   . Lack of Transportation (Medical): Not on file  . Lack of Transportation (Non-Medical): Not on file  Physical Activity:   . Days of Exercise per Week: Not on file  . Minutes of Exercise per Session: Not on file  Stress:   . Feeling of Stress : Not on file  Social Connections:   . Frequency of Communication with Friends and Family: Not on file  . Frequency of Social Gatherings with Friends and Family: Not on file  . Attends Religious Services: Not on file  . Active Member of Clubs or Organizations: Not on file  . Attends Archivist Meetings: Not on file  . Marital Status:  Not on file   Additional Social History:    Allergies:   Allergies  Allergen Reactions  . Penicillins Diarrhea and Nausea Only    Has patient had a PCN reaction causing immediate rash, facial/tongue/throat swelling, SOB or lightheadedness with hypotension:  no Has patient had a PCN reaction causing severe rash involving mucus membranes or skin necrosis:  no Has patient had a PCN reaction that required hospitalization: no Has patient had a PCN reaction occurring within the last 10 years: no If all of the above answers are "NO", then may proceed with Cephalosporin use.     Labs:  Results for orders placed or performed during the hospital encounter of 11/21/19 (from the past 48 hour(s))  Comprehensive metabolic panel     Status: Abnormal   Collection Time: 11/21/19  4:16 PM  Result Value Ref Range   Sodium 136 135 - 145 mmol/L   Potassium 3.5 3.5 - 5.1 mmol/L   Chloride 106 98 - 111 mmol/L   CO2 23 22 - 32 mmol/L   Glucose, Bld 101 (H) 70 - 99 mg/dL   BUN 10 6 - 20 mg/dL   Creatinine, Ser 0.68  0.44 - 1.00 mg/dL   Calcium 9.1 8.9 - 10.3 mg/dL   Total Protein 6.7 6.5 - 8.1 g/dL   Albumin 4.0 3.5 - 5.0 g/dL   AST 28 15 - 41 U/L   ALT 24 0 - 44 U/L   Alkaline Phosphatase 72 38 - 126 U/L   Total Bilirubin 0.4 0.3 - 1.2 mg/dL   GFR calc non Af Amer >60 >60 mL/min   GFR calc Af Amer >60 >60 mL/min   Anion gap 7 5 - 15    Comment: Performed at [facility], [address] [place], [place] [address]  Ethanol     Status: None   Collection Time: 11/21/19  4:16 PM  Result Value Ref Range   Alcohol, Ethyl (B) <10 <10 mg/dL    Comment: (NOTE) Lowest detectable limit for serum alcohol is 10 mg/dL. For medical purposes only. Performed at [facility], [address] [place], [place] [address]   Salicylate level     Status: Abnormal   Collection Time: 11/21/19  4:16 PM  Result Value Ref Range   Salicylate Lvl <8.1 (L) 7.0 - 30.0 mg/dL    Comment: Performed at [facility], [address] [place], [place] [address]  Acetaminophen level     Status: Abnormal   Collection Time: 11/21/19  4:16 PM  Result Value Ref Range   Acetaminophen (Tylenol), Serum <10 (L) 10 - 30 ug/mL    Comment: (NOTE) Therapeutic concentrations vary significantly. A range of 10-30 ug/mL  may be an effective concentration for many patients. However, some  are best treated at concentrations outside of this range. Acetaminophen concentrations >150 ug/mL at 4 hours after ingestion  and >50 ug/mL at 12 hours after ingestion are often associated with  toxic reactions. Performed at [facility], [address] [place], [place] [address]   cbc     Status: Abnormal   Collection Time: 11/21/19  4:16 PM  Result Value Ref Range   WBC 10.6 (H) 4.0 - 10.5 K/uL   RBC 4.29 3.87 - 5.11 MIL/uL   Hemoglobin 12.4 12.0 - 15.0 g/dL   HCT 38.7 36.0 - 46.0 %   MCV 90.2 80.0 - 100.0 fL   MCH 28.9 26.0 - 34.0 pg   MCHC 32.0 30.0 - 36.0 g/dL  RDW 13.2 11.5 - 15.5 %   Platelets 426  (H) 150 - 400 K/uL   nRBC 0.0 0.0 - 0.2 %    Comment: Performed at [facility], [address] [place], [place] [address]  Urine Drug Screen, Qualitative     Status: Abnormal   Collection Time: 11/21/19  4:16 PM  Result Value Ref Range   Tricyclic, Ur Screen NONE DETECTED NONE DETECTED   Amphetamines, Ur Screen NONE DETECTED NONE DETECTED   MDMA (Ecstasy)Ur Screen POSITIVE (A) NONE DETECTED   Cocaine Metabolite,Ur [place] NONE DETECTED NONE DETECTED   Opiate, Ur Screen NONE DETECTED NONE DETECTED   Phencyclidine (PCP) Ur S NONE DETECTED NONE DETECTED   Cannabinoid 50 Ng, Ur [place] NONE DETECTED NONE DETECTED   Barbiturates, Ur Screen NONE DETECTED NONE DETECTED   Benzodiazepine, Ur Scrn NONE DETECTED NONE DETECTED   Methadone Scn, Ur NONE DETECTED NONE DETECTED    Comment: (NOTE) Tricyclics + metabolites, urine    Cutoff 1000 ng/mL Amphetamines + metabolites, urine  Cutoff 1000 ng/mL MDMA (Ecstasy), urine              Cutoff 500 ng/mL Cocaine Metabolite, urine          Cutoff 300 ng/mL Opiate + metabolites, urine        Cutoff 300 ng/mL Phencyclidine (PCP), urine         Cutoff 25 ng/mL Cannabinoid, urine                 Cutoff 50 ng/mL Barbiturates + metabolites, urine  Cutoff 200 ng/mL Benzodiazepine, urine              Cutoff 200 ng/mL Methadone, urine                   Cutoff 300 ng/mL The urine drug screen provides only a preliminary, unconfirmed analytical test result and should not be used for non-medical purposes. Clinical consideration and professional judgment should be applied to any positive drug screen result due to possible interfering substances. A more specific alternate chemical method must be used in order to obtain a confirmed analytical result. Gas chromatography / mass spectrometry (GC/MS) is the preferred confirmat ory method. Performed at [facility], [address] [place], [place] [address]   Lithium level     Status: Abnormal    Collection Time: 11/21/19  4:16 PM  Result Value Ref Range   Lithium Lvl 0.12 (L) 0.60 - 1.20 mmol/L    Comment: Performed at [facility], [address] [place], [place] [address]  Pregnancy, urine     Status: None   Collection Time: 11/21/19  4:16 PM  Result Value Ref Range   Preg Test, Ur NEGATIVE NEGATIVE    Comment: Performed at [facility], [address], [place], [place] [address]    Current Facility-Administered Medications  Medication Dose Route Frequency Provider Last Rate Last Admin  . benztropine (COGENTIN) tablet 1 mg  1 mg Oral QHS Duffy Bruce, MD   1 mg at 11/21/19 2234  . clonazePAM (KLONOPIN) tablet 0.5 mg  0.5 mg Oral BID PRN Duffy Bruce, MD      . fenofibrate tablet 160 mg  160 mg Oral Daily Duffy Bruce, MD   160 mg at 11/22/19 0926  . lithium carbonate capsule 300 mg  300 mg Oral Q12H Lu Duffel, RPH   300 mg at 11/22/19 9357  . pantoprazole (PROTONIX) EC tablet 40 mg  40 mg  Oral Daily Duffy Bruce, MD   40 mg at 11/22/19 0926  . traZODone (DESYREL) tablet 100 mg  100 mg Oral QHS Duffy Bruce, MD   100 mg at 11/21/19 2234   Current Outpatient Medications  Medication Sig Dispense Refill  . benztropine (COGENTIN) 1 MG tablet Take 1 mg by mouth at bedtime.    . clonazePAM (KLONOPIN) 0.5 MG tablet Take 1 tablet (0.5 mg total) by mouth 2 (two) times daily as needed (anxiety). 60 tablet 0  . cloZAPine (CLOZARIL) 100 MG tablet Take 400 mg by mouth at bedtime.  5  . dicyclomine (BENTYL) 10 MG capsule Take 1 capsule (10 mg total) by mouth 4 (four) times daily -  before meals and at bedtime. 120 capsule 0  . fenofibrate (TRICOR) 145 MG tablet Take 145 mg by mouth daily.    Marland Kitchen lithium carbonate 300 MG capsule Take 300 mg by mouth 2 (two) times daily.    Marland Kitchen omeprazole (PRILOSEC) 40 MG capsule Take 1 capsule (40 mg total) by mouth 2 (two) times daily before a meal. 60 capsule 2  . paliperidone (INVEGA SUSTENNA) 234 MG/1.5ML SUSY  injection Inject 234 mg into the muscle every 28 (twenty-eight) days. Next dose on 07/06/2018 (Patient taking differently: Inject 234 mg into the muscle every 28 (twenty-eight) days. ) 1.8 mL 1  . sulfamethoxazole-trimethoprim (BACTRIM DS) 800-160 MG tablet Take 1 tablet by mouth 2 (two) times daily. 20 tablet -  . traZODone (DESYREL) 100 MG tablet Take 100 mg by mouth at bedtime.      Musculoskeletal: Strength & Muscle Tone: within normal limits Gait & Station: normal Patient leans: N/A  Psychiatric Specialty Exam: Physical Exam  Nursing note and vitals reviewed. Constitutional: She appears well-developed and well-nourished.  Cardiovascular: Normal rate.  Respiratory: Effort normal.  Musculoskeletal:        General: Normal range of motion.     Cervical back: Normal range of motion and neck supple.  Neurological: She is alert.    Review of Systems  Blood pressure 115/63, pulse 85, temperature 98.3 F (36.8 C), temperature source Oral, resp. rate 20, height _0  (1.626 m), weight 96.2 kg, SpO2 97 %.Body mass index is 36.39 kg/m.  General Appearance: Casual  Eye Contact:  Good  Speech:  Clear and Coherent  Volume:  Normal  Mood:  NA  Affect:  Appropriate  Thought Process:  Coherent  Orientation:  Full (Time, Place, and Person)  Thought Content:  Logical and Hallucinations: Auditory Command:  Telling her to get into the oven  Suicidal Thoughts:  No  Homicidal Thoughts:  No  Memory:  Immediate;   Good Recent;   Good Remote;   Good  Judgement:  Fair  Insight:  Lacking  Psychomotor Activity:  Normal  Concentration:  Concentration: Good and Attention Span: Good  Recall:  Good  Fund of Knowledge:  Good  Language:  Good  Akathisia:  Negative  Handed:  Right  AIMS (if indicated):     Assets:  Desire for Improvement Leisure Time Resilience  ADL's:  Intact  Cognition:  WNL  Sleep:    Good  "   Treatment Plan Summary: Patient with group home and ACT team resources.  Willing to adjust medications on outpatient basis. Reports feeling safe returning home at this time. Denies SI or HI. Patient will be discharged back to the group home.   Diagnosis: Schizophrenia, discharge   Disposition: No evidence of imminent risk to self or others at present.  Patient does not meet criteria for psychiatric inpatient admission. Supportive therapy provided about ongoing stressors. Discussed crisis plan, support from social network, calling 911, coming to the Emergency Department, and calling Suicide Hotline.  Dixie Dials, MD 11/22/2019 11:47 AM

## 2019-11-22 NOTE — ED Notes (Signed)
Caregiver from group home signed DC signature on paper. This RN walked pt out to car. Ambulatory without distress.

## 2019-11-22 NOTE — ED Notes (Signed)
Breakfast tray provided to pt at this time.  

## 2019-11-22 NOTE — ED Notes (Signed)
This RN left a voicemail with both numbers for legal guardian, Caleen Essex. Then this RN called group home ([facility]) at [phone] to inform them of pt being DC. Spoke to [employer]. She stated she would call the owner of the facility and call this RN back about transportation.

## 2019-11-22 NOTE — ED Notes (Signed)
Jeanette from group home stated she would come pick pt up but she has to wait on someone else to come watch the group home. This RN asked Para March to call back when she is on her way. She verbalized that she would.

## 2019-11-28 ENCOUNTER — Telehealth: Payer: Self-pay

## 2019-11-28 NOTE — Telephone Encounter (Signed)
Pt called after hour nurse 11/25/19 11:56am wanting to know her pap smear results.  After hour nurse provided her c office hours. On 11/27/19 5:06pm I tried calling [phone] which rang and then sounded like a fax machine; Mobile # [phone] there was no answer.  On 11/28/19 8:55am there was no answer at [phone] which is the home number; [phone] again rang then sounded like a fax maching; was able to leave msg for Kimberly Boyle at [phone] to have pt to call me.  No DPR on file.  There is a legal guardian form stating Kimberly Boyle is her legal guardian.

## 2019-11-29 ENCOUNTER — Encounter: Payer: Self-pay | Admitting: Emergency Medicine

## 2019-11-29 ENCOUNTER — Emergency Department
Admission: EM | Admit: 2019-11-29 | Discharge: 2019-11-30 | Disposition: A | Discharge status: Other Acute Inpatient Hospital | Payer: Medicare Other | Attending: Emergency Medicine | Admitting: Emergency Medicine

## 2019-11-29 ENCOUNTER — Other Ambulatory Visit: Payer: Self-pay

## 2019-11-29 DIAGNOSIS — T1491XA Suicide attempt, initial encounter: Secondary | ICD-10-CM | POA: Diagnosis present

## 2019-11-29 DIAGNOSIS — F17210 Nicotine dependence, cigarettes, uncomplicated: Secondary | ICD-10-CM | POA: Insufficient documentation

## 2019-11-29 DIAGNOSIS — Z20822 Contact with and (suspected) exposure to covid-19: Secondary | ICD-10-CM | POA: Insufficient documentation

## 2019-11-29 DIAGNOSIS — L732 Hidradenitis suppurativa: Secondary | ICD-10-CM | POA: Diagnosis present

## 2019-11-29 DIAGNOSIS — S61519A Laceration without foreign body of unspecified wrist, initial encounter: Secondary | ICD-10-CM | POA: Diagnosis present

## 2019-11-29 DIAGNOSIS — F17200 Nicotine dependence, unspecified, uncomplicated: Secondary | ICD-10-CM | POA: Diagnosis present

## 2019-11-29 DIAGNOSIS — Z789 Other specified health status: Secondary | ICD-10-CM | POA: Diagnosis present

## 2019-11-29 DIAGNOSIS — F329 Major depressive disorder, single episode, unspecified: Secondary | ICD-10-CM | POA: Insufficient documentation

## 2019-11-29 DIAGNOSIS — N730 Acute parametritis and pelvic cellulitis: Secondary | ICD-10-CM | POA: Diagnosis present

## 2019-11-29 DIAGNOSIS — X789XXA Intentional self-harm by unspecified sharp object, initial encounter: Secondary | ICD-10-CM | POA: Diagnosis present

## 2019-11-29 DIAGNOSIS — F250 Schizoaffective disorder, bipolar type: Secondary | ICD-10-CM | POA: Diagnosis present

## 2019-11-29 DIAGNOSIS — F411 Generalized anxiety disorder: Secondary | ICD-10-CM | POA: Diagnosis present

## 2019-11-29 DIAGNOSIS — F29 Unspecified psychosis not due to a substance or known physiological condition: Secondary | ICD-10-CM | POA: Diagnosis present

## 2019-11-29 DIAGNOSIS — K30 Functional dyspepsia: Secondary | ICD-10-CM | POA: Diagnosis present

## 2019-11-29 DIAGNOSIS — T391X1A Poisoning by 4-Aminophenol derivatives, accidental (unintentional), initial encounter: Secondary | ICD-10-CM | POA: Diagnosis present

## 2019-11-29 DIAGNOSIS — R45851 Suicidal ideations: Secondary | ICD-10-CM

## 2019-11-29 DIAGNOSIS — F1220 Cannabis dependence, uncomplicated: Secondary | ICD-10-CM | POA: Diagnosis present

## 2019-11-29 DIAGNOSIS — F32A Depression, unspecified: Secondary | ICD-10-CM | POA: Diagnosis present

## 2019-11-29 LAB — CBC
HCT: 37 % (ref 36.0–46.0)
Hemoglobin: 12 g/dL (ref 12.0–15.0)
MCH: 29.2 pg (ref 26.0–34.0)
MCHC: 32.4 g/dL (ref 30.0–36.0)
MCV: 90 fL (ref 80.0–100.0)
Platelets: 420 10*3/uL — ABNORMAL HIGH (ref 150–400)
RBC: 4.11 MIL/uL (ref 3.87–5.11)
RDW: 13 % (ref 11.5–15.5)
WBC: 9.4 10*3/uL (ref 4.0–10.5)
nRBC: 0 % (ref 0.0–0.2)

## 2019-11-29 LAB — URINE DRUG SCREEN, QUALITATIVE (ARMC ONLY)
Amphetamines, Ur Screen: NOT DETECTED
Barbiturates, Ur Screen: NOT DETECTED
Benzodiazepine, Ur Scrn: NOT DETECTED
Cannabinoid 50 Ng, Ur ~~LOC~~: NOT DETECTED
Cocaine Metabolite,Ur ~~LOC~~: NOT DETECTED
MDMA (Ecstasy)Ur Screen: POSITIVE — AB
Methadone Scn, Ur: NOT DETECTED
Opiate, Ur Screen: NOT DETECTED
Phencyclidine (PCP) Ur S: NOT DETECTED
Tricyclic, Ur Screen: NOT DETECTED

## 2019-11-29 LAB — COMPREHENSIVE METABOLIC PANEL
ALT: 24 U/L (ref 0–44)
AST: 22 U/L (ref 15–41)
Albumin: 4 g/dL (ref 3.5–5.0)
Alkaline Phosphatase: 73 U/L (ref 38–126)
Anion gap: 7 (ref 5–15)
BUN: 12 mg/dL (ref 6–20)
CO2: 22 mmol/L (ref 22–32)
Calcium: 9 mg/dL (ref 8.9–10.3)
Chloride: 107 mmol/L (ref 98–111)
Creatinine, Ser: 0.78 mg/dL (ref 0.44–1.00)
GFR calc Af Amer: >60 mL/min (ref >60)
GFR calc non Af Amer: >60 mL/min (ref >60)
Glucose, Bld: 88 mg/dL (ref 70–99)
Potassium: 3.7 mmol/L (ref 3.5–5.1)
Sodium: 136 mmol/L (ref 135–145)
Total Bilirubin: 0.4 mg/dL (ref 0.3–1.2)
Total Protein: 6.6 g/dL (ref 6.5–8.1)

## 2019-11-29 LAB — SALICYLATE LEVEL: Salicylate Lvl: <7 mg/dL — ABNORMAL LOW (ref 7.0–30.0)

## 2019-11-29 LAB — POCT PREGNANCY, URINE: Preg Test, Ur: NEGATIVE

## 2019-11-29 LAB — ETHANOL: Alcohol, Ethyl (B): <10 mg/dL (ref <10)

## 2019-11-29 LAB — ACETAMINOPHEN LEVEL: Acetaminophen (Tylenol), Serum: <10 ug/mL — ABNORMAL LOW (ref 10–30)

## 2019-11-29 NOTE — ED Provider Notes (Signed)
[facility] Emergency Department Provider Note       Time seen: ----------------------------------------- 8:34 PM on 11/29/2019 -----------------------------------------   I have reviewed the triage vital signs and the nursing notes.  HISTORY   Chief Complaint No chief complaint on file.    HPI Kimberly Boyle is a 24 y.o. female with a history of anxiety, arthritis, bipolar disorder, depression, PID, schizophrenia who presents to the ED for suicidal thoughts.  Patient states she was going to cut herself when her caregiver went to sleep.  Past Medical History:  Diagnosis Date  . Anxiety   . Arthritis   . Bipolar disorder (HCC)   . Depression   . Hidradenitis suppurativa 06/22/2019   Groin   . Major depressive disorder   . Panic disorder   . PID (acute pelvic inflammatory disease) 06/22/2019   06/22/19   . Poor historian 06/22/2019  . Schizophrenia [facility])     Patient Active Problem List   Diagnosis Date Noted  . Abnormal CT scan, colon   . Non-ulcer dyspepsia   . Hidradenitis suppurativa 06/22/2019  . Poor historian 06/22/2019  . PID (acute pelvic inflammatory disease) 06/22/2019  . Psychosis (HCC) 05/25/2019  . Suicidal ideation 07/20/2018  . Tobacco use disorder 05/08/2018  . Schizoaffective disorder, bipolar type (HCC) 05/06/2018  . Suicide attempt (HCC) 08/20/2017  . Cannabis use disorder, moderate, dependence (HCC) 08/20/2017  . Self-inflicted laceration of wrist (HCC) 08/20/2017  . Acetaminophen poisoning 08/20/2017  . Generalized anxiety disorder 01/05/2017  . Depression 01/05/2017    Past Surgical History:  Procedure Laterality Date  . COLONOSCOPY WITH PROPOFOL N/A 09/29/2019   Procedure: COLONOSCOPY WITH PROPOFOL;  Surgeon: Toney Reil, MD;  Location: [facility] ENDOSCOPY;  Service: Gastroenterology;  Laterality: N/A;  . ESOPHAGOGASTRODUODENOSCOPY (EGD) WITH PROPOFOL N/A 09/29/2019   Procedure: ESOPHAGOGASTRODUODENOSCOPY  (EGD) WITH PROPOFOL;  Surgeon: Toney Reil, MD;  Location: [facility] ENDOSCOPY;  Service: Gastroenterology;  Laterality: N/A;    Allergies Penicillins  Social History Social History   Tobacco Use  . Smoking status: Current Every Day Smoker    Packs/day: 0.50    Years: 11.00    Pack years: 5.50    Types: Cigarettes  . Smokeless tobacco: Never Used  Substance Use Topics  . Alcohol use: No  . Drug use: Not Currently    Types: Marijuana    Review of Systems Constitutional: Negative for fever. Cardiovascular: Negative for chest pain. Respiratory: Negative for shortness of breath. Gastrointestinal: Negative for abdominal pain, vomiting and diarrhea. Musculoskeletal: Negative for back pain. Skin: Negative for rash. Neurological: Negative for headaches, focal weakness or numbness. Psychiatric: Positive for suicidal ideation  All systems negative/normal/unremarkable except as stated in the HPI  ____________________________________________   PHYSICAL EXAM:  VITAL SIGNS: ED Triage Vitals  Enc Vitals Group     BP 11/29/19 1921 126/61     Pulse Rate 11/29/19 1921 89     Resp 11/29/19 1921 18     Temp 11/29/19 1921 98 F (36.7 C)     Temp Source 11/29/19 1921 Oral     SpO2 11/29/19 1921 98 %     Weight 11/29/19 1922 213 lb (96.6 kg)     Height 11/29/19 1922 5\' 4"  (1.626 m)     Head Circumference --      Peak Flow --      Pain Score 11/29/19 1922 0     Pain Loc --      Pain Edu? --  Excl. in [place]? --     Constitutional: Alert and oriented. Well appearing and in no distress. Eyes: Conjunctivae are normal. Normal extraocular movements. Cardiovascular: Normal rate, regular rhythm. No murmurs, rubs, or gallops. Respiratory: Normal respiratory effort without tachypnea nor retractions. Breath sounds are clear and equal bilaterally. No wheezes/rales/rhonchi. Gastrointestinal: Soft and nontender. Normal bowel sounds Musculoskeletal: Nontender with normal range of motion  in extremities. No lower extremity tenderness nor edema. Neurologic:  Normal speech and language. No gross focal neurologic deficits are appreciated.  Skin:  Skin is warm, dry and intact. No rash noted. Psychiatric: Mood and affect are normal. Speech and behavior are normal.  ____________________________________________  ED COURSE:  As part of my medical decision making, I reviewed the following data within the [place] History obtained from family if available, nursing notes, old chart and ekg, as well as notes from prior ED visits. Patient presented for suicidal ideation, we will assess with labs and imaging as indicated at this time.   Procedures  Kimberly Boyle was evaluated in Emergency Department on 11/29/2019 for the symptoms described in the history of present illness. She was evaluated in the context of the global COVID-19 pandemic, which necessitated consideration that the patient might be at risk for infection with the SARS-CoV-2 virus that causes COVID-19. Institutional protocols and algorithms that pertain to the evaluation of patients at risk for COVID-19 are in a state of rapid change based on information released by regulatory bodies including the CDC and federal and state organizations. These policies and algorithms were followed during the patient's care in the ED.  ____________________________________________   LABS (pertinent positives/negatives)  Labs Reviewed  SALICYLATE LEVEL - Abnormal; Notable for the following components:      Result Value   Salicylate Lvl <7.8 (*)    All other components within normal limits  ACETAMINOPHEN LEVEL - Abnormal; Notable for the following components:   Acetaminophen (Tylenol), Serum <10 (*)    All other components within normal limits  CBC - Abnormal; Notable for the following components:   Platelets 420 (*)    All other components within normal limits  URINE DRUG SCREEN, QUALITATIVE ([facility] ONLY) - Abnormal;  Notable for the following components:   MDMA (Ecstasy)Ur Screen POSITIVE (*)    All other components within normal limits  COMPREHENSIVE METABOLIC PANEL  ETHANOL  POC URINE PREG, ED  POCT PREGNANCY, URINE   ____________________________________________   DIFFERENTIAL DIAGNOSIS   Depression, suicidal ideation, medication noncompliance, bipolar disorder  FINAL ASSESSMENT AND PLAN  Suicidal ideation   Plan: The patient had presented for suicidal ideation. Patient's labs are unremarkable with the exception of ecstasy and her tox screen.  She is medically clear for psychiatric evaluation and disposition.   Laurence Aly, MD    Note: This note was generated in part or whole with voice recognition software. Voice recognition is usually quite accurate but there are transcription errors that can and very often do occur. I apologize for any typographical errors that were not detected and corrected.     Earleen Newport, MD 11/29/19 2035

## 2019-11-29 NOTE — ED Notes (Signed)
With EDT Mayra and this nurse present, pt removes black tennis shoes, black leggings, green long sleeve shirt, black jacket, grey tobagan, silver tone ring with blue stone, silver tone ring with clear stone, silver tone band, and silver tone ring, green bra, black panties, and tote bag--all placed in labeled pt belonging bag to be secured on nursing unit; pt to keep own glasses

## 2019-11-29 NOTE — ED Notes (Signed)
Pt. Alert and oriented, warm and dry, in no distress. Pt. Denies HI, and VH. Patient states having SI with plan to cut herself with kitchen knife when her caregiver went home. Patient states hearing voices telling her to do it and someone is out to get here. Patient contracts for safety with this Clinical research associate. Pt. Encouraged to let nursing staff know of any concerns or needs.

## 2019-11-29 NOTE — ED Triage Notes (Signed)
Patient ambulatory to triage with steady gait, without difficulty or distress noted, mask in place; pt reports suicidal thoughts "I was gonna cut myself when my caregiver went to sleep"

## 2019-11-30 ENCOUNTER — Inpatient Hospital Stay
Admission: RE | Admit: 2019-11-30 | Discharge: 2019-12-06 | DRG: 885 | Disposition: A | Discharge status: Home / Self Care | Payer: Medicare Other | Source: Intra-hospital | Attending: Psychiatry | Admitting: Psychiatry

## 2019-11-30 ENCOUNTER — Other Ambulatory Visit: Payer: Self-pay

## 2019-11-30 ENCOUNTER — Encounter: Payer: Self-pay | Admitting: Psychiatry

## 2019-11-30 DIAGNOSIS — R030 Elevated blood-pressure reading, without diagnosis of hypertension: Secondary | ICD-10-CM | POA: Diagnosis present

## 2019-11-30 DIAGNOSIS — Z818 Family history of other mental and behavioral disorders: Secondary | ICD-10-CM | POA: Diagnosis not present

## 2019-11-30 DIAGNOSIS — F29 Unspecified psychosis not due to a substance or known physiological condition: Secondary | ICD-10-CM | POA: Diagnosis present

## 2019-11-30 DIAGNOSIS — E669 Obesity, unspecified: Secondary | ICD-10-CM | POA: Diagnosis present

## 2019-11-30 DIAGNOSIS — R45851 Suicidal ideations: Secondary | ICD-10-CM | POA: Diagnosis present

## 2019-11-30 DIAGNOSIS — Z5181 Encounter for therapeutic drug level monitoring: Secondary | ICD-10-CM | POA: Diagnosis not present

## 2019-11-30 DIAGNOSIS — Z6836 Body mass index (BMI) 36.0-36.9, adult: Secondary | ICD-10-CM | POA: Diagnosis not present

## 2019-11-30 DIAGNOSIS — F17210 Nicotine dependence, cigarettes, uncomplicated: Secondary | ICD-10-CM | POA: Diagnosis present

## 2019-11-30 DIAGNOSIS — F250 Schizoaffective disorder, bipolar type: Secondary | ICD-10-CM | POA: Diagnosis not present

## 2019-11-30 DIAGNOSIS — F259 Schizoaffective disorder, unspecified: Principal | ICD-10-CM | POA: Diagnosis present

## 2019-11-30 DIAGNOSIS — E781 Pure hyperglyceridemia: Secondary | ICD-10-CM | POA: Diagnosis present

## 2019-11-30 DIAGNOSIS — F251 Schizoaffective disorder, depressive type: Secondary | ICD-10-CM | POA: Diagnosis not present

## 2019-11-30 DIAGNOSIS — E221 Hyperprolactinemia: Secondary | ICD-10-CM | POA: Diagnosis present

## 2019-11-30 DIAGNOSIS — Z880 Allergy status to penicillin: Secondary | ICD-10-CM

## 2019-11-30 DIAGNOSIS — K5900 Constipation, unspecified: Secondary | ICD-10-CM | POA: Diagnosis present

## 2019-11-30 DIAGNOSIS — F419 Anxiety disorder, unspecified: Secondary | ICD-10-CM | POA: Diagnosis present

## 2019-11-30 LAB — CBC WITH DIFFERENTIAL/PLATELET
Abs Immature Granulocytes: 0.04 10*3/uL (ref 0.00–0.07)
Basophils Absolute: 0 10*3/uL (ref 0.0–0.1)
Basophils Relative: 0 %
Eosinophils Absolute: 0 10*3/uL (ref 0.0–0.5)
Eosinophils Relative: 0 %
HCT: 38.4 % (ref 36.0–46.0)
Hemoglobin: 12.3 g/dL (ref 12.0–15.0)
Immature Granulocytes: 0 %
Lymphocytes Relative: 28 %
Lymphs Abs: 2.6 10*3/uL (ref 0.7–4.0)
MCH: 29 pg (ref 26.0–34.0)
MCHC: 32 g/dL (ref 30.0–36.0)
MCV: 90.6 fL (ref 80.0–100.0)
Monocytes Absolute: 0.8 10*3/uL (ref 0.1–1.0)
Monocytes Relative: 9 %
Neutro Abs: 5.9 10*3/uL (ref 1.7–7.7)
Neutrophils Relative %: 63 %
Platelets: 471 10*3/uL — ABNORMAL HIGH (ref 150–400)
RBC: 4.24 MIL/uL (ref 3.87–5.11)
RDW: 13 % (ref 11.5–15.5)
WBC: 9.4 10*3/uL (ref 4.0–10.5)
nRBC: 0 % (ref 0.0–0.2)

## 2019-11-30 LAB — SARS CORONAVIRUS 2 (TAT 6-24 HRS): SARS Coronavirus 2: NEGATIVE

## 2019-11-30 MED ORDER — ALUM & MAG HYDROXIDE-SIMETH 200-200-20 MG/5ML PO SUSP
30.0000 mL | ORAL | Status: DC | PRN
  Administered 2019-12-02 – 2019-12-06 (×3): 30 mL via ORAL
  Filled 2019-11-30 (×2): qty 30

## 2019-11-30 MED ORDER — MAGNESIUM HYDROXIDE 400 MG/5ML PO SUSP: 30.0000 mL | Freq: Every day | ORAL | Status: DC | PRN

## 2019-11-30 MED ORDER — DICYCLOMINE HCL 10 MG PO CAPS
10.0000 mg | ORAL_CAPSULE | Freq: Three times a day (TID) | ORAL | Status: DC
  Administered 2019-11-30 – 2019-12-01 (×2): 10 mg via ORAL
  Filled 2019-11-30 (×3): qty 1

## 2019-11-30 MED ORDER — BENZTROPINE MESYLATE 1 MG PO TABS
1.0000 mg | ORAL_TABLET | Freq: Every day | ORAL | Status: DC
  Administered 2019-11-30: 21:00:00 1 mg via ORAL
  Filled 2019-11-30: qty 1

## 2019-11-30 MED ORDER — LITHIUM CARBONATE 300 MG PO CAPS
300.0000 mg | ORAL_CAPSULE | Freq: Two times a day (BID) | ORAL | Status: DC
  Administered 2019-11-30 – 2019-12-01 (×2): 300 mg via ORAL
  Filled 2019-11-30 (×2): qty 1

## 2019-11-30 MED ORDER — CLONAZEPAM 0.5 MG PO TABS
0.5000 mg | ORAL_TABLET | Freq: Two times a day (BID) | ORAL | Status: DC | PRN
  Administered 2019-11-30 – 2019-12-03 (×5): 0.5 mg via ORAL
  Filled 2019-11-30 (×5): qty 1

## 2019-11-30 MED ORDER — CLOZAPINE 100 MG PO TABS
400.0000 mg | ORAL_TABLET | Freq: Every day | ORAL | Status: DC
  Administered 2019-11-30 – 2019-12-05 (×6): 400 mg via ORAL
  Filled 2019-11-30 (×8): qty 4

## 2019-11-30 NOTE — Consult Note (Signed)
[facility] Face-to-Face Psychiatry Consult   Reason for Consult: Suicidal ideation Referring Physician: Dr. Mayford Knife Patient Identification: Kimberly Boyle MRN:  [id number] Principal Diagnosis: Suicidal ideation Diagnosis:  Principal Problem:   Suicidal ideation Active Problems:   Generalized anxiety disorder   Depression   Suicide attempt (HCC)   Cannabis use disorder, moderate, dependence (HCC)   Self-inflicted laceration of wrist (HCC)   Acetaminophen poisoning   Schizoaffective disorder, bipolar type (HCC)   Tobacco use disorder   Psychosis (HCC)   Hidradenitis suppurativa   Poor historian   PID (acute pelvic inflammatory disease)   Non-ulcer dyspepsia   Total Time spent with patient: 30 minutes  Subjective: "I was gonna cut myself when my caregiver went to sleep" Kimberly Boyle is a 24 y.o. female patient presented to [facility] ED via POV with group home staff with the patient. The patient resides at [facility]. The patient's mother passed away in 09-27-18).  This is a patient with multiple ER visits due to her attempting to self-injured and voicing suicide ideation.  On this visit, she expressed that she would hurt herself when her caregiver goes to bed.  "I changed my mind and told her what I was planning to do."  The patient was seen face-to-face by this provider; chart reviewed and consulted with Dr. Mayford Knife on 11/29/2019 due to the patient's care. It was discussed with the EDP that the patient would remain under observation overnight and reassess in the a.m. to determine if she meets the criteria for psychiatric inpatient admission or could be discharged back to her group home. The patient is alert and oriented x 4, calm, cooperative, and mood-congruent with affect on evaluation. The patient does not appear to be responding to internal or external stimuli. Neither is the patient presenting with any delusional thinking. The patient denies auditory and visual  hallucinations. The patient admits to suicidal ideations but denies homicidal or self-harm ideations. The patient is not presenting with any psychotic or paranoid behaviors. During an encounter with the patient, she was able to answer questions appropriately.  Plan: The patient will remain under observation overnight and reassess in the a.m. to determine if she meets criteria for psychiatric inpatient admission or could be discharged back to her group home. HPI: Per Dr. Mayford Knife: Kimberly Boyle is a 24 y.o. female with a history of anxiety, arthritis, bipolar disorder, depression, PID, schizophrenia who presents to the ED for suicidal thoughts.  Patient states she was going to cut herself when her caregiver went to sleep.  Past Psychiatric History:  Anxiety Bipolar disorder (HCC) Depression Major depressive disorder Panic disorder Schizophrenia (HCC)  Risk to Self:   No Risk to Others:  No Prior Inpatient Therapy:  Yes Prior Outpatient Therapy:  Yes  Past Medical History:  Past Medical History:  Diagnosis Date  . Anxiety   . Arthritis   . Bipolar disorder (HCC)   . Depression   . Hidradenitis suppurativa 06/22/2019   Groin   . Major depressive disorder   . Panic disorder   . PID (acute pelvic inflammatory disease) 06/22/2019   06/22/19   . Poor historian 06/22/2019  . Schizophrenia [facility])     Past Surgical History:  Procedure Laterality Date  . COLONOSCOPY WITH PROPOFOL N/A 09/29/2019   Procedure: COLONOSCOPY WITH PROPOFOL;  Surgeon: Toney Reil, MD;  Location: [facility] ENDOSCOPY;  Service: Gastroenterology;  Laterality: N/A;  . ESOPHAGOGASTRODUODENOSCOPY (EGD) WITH PROPOFOL N/A 09/29/2019   Procedure: ESOPHAGOGASTRODUODENOSCOPY (EGD) WITH  PROPOFOL;  Surgeon: Lin Landsman, MD;  Location: [facility] ENDOSCOPY;  Service: Gastroenterology;  Laterality: N/A;   Family History:  Family History  Problem Relation Age of Onset  . Post-traumatic stress disorder Mother   .  Depression Mother   . Depression Maternal Uncle   . Cancer Maternal Grandmother    Family Psychiatric  History:  Social History:  Social History   Substance and Sexual Activity  Alcohol Use No     Social History   Substance and Sexual Activity  Drug Use Not Currently  . Types: Marijuana    Social History   Socioeconomic History  . Marital status: Single    Spouse name: Not on file  . Number of children: 0  . Years of education: Not on file  . Highest education level: High school graduate  Occupational History    Comment: unemployed  Tobacco Use  . Smoking status: Current Every Day Smoker    Packs/day: 0.50    Years: 11.00    Pack years: 5.50    Types: Cigarettes  . Smokeless tobacco: Never Used  Substance and Sexual Activity  . Alcohol use: No  . Drug use: Not Currently    Types: Marijuana  . Sexual activity: Not Currently  Other Topics Concern  . Not on file  Social History Narrative   ** Merged History Encounter **       Social Determinants of Health   Financial Resource Strain:   . Difficulty of Paying Living Expenses: Not on file  Food Insecurity:   . Worried About Charity fundraiser in the Last Year: Not on file  . Ran Out of Food in the Last Year: Not on file  Transportation Needs:   . Lack of Transportation (Medical): Not on file  . Lack of Transportation (Non-Medical): Not on file  Physical Activity:   . Days of Exercise per Week: Not on file  . Minutes of Exercise per Session: Not on file  Stress:   . Feeling of Stress : Not on file  Social Connections:   . Frequency of Communication with Friends and Family: Not on file  . Frequency of Social Gatherings with Friends and Family: Not on file  . Attends Religious Services: Not on file  . Active Member of Clubs or Organizations: Not on file  . Attends Archivist Meetings: Not on file  . Marital Status: Not on file   Additional Social History:    Allergies:   Allergies  Allergen  Reactions  . Penicillins Diarrhea and Nausea Only    Has patient had a PCN reaction causing immediate rash, facial/tongue/throat swelling, SOB or lightheadedness with hypotension:  no Has patient had a PCN reaction causing severe rash involving mucus membranes or skin necrosis:  no Has patient had a PCN reaction that required hospitalization: no Has patient had a PCN reaction occurring within the last 10 years: no If all of the above answers are "NO", then may proceed with Cephalosporin use.     Labs:  Results for orders placed or performed during the hospital encounter of 11/29/19 (from the past 48 hour(s))  Comprehensive metabolic panel     Status: None   Collection Time: 11/29/19  7:24 PM  Result Value Ref Range   Sodium 136 135 - 145 mmol/L   Potassium 3.7 3.5 - 5.1 mmol/L   Chloride 107 98 - 111 mmol/L   CO2 22 22 - 32 mmol/L   Glucose, Bld 88 70 - 99  mg/dL   BUN 12 6 - 20 mg/dL   Creatinine, Ser 3.78 0.44 - 1.00 mg/dL   Calcium 9.0 8.9 - 58.8 mg/dL   Total Protein 6.6 6.5 - 8.1 g/dL   Albumin 4.0 3.5 - 5.0 g/dL   AST 22 15 - 41 U/L   ALT 24 0 - 44 U/L   Alkaline Phosphatase 73 38 - 126 U/L   Total Bilirubin 0.4 0.3 - 1.2 mg/dL   GFR calc non Af Amer >60 >60 mL/min   GFR calc Af Amer >60 >60 mL/min   Anion gap 7 5 - 15    Comment: Performed at [facility], [address] [place], [place] [address]  Ethanol     Status: None   Collection Time: 11/29/19  7:24 PM  Result Value Ref Range   Alcohol, Ethyl (B) <10 <10 mg/dL    Comment: (NOTE) Lowest detectable limit for serum alcohol is 10 mg/dL. For medical purposes only. Performed at [facility], [address] [place], [place] [address]   Salicylate level     Status: Abnormal   Collection Time: 11/29/19  7:24 PM  Result Value Ref Range   Salicylate Lvl <7.0 (L) 7.0 - 30.0 mg/dL    Comment: Performed at [facility], [address] [place], [place] [address]  Acetaminophen level      Status: Abnormal   Collection Time: 11/29/19  7:24 PM  Result Value Ref Range   Acetaminophen (Tylenol), Serum <10 (L) 10 - 30 ug/mL    Comment: (NOTE) Therapeutic concentrations vary significantly. A range of 10-30 ug/mL  may be an effective concentration for many patients. However, some  are best treated at concentrations outside of this range. Acetaminophen concentrations >150 ug/mL at 4 hours after ingestion  and >50 ug/mL at 12 hours after ingestion are often associated with  toxic reactions. Performed at [facility], [address] [place], [place] [address]   cbc     Status: Abnormal   Collection Time: 11/29/19  7:24 PM  Result Value Ref Range   WBC 9.4 4.0 - 10.5 K/uL   RBC 4.11 3.87 - 5.11 MIL/uL   Hemoglobin 12.0 12.0 - 15.0 g/dL   HCT 09.6 28.3 - 66.2 %   MCV 90.0 80.0 - 100.0 fL   MCH 29.2 26.0 - 34.0 pg   MCHC 32.4 30.0 - 36.0 g/dL   RDW 94.7 65.4 - 65.0 %   Platelets 420 (H) 150 - 400 K/uL   nRBC 0.0 0.0 - 0.2 %    Comment: Performed at [facility], [address] [place], [place] [address]  Urine Drug Screen, Qualitative     Status: Abnormal   Collection Time: 11/29/19  7:24 PM  Result Value Ref Range   Tricyclic, Ur Screen NONE DETECTED NONE DETECTED   Amphetamines, Ur Screen NONE DETECTED NONE DETECTED   MDMA (Ecstasy)Ur Screen POSITIVE (A) NONE DETECTED   Cocaine Metabolite,Ur [place] NONE DETECTED NONE DETECTED   Opiate, Ur Screen NONE DETECTED NONE DETECTED   Phencyclidine (PCP) Ur S NONE DETECTED NONE DETECTED   Cannabinoid 50 Ng, Ur [place] NONE DETECTED NONE DETECTED   Barbiturates, Ur Screen NONE DETECTED NONE DETECTED   Benzodiazepine, Ur Scrn NONE DETECTED NONE DETECTED   Methadone Scn, Ur NONE DETECTED NONE DETECTED    Comment: (NOTE) Tricyclics + metabolites, urine    Cutoff 1000 ng/mL Amphetamines + metabolites, urine  Cutoff 1000 ng/mL MDMA (Ecstasy), urine  Cutoff 500 ng/mL Cocaine Metabolite, urine          Cutoff  300 ng/mL Opiate + metabolites, urine        Cutoff 300 ng/mL Phencyclidine (PCP), urine         Cutoff 25 ng/mL Cannabinoid, urine                 Cutoff 50 ng/mL Barbiturates + metabolites, urine  Cutoff 200 ng/mL Benzodiazepine, urine              Cutoff 200 ng/mL Methadone, urine                   Cutoff 300 ng/mL The urine drug screen provides only a preliminary, unconfirmed analytical test result and should not be used for non-medical purposes. Clinical consideration and professional judgment should be applied to any positive drug screen result due to possible interfering substances. A more specific alternate chemical method must be used in order to obtain a confirmed analytical result. Gas chromatography / mass spectrometry (GC/MS) is the preferred confirmat ory method. Performed at [facility], [address] [place], [place] [address]   Pregnancy, urine POC     Status: None   Collection Time: 11/29/19  7:52 PM  Result Value Ref Range   Preg Test, Ur NEGATIVE NEGATIVE    Comment:        THE SENSITIVITY OF THIS METHODOLOGY IS >24 mIU/mL     No current facility-administered medications for this encounter.   Current Outpatient Medications  Medication Sig Dispense Refill  . benztropine (COGENTIN) 1 MG tablet Take 1 mg by mouth at bedtime.    . clonazePAM (KLONOPIN) 0.5 MG tablet Take 1 tablet (0.5 mg total) by mouth 2 (two) times daily as needed (anxiety). 60 tablet 0  . cloZAPine (CLOZARIL) 100 MG tablet Take 400 mg by mouth at bedtime.  5  . dicyclomine (BENTYL) 10 MG capsule Take 1 capsule (10 mg total) by mouth 4 (four) times daily -  before meals and at bedtime. 120 capsule 0  . fenofibrate (TRICOR) 145 MG tablet Take 145 mg by mouth daily.    [place] Kitchen lithium carbonate 300 MG capsule Take 300 mg by mouth 2 (two) times daily.    Mar[place] Kitchen omeprazole (PRILOSEC) 40 MG capsule Take 1 capsule (40 mg total) by mouth 2 (two) times daily before a meal. 60 capsule 2  .  paliperidone (INVEGA SUSTENNA) 234 MG/1.5ML SUSY injection Inject 234 mg into the muscle every 28 (twenty-eight) days. Next dose on 07/06/2018 (Patient taking differently: Inject 234 mg into the muscle every 28 (twenty-eight) days. ) 1.8 mL 1  . sulfamethoxazole-trimethoprim (BACTRIM DS) 800-160 MG tablet Take 1 tablet by mouth 2 (two) times daily. 20 tablet -  . traZODone (DESYREL) 100 MG tablet Take 100 mg by mouth at bedtime.      Musculoskeletal: Strength & Muscle Tone: within normal limits Gait & Station: normal Patient leans: N/A  Psychiatric Specialty Exam: Physical Exam  Nursing note and vitals reviewed. Constitutional: She appears well-developed and well-nourished.  Cardiovascular: Normal rate.  Respiratory: Effort normal.  Musculoskeletal:        General: Normal range of motion.     Cervical back: Normal range of motion and neck supple.  Neurological: She is alert.  Psychiatric: She has a normal mood and affect. Her behavior is normal.    Review of Systems  Psychiatric/Behavioral: The patient is nervous/anxious.   All other systems reviewed and are negative.  Blood pressure 126/61, pulse 89, temperature 98 F (36.7 C), temperature source Oral, resp. rate 18, height 5\' 4"  (1.626 m), weight 96.6 kg, last menstrual period 11/22/2019, SpO2 98 %.Body mass index is 36.56 kg/m.  General Appearance: Casual  Eye Contact:  Good  Speech:  Clear and Coherent  Volume:  Normal  Mood:  NA  Affect:  Appropriate  Thought Process:  Coherent  Orientation:  Full (Time, Place, and Person)  Thought Content:  Logical and Hallucinations: Auditory Command:  Telling her to get into the oven  Suicidal Thoughts:  Yes.  without intent/plan  Homicidal Thoughts:  No  Memory:  Immediate;   Good Recent;   Good Remote;   Good  Judgement:  Fair  Insight:  Lacking  Psychomotor Activity:  Normal  Concentration:  Concentration: Good and Attention Span: Good  Recall:  Good  Fund of Knowledge:   Good  Language:  Good  Akathisia:  Negative  Handed:  Right  AIMS (if indicated):     Assets:  Desire for Improvement Leisure Time Resilience  ADL's:  Intact  Cognition:  WNL  Sleep:    Good     Treatment Plan Summary: Daily contact with patient to assess and evaluate symptoms and progress in treatment, Medication management and Plan The patient will remain under observation overnight and reassess in the a.m. to determine if she meets criteria for psychiatric inpatient admission or could be discharged back to her group home  Disposition: Supportive therapy provided about ongoing stressors. The patient will remain under observation overnight and reassess in the a.m. to determine if she meets criteria for psychiatric inpatient admission or could be discharged back to her group home.  01/20/2020, NP 11/30/2019 3:17 AM

## 2019-11-30 NOTE — ED Notes (Signed)
Assumed care of patient this morning, patient laying in bed with covers over head, responds to name. Reports no ideations this morning for SI. Denies HV/Si. Awaiting placement. Safety maintained. Will monitor.

## 2019-11-30 NOTE — ED Notes (Signed)
Patient up out bed requesting something to eat. Lunch box provided. Went back to her room ate and went back under the covers.

## 2019-11-30 NOTE — Progress Notes (Signed)
CLOZAPINE MONITORING (reflects NEW REMS GUIDELINES EFFECTIVE 07/23/2014):  Check ANC at least weekly while inpatient.  For general population patients, i.e., those without benign ethnic neutropenia (BEN): --If ANC 1000-1499, increase ANC monitoring to 3x/wk --If ANC < 1000, HOLD CLOZAPINE and get psych consult   For patients with BEN: --If ANC 500-999, increase ANC monitoring to 3x/wk --If ANC < 500, HOLD CLOZAPINE and get psych consult  REMS-certified psychiatry provider can continue drug with ANC below cited thresholds if they document medical opinion that the neutropenia is not clozapine-induced (heme consult is recommended) or that risk of interrupting therapy is greater than the risk of developing severe neutropenia.  --SEE PRESCRIBING INFORMATION FOR ADDITIONAL DETAILS  2/18:  ANC = 5900.  Pt is registered with Clozaril REMS program and eligible to receive clozaril.  Will recheck ANC in 1 week on 2/25.

## 2019-11-30 NOTE — ED Notes (Signed)
Pt. Introduced to unit.  Pt. Responded by saying she has been in BHU unit before.  Pt. Calm and cooperative.  Pt. Requested and was given meal tray, drink and tv remote.  Pt. States she will come to this nurse for any concerns or questions tonight.

## 2019-11-30 NOTE — BH Assessment (Signed)
Patient is to be admitted to [facility] BMU by Dr. Harvin Hazel.  Attending Physician will be Dr. Toni Amend.   Patient has been assigned to room 315, by [facility] Charge Nurse [place] F.   Intake Paper Work has been signed and placed on patient chart.  ER staff is aware of the admission:  Dr. Shon Hale, ER MD   Lattie Corns Patient's Nurse   Ethelene Browns, Patient Access  Writer was unable to contact the patient's [facility] DSS Case Worker Meredith Staggers). Her voicemail was full and was unable to leave a HIPPA complaint message.  Writer received verbal consent for admission, from the on-call [facility] DSS (Racheal Wilson-[phone]). She states, she can be contacted for the night, while she's on call, for any medication changes or changes in plans.

## 2019-11-30 NOTE — ED Provider Notes (Signed)
-----------------------------------------   7:29 AM on 11/30/2019 -----------------------------------------   Blood pressure 126/61, pulse 89, temperature 98 F (36.7 C), temperature source Oral, resp. rate 18, height 5\' 4"  (1.626 m), weight 96.6 kg, last menstrual period 11/22/2019, SpO2 98 %.  The patient is calm and cooperative at this time.  There have been no acute events since the last update.  Awaiting disposition plan from Behavioral Medicine and/or Social Work team(s).    01/20/2020, MD 11/30/19 [phone]

## 2019-11-30 NOTE — ED Notes (Signed)
Patient requested shower, ADL supplies obtained, patient went into shower room and came right back out "stating she changed her mind she feeling a little lazy and might shower later"

## 2019-11-30 NOTE — Consult Note (Signed)
[facility] Face-to-Face Psychiatry Consult   Reason for Consult: Suicidal ideation Referring Physician: Dr. Jimmye Norman Patient Identification: Kimberly Boyle MRN:  [id number] Principal Diagnosis: Suicidal ideation Diagnosis:  Principal Problem:   Suicidal ideation Active Problems:   Generalized anxiety disorder   Depression   Suicide attempt ([place])   Cannabis use disorder, moderate, dependence ([place])   Self-inflicted laceration of wrist (HCC)   Acetaminophen poisoning   Schizoaffective disorder, bipolar type ([place])   Tobacco use disorder   Psychosis ([place])   Hidradenitis suppurativa   Poor historian   PID (acute pelvic inflammatory disease)   Non-ulcer dyspepsia   Patient reevaluated on 11/30/2019: Patient continues to endorse suicidal ideation as well as auditory hallucinations.  This is now the second time that writer has seen this patient for the same complaint and unfortunately attempts to manage this on an outpatient basis have not been successful.  We will proceed with admission for safety, stabilization, medication management         Original note from NP as follows "    Total Time spent with patient: 30 minutes  Subjective: "I was gonna cut myself when my caregiver went to sleep" Kimberly Boyle is a 24 y.o. female patient presented to [facility] ED via POV with group home staff with the patient. The patient resides at [place]. The patient's mother passed away in 09/02/2018).  This is a patient with multiple ER visits due to her attempting to self-injured and voicing suicide ideation.  On this visit, she expressed that she would hurt herself when her caregiver goes to bed.  "I changed my mind and told her what I was planning to do."  The patient was seen face-to-face by this provider; chart reviewed and consulted with Dr. Jimmye Norman on 11/29/2019 due to the patient's care. It was discussed with the EDP that the patient would remain under observation overnight and  reassess in the a.m. to determine if she meets the criteria for psychiatric inpatient admission or could be discharged back to her group home. The patient is alert and oriented x 4, calm, cooperative, and mood-congruent with affect on evaluation. The patient does not appear to be responding to internal or external stimuli. Neither is the patient presenting with any delusional thinking. The patient denies auditory and visual hallucinations. The patient admits to suicidal ideations but denies homicidal or self-harm ideations. The patient is not presenting with any psychotic or paranoid behaviors. During an encounter with the patient, she was able to answer questions appropriately.  Plan: The patient will remain under observation overnight and reassess in the a.m. to determine if she meets criteria for psychiatric inpatient admission or could be discharged back to her group home. HPI: Per Dr. Jimmye Norman: Kimberly Boyle is a 24 y.o. female with a history of anxiety, arthritis, bipolar disorder, depression, PID, schizophrenia who presents to the ED for suicidal thoughts.  Patient states she was going to cut herself when her caregiver went to sleep.  Past Psychiatric History:  Anxiety Bipolar disorder ([place]) Depression Major depressive disorder Panic disorder Schizophrenia ([place])  Risk to Self:   No Risk to Others:  No Prior Inpatient Therapy:  Yes Prior Outpatient Therapy:  Yes  Past Medical History:  Past Medical History:  Diagnosis Date  . Anxiety   . Arthritis   . Bipolar disorder ([place])   . Depression   . Hidradenitis suppurativa 06/22/2019   Groin   . Major depressive disorder   . Panic disorder   .  PID (acute pelvic inflammatory disease) 06/22/2019   06/22/19   . Poor historian 06/22/2019  . Schizophrenia [facility])     Past Surgical History:  Procedure Laterality Date  . COLONOSCOPY WITH PROPOFOL N/A 09/29/2019   Procedure: COLONOSCOPY WITH PROPOFOL;  Surgeon: Toney Reil, MD;   Location: [facility] ENDOSCOPY;  Service: Gastroenterology;  Laterality: N/A;  . ESOPHAGOGASTRODUODENOSCOPY (EGD) WITH PROPOFOL N/A 09/29/2019   Procedure: ESOPHAGOGASTRODUODENOSCOPY (EGD) WITH PROPOFOL;  Surgeon: Toney Reil, MD;  Location: [facility] ENDOSCOPY;  Service: Gastroenterology;  Laterality: N/A;   Family History:  Family History  Problem Relation Age of Onset  . Post-traumatic stress disorder Mother   . Depression Mother   . Depression Maternal Uncle   . Cancer Maternal Grandmother    Family Psychiatric  History:  Social History:  Social History   Substance and Sexual Activity  Alcohol Use No     Social History   Substance and Sexual Activity  Drug Use Not Currently  . Types: Marijuana    Social History   Socioeconomic History  . Marital status: Single    Spouse name: Not on file  . Number of children: 0  . Years of education: Not on file  . Highest education level: High school graduate  Occupational History    Comment: unemployed  Tobacco Use  . Smoking status: Current Every Day Smoker    Packs/day: 0.50    Years: 11.00    Pack years: 5.50    Types: Cigarettes  . Smokeless tobacco: Never Used  Substance and Sexual Activity  . Alcohol use: No  . Drug use: Not Currently    Types: Marijuana  . Sexual activity: Not Currently  Other Topics Concern  . Not on file  Social History Narrative   ** Merged History Encounter **       Social Determinants of Health   Financial Resource Strain:   . Difficulty of Paying Living Expenses: Not on file  Food Insecurity:   . Worried About Programme researcher, broadcasting/film/video in the Last Year: Not on file  . Ran Out of Food in the Last Year: Not on file  Transportation Needs:   . Lack of Transportation (Medical): Not on file  . Lack of Transportation (Non-Medical): Not on file  Physical Activity:   . Days of Exercise per Week: Not on file  . Minutes of Exercise per Session: Not on file  Stress:   . Feeling of Stress : Not on file   Social Connections:   . Frequency of Communication with Friends and Family: Not on file  . Frequency of Social Gatherings with Friends and Family: Not on file  . Attends Religious Services: Not on file  . Active Member of Clubs or Organizations: Not on file  . Attends Banker Meetings: Not on file  . Marital Status: Not on file   Additional Social History:    Allergies:   Allergies  Allergen Reactions  . Penicillins Diarrhea and Nausea Only    Has patient had a PCN reaction causing immediate rash, facial/tongue/throat swelling, SOB or lightheadedness with hypotension:  no Has patient had a PCN reaction causing severe rash involving mucus membranes or skin necrosis:  no Has patient had a PCN reaction that required hospitalization: no Has patient had a PCN reaction occurring within the last 10 years: no If all of the above answers are "NO", then may proceed with Cephalosporin use.     Labs:  Results for orders placed or performed during  the hospital encounter of 11/29/19 (from the past 48 hour(s))  Comprehensive metabolic panel     Status: None   Collection Time: 11/29/19  7:24 PM  Result Value Ref Range   Sodium 136 135 - 145 mmol/L   Potassium 3.7 3.5 - 5.1 mmol/L   Chloride 107 98 - 111 mmol/L   CO2 22 22 - 32 mmol/L   Glucose, Bld 88 70 - 99 mg/dL   BUN 12 6 - 20 mg/dL   Creatinine, Ser 0.73 0.44 - 1.00 mg/dL   Calcium 9.0 8.9 - 71.0 mg/dL   Total Protein 6.6 6.5 - 8.1 g/dL   Albumin 4.0 3.5 - 5.0 g/dL   AST 22 15 - 41 U/L   ALT 24 0 - 44 U/L   Alkaline Phosphatase 73 38 - 126 U/L   Total Bilirubin 0.4 0.3 - 1.2 mg/dL   GFR calc non Af Amer >60 >60 mL/min   GFR calc Af Amer >60 >60 mL/min   Anion gap 7 5 - 15    Comment: Performed at [facility], [address] [place], [place] [address]  Ethanol     Status: None   Collection Time: 11/29/19  7:24 PM  Result Value Ref Range   Alcohol, Ethyl (B) <10 <10 mg/dL    Comment: (NOTE) Lowest  detectable limit for serum alcohol is 10 mg/dL. For medical purposes only. Performed at [facility], [address] [place], [place] [address]   Salicylate level     Status: Abnormal   Collection Time: 11/29/19  7:24 PM  Result Value Ref Range   Salicylate Lvl <7.0 (L) 7.0 - 30.0 mg/dL    Comment: Performed at [facility], [address] [place], [place] [address]  Acetaminophen level     Status: Abnormal   Collection Time: 11/29/19  7:24 PM  Result Value Ref Range   Acetaminophen (Tylenol), Serum <10 (L) 10 - 30 ug/mL    Comment: (NOTE) Therapeutic concentrations vary significantly. A range of 10-30 ug/mL  may be an effective concentration for many patients. However, some  are best treated at concentrations outside of this range. Acetaminophen concentrations >150 ug/mL at 4 hours after ingestion  and >50 ug/mL at 12 hours after ingestion are often associated with  toxic reactions. Performed at [facility], [address] [place], [place] [address]   cbc     Status: Abnormal   Collection Time: 11/29/19  7:24 PM  Result Value Ref Range   WBC 9.4 4.0 - 10.5 K/uL   RBC 4.11 3.87 - 5.11 MIL/uL   Hemoglobin 12.0 12.0 - 15.0 g/dL   HCT 82.9 93.7 - 16.9 %   MCV 90.0 80.0 - 100.0 fL   MCH 29.2 26.0 - 34.0 pg   MCHC 32.4 30.0 - 36.0 g/dL   RDW 67.8 93.8 - 10.1 %   Platelets 420 (H) 150 - 400 K/uL   nRBC 0.0 0.0 - 0.2 %    Comment: Performed at [facility], [address] [place], [place] [address]  Urine Drug Screen, Qualitative     Status: Abnormal   Collection Time: 11/29/19  7:24 PM  Result Value Ref Range   Tricyclic, Ur Screen NONE DETECTED NONE DETECTED   Amphetamines, Ur Screen NONE DETECTED NONE DETECTED   MDMA (Ecstasy)Ur Screen POSITIVE (A) NONE DETECTED   Cocaine Metabolite,Ur [place] NONE DETECTED NONE DETECTED   Opiate, Ur Screen NONE DETECTED NONE DETECTED   Phencyclidine (PCP) Ur  S NONE DETECTED NONE DETECTED    Cannabinoid 50 Ng, Ur [place] NONE DETECTED NONE DETECTED   Barbiturates, Ur Screen NONE DETECTED NONE DETECTED   Benzodiazepine, Ur Scrn NONE DETECTED NONE DETECTED   Methadone Scn, Ur NONE DETECTED NONE DETECTED    Comment: (NOTE) Tricyclics + metabolites, urine    Cutoff 1000 ng/mL Amphetamines + metabolites, urine  Cutoff 1000 ng/mL MDMA (Ecstasy), urine              Cutoff 500 ng/mL Cocaine Metabolite, urine          Cutoff 300 ng/mL Opiate + metabolites, urine        Cutoff 300 ng/mL Phencyclidine (PCP), urine         Cutoff 25 ng/mL Cannabinoid, urine                 Cutoff 50 ng/mL Barbiturates + metabolites, urine  Cutoff 200 ng/mL Benzodiazepine, urine              Cutoff 200 ng/mL Methadone, urine                   Cutoff 300 ng/mL The urine drug screen provides only a preliminary, unconfirmed analytical test result and should not be used for non-medical purposes. Clinical consideration and professional judgment should be applied to any positive drug screen result due to possible interfering substances. A more specific alternate chemical method must be used in order to obtain a confirmed analytical result. Gas chromatography / mass spectrometry (GC/MS) is the preferred confirmat ory method. Performed at [facility], [address] [place], [place] [address]   Pregnancy, urine POC     Status: None   Collection Time: 11/29/19  7:52 PM  Result Value Ref Range   Preg Test, Ur NEGATIVE NEGATIVE    Comment:        THE SENSITIVITY OF THIS METHODOLOGY IS >24 mIU/mL   SARS CORONAVIRUS 2 (TAT 6-24 HRS) Nasopharyngeal Nasopharyngeal Swab     Status: None   Collection Time: 11/30/19 12:05 AM   Specimen: Nasopharyngeal Swab  Result Value Ref Range   SARS Coronavirus 2 NEGATIVE NEGATIVE    Comment: (NOTE) SARS-CoV-2 target nucleic acids are NOT DETECTED. The SARS-CoV-2 RNA is generally detectable in upper and lower respiratory specimens during the acute phase of  infection. Negative results do not preclude SARS-CoV-2 infection, do not rule out co-infections with other pathogens, and should not be used as the sole basis for treatment or other patient management decisions. Negative results must be combined with clinical observations, patient history, and epidemiological information. The expected result is Negative. Fact Sheet for Patients: HairSlick.no Fact Sheet for Healthcare Providers: quierodirigir.com This test is not yet approved or cleared by the [place] FDA and  has been authorized for detection and/or diagnosis of SARS-CoV-2 by FDA under an Emergency Use Authorization (EUA). This EUA will remain  in effect (meaning this test can be used) for the duration of the COVID-19 declaration under Section 56 4(b)(1) of the Act, 21 U.S.C. section 360bbb-3(b)(1), unless the authorization is terminated or revoked sooner. Performed at [facility], [address], [place], [place] [address]     No current facility-administered medications for this encounter.   Current Outpatient Medications  Medication Sig Dispense Refill  . benztropine (COGENTIN) 1 MG tablet Take 1 mg by mouth at bedtime.    . clonazePAM (KLONOPIN) 0.5 MG tablet Take 1 tablet (0.5 mg total) by mouth 2 (two) times  daily as needed (anxiety). 60 tablet 0  . cloZAPine (CLOZARIL) 100 MG tablet Take 400 mg by mouth at bedtime.  5  . dicyclomine (BENTYL) 10 MG capsule Take 1 capsule (10 mg total) by mouth 4 (four) times daily -  before meals and at bedtime. 120 capsule 0  . fenofibrate (TRICOR) 145 MG tablet Take 145 mg by mouth daily.    Marland Kitchen lithium carbonate 300 MG capsule Take 300 mg by mouth 2 (two) times daily.    Marland Kitchen omeprazole (PRILOSEC) 40 MG capsule Take 1 capsule (40 mg total) by mouth 2 (two) times daily before a meal. 60 capsule 2  . paliperidone (INVEGA SUSTENNA) 234 MG/1.5ML SUSY injection Inject 234 mg into the  muscle every 28 (twenty-eight) days. Next dose on 07/06/2018 (Patient taking differently: Inject 234 mg into the muscle every 28 (twenty-eight) days. ) 1.8 mL 1  . sulfamethoxazole-trimethoprim (BACTRIM DS) 800-160 MG tablet Take 1 tablet by mouth 2 (two) times daily. 20 tablet -  . traZODone (DESYREL) 100 MG tablet Take 100 mg by mouth at bedtime.      Musculoskeletal: Strength & Muscle Tone: within normal limits Gait & Station: normal Patient leans: N/A  Psychiatric Specialty Exam: Physical Exam  Nursing note and vitals reviewed. Constitutional: She appears well-developed and well-nourished.  Cardiovascular: Normal rate.  Respiratory: Effort normal.  Musculoskeletal:        General: Normal range of motion.     Cervical back: Normal range of motion and neck supple.  Neurological: She is alert.  Psychiatric: She has a normal mood and affect. Her behavior is normal.    Review of Systems  Psychiatric/Behavioral: The patient is nervous/anxious.   All other systems reviewed and are negative.   Blood pressure 116/63, pulse 78, temperature 98.3 F (36.8 C), temperature source Oral, resp. rate 16, height 5\' 4"  (1.626 m), weight 96.6 kg, last menstrual period 11/22/2019, SpO2 97 %.Body mass index is 36.56 kg/m.  General Appearance: Casual  Eye Contact:  Good  Speech:  Clear and Coherent  Volume:  Normal  Mood:  NA  Affect:  Appropriate  Thought Process:  Coherent  Orientation:  Full (Time, Place, and Person)  Thought Content:  Logical and Hallucinations: Auditory Command:  Telling her to get into the oven  Suicidal Thoughts:  Yes.  without intent/plan  Homicidal Thoughts:  No  Memory:  Immediate;   Good Recent;   Good Remote;   Good  Judgement:  Fair  Insight:  Lacking  Psychomotor Activity:  Normal  Concentration:  Concentration: Good and Attention Span: Good  Recall:  Good  Fund of Knowledge:  Good  Language:  Good  Akathisia:  Negative  Handed:  Right  AIMS (if  indicated):     Assets:  Desire for Improvement Leisure Time Resilience  ADL's:  Intact  Cognition:  WNL  Sleep:    Good    "  Treatment Plan Summary: 24 year old female with history of schizoaffective disorder presenting with both perceptual and mood disturbances.  Patient will require inpatient hospitalization for safety, stabilization, medication management  Disposition: Recommend psychiatric Inpatient admission when medically cleared.  22, MD 11/30/2019 12:47 PM

## 2019-11-30 NOTE — ED Notes (Signed)
VOL  PENDING  PLACEMENT 

## 2019-11-30 NOTE — ED Notes (Signed)
Dinner tray provided

## 2019-11-30 NOTE — Plan of Care (Signed)
Patient new to the unit  Problem: Education: Goal: Knowledge of [place] [facility] Education information/materials will improve Outcome: Not Progressing Goal: Emotional status will improve Outcome: Not Progressing Goal: Mental status will improve Outcome: Not Progressing Goal: Verbalization of understanding the information provided will improve Outcome: Not Progressing   Problem: Safety: Goal: Periods of time without injury will increase Outcome: Not Progressing   Problem: Education: Goal: Ability to make informed decisions regarding treatment will improve Outcome: Not Progressing   Problem: Self-Concept: Goal: Ability to disclose and discuss suicidal ideas will improve Outcome: Not Progressing Goal: Will verbalize positive feelings about self Outcome: Not Progressing

## 2019-12-01 DIAGNOSIS — F251 Schizoaffective disorder, depressive type: Secondary | ICD-10-CM

## 2019-12-01 DIAGNOSIS — E669 Obesity, unspecified: Secondary | ICD-10-CM

## 2019-12-01 DIAGNOSIS — Z5181 Encounter for therapeutic drug level monitoring: Secondary | ICD-10-CM

## 2019-12-01 LAB — TSH: TSH: 3.273 u[IU]/mL (ref 0.350–4.500)

## 2019-12-01 LAB — LIPID PANEL
Cholesterol: 155 mg/dL (ref 0–200)
HDL: 30 mg/dL — ABNORMAL LOW (ref >40)
LDL Cholesterol: 78 mg/dL (ref 0–99)
Total CHOL/HDL Ratio: 5.2 RATIO
Triglycerides: 236 mg/dL — ABNORMAL HIGH (ref <150)
VLDL: 47 mg/dL — ABNORMAL HIGH (ref 0–40)

## 2019-12-01 LAB — HEPATIC FUNCTION PANEL
ALT: 28 U/L (ref 0–44)
AST: 27 U/L (ref 15–41)
Albumin: 3.7 g/dL (ref 3.5–5.0)
Alkaline Phosphatase: 70 U/L (ref 38–126)
Bilirubin, Direct: <0.1 mg/dL (ref 0.0–0.2)
Total Bilirubin: 0.4 mg/dL (ref 0.3–1.2)
Total Protein: 6.6 g/dL (ref 6.5–8.1)

## 2019-12-01 MED ORDER — DOCUSATE SODIUM 100 MG PO CAPS
100.0000 mg | ORAL_CAPSULE | Freq: Two times a day (BID) | ORAL | Status: DC
  Administered 2019-12-01 – 2019-12-06 (×10): 100 mg via ORAL
  Filled 2019-12-01 (×10): qty 1

## 2019-12-01 MED ORDER — IPRATROPIUM BROMIDE 0.06 % NA SOLN
2.0000 | Freq: Every day | NASAL | Status: DC
  Administered 2019-12-01 – 2019-12-02 (×2): 2 via NASAL
  Filled 2019-12-01: qty 15

## 2019-12-01 MED ORDER — HALOPERIDOL 1 MG PO TABS
2.0000 mg | ORAL_TABLET | Freq: Two times a day (BID) | ORAL | Status: DC
  Administered 2019-12-01 – 2019-12-02 (×3): 2 mg via ORAL
  Filled 2019-12-01 (×3): qty 2

## 2019-12-01 MED ORDER — LITHIUM CARBONATE 300 MG PO CAPS
600.0000 mg | ORAL_CAPSULE | Freq: Two times a day (BID) | ORAL | Status: DC
  Administered 2019-12-01 – 2019-12-06 (×10): 600 mg via ORAL
  Filled 2019-12-01 (×10): qty 2

## 2019-12-01 NOTE — Progress Notes (Signed)
Recreation Therapy Notes  Date: 12/01/2019  Time: 9:30 am   Location: Craft room   Behavioral response: N/A   Intervention Topic: Self-care   Discussion/Intervention: Patient did not attend group.   Clinical Observations/Feedback:  Patient did not attend group.   Tameka Hoiland LRT/CTRS        Trena Dunavan 12/01/2019 11:44 AM

## 2019-12-01 NOTE — Tx Team (Signed)
Initial Treatment Plan 12/01/2019 12:08 AM [name] [id number]    PATIENT STRESSORS: Marital or family conflict Medication change or noncompliance   PATIENT STRENGTHS: Motivation for treatment/growth Supportive family/friends   PATIENT IDENTIFIED PROBLEMS: Anxiety  Disorder     Depression     Suicidal ideation              DISCHARGE CRITERIA:  Improved stabilization in mood, thinking, and/or behavior Motivation to continue treatment in a less acute level of care  PRELIMINARY DISCHARGE PLAN: Outpatient therapy  PATIENT/FAMILY INVOLVEMENT: This treatment plan has been presented to and reviewed with the patient, Kimberly Boyle,  The patient and family have been given the opportunity to ask questions and make suggestions.  Trula Ore, RN 12/01/2019, 12:08 AM

## 2019-12-01 NOTE — Progress Notes (Signed)
Recreation Therapy Notes  INPATIENT RECREATION THERAPY ASSESSMENT  Patient Details Name: Kimberly Boyle MRN: [id number] DOB: 07-13-1996 Today's Date: 12/01/2019       Information Obtained From: Patient  Able to Participate in Assessment/Interview: Yes  Patient Presentation: Responsive  Reason for Admission (Per Patient): Active Symptoms, Suicidal Ideation  Patient Stressors:    Coping Skills:   Art  Leisure Interests (2+):  Art - Draw, Art - Coloring  Frequency of Recreation/Participation: Weekly  Awareness of Community Resources:     [employer]:     Current Use:    If no, Barriers?:    Expressed Interest in [employer] Information:    [place] of Residence:  [place]  Patient Main Form of Transportation:    Patient Strengths:  N/A  Patient Identified Areas of Improvement:  My thoughts  Patient Goal for Hospitalization:  Get better medication  Current SI (including self-harm):     Current HI:     Current AVH:    Staff Intervention Plan: Group Attendance, Collaborate with Interdisciplinary Treatment Team  Consent to Intern Participation: N/A  Petrea Fredenburg 12/01/2019, 3:06 PM

## 2019-12-01 NOTE — [facility] Suicide Risk Assessment (Signed)
[facility] INPATIENT:  Family/Significant Other Suicide Prevention Education  Suicide Prevention Education:  Education Completed; [place] APS legal guardian, [phone], has been identified by the patient as the family member/significant other with whom the patient will be residing, and identified as the person(s) who will aid the patient in the event of a mental health crisis (suicidal ideations/suicide attempt).  With written consent from the patient, the family member/significant other has been provided the following suicide prevention education, prior to the and/or following the discharge of the patient.  The suicide prevention education provided includes the following:  Suicide risk factors  Suicide prevention and interventions  National Suicide Hotline telephone number  [facility] Early assessment telephone number  [facility] Emergency Assistance 911  [facility] and/or Residential Mobile Crisis Unit telephone number  Request made of family/significant other to:  Remove weapons (e.g., guns, rifles, knives), all items previously/currently identified as safety concern.    Remove drugs/medications (over-the-counter, prescriptions, illicit drugs), all items previously/currently identified as a safety concern.  The family member/significant other verbalizes understanding of the suicide prevention education information provided.  The family member/significant other agrees to remove the items of safety concern listed above.  Harden Mo 12/01/2019, 10:40 AM

## 2019-12-01 NOTE — [facility] Group Notes (Signed)
LCSW Group Therapy Note  12/01/2019 1:00 PM  Type of Therapy and Topic:  Group Therapy:  Feelings around Relapse and Recovery  Participation Level:  Did Not Attend   Description of Group:    Patients in this group will discuss emotions they experience before and after a relapse. They will process how experiencing these feelings, or avoidance of experiencing them, relates to having a relapse. Facilitator will guide patients to explore emotions they have related to recovery. Patients will be encouraged to process which emotions are more powerful. They will be guided to discuss the emotional reaction significant others in their lives may have to their relapse or recovery. Patients will be assisted in exploring ways to respond to the emotions of others without this contributing to a relapse.  Therapeutic Goals: 1. Patient will identify two or more emotions that lead to a relapse for them 2. Patient will identify two emotions that result when they relapse 3. Patient will identify two emotions related to recovery 4. Patient will demonstrate ability to communicate their needs through discussion and/or role plays   Summary of Patient Progress: X  Therapeutic Modalities:   Cognitive Behavioral Therapy Solution-Focused Therapy Assertiveness Training Relapse Prevention Therapy   Kimberly Boyle, MSW, LCSW 12/01/2019 10:47 AM 

## 2019-12-01 NOTE — [facility] Suicide Risk Assessment (Signed)
[facility] INPATIENT:  Family/Significant Other Suicide Prevention Education  Suicide Prevention Education:  Contact Attempts: Kimberly Boyle, legal guardian [facility] Department of Social Services [phone] has been identified by the patient as the family member/significant other with whom the patient will be residing, and identified as the person(s) who will aid the patient in the event of a mental health crisis.  With written consent from the patient, two attempts were made to provide suicide prevention education, prior to and/or following the patient's discharge.  We were unsuccessful in providing suicide prevention education.  A suicide education pamphlet was given to the patient to share with family/significant other.  Date and time of first attempt: 12/01/2019 at 9:51AM Date and time of second attempt:Second attempt is needed.  CSW left HIPAA compliant note.   Harden Mo  12/01/2019, 9:50 AM

## 2019-12-01 NOTE — [facility] Suicide Risk Assessment (Signed)
[facility] Admission Suicide Risk Assessment   Nursing information obtained from:  Patient Demographic factors:  NA Current Mental Status:  Suicidal ideation indicated by patient Loss Factors:  NA Historical Factors:  Impulsivity Risk Reduction Factors:  NA  Total Time spent with patient: 1 hour Principal Problem: Schizoaffective disorder ([place]) Diagnosis:  Principal Problem:   Schizoaffective disorder ([place]) Active Problems:   Suicidal ideation   Obesity  Subjective Data: Patient seen chart reviewed and known from previous encounters.  24 year old woman with schizoaffective disorder or schizophrenia presents with daily intense auditory hallucinations which are command type and insulting.  Having active suicidal thoughts and wishes.  Poor functioning.  Feeling miserable.  Currently compliant with medication.  Denies substance abuse.  Continued Clinical Symptoms:  Alcohol Use Disorder Identification Test Final Score (AUDIT): 0 The "Alcohol Use Disorders Identification Test", Guidelines for Use in Primary Care, Second Edition.  World Pharmacologist [facility]). Score between 0-7:  no or low risk or alcohol related problems. Score between 8-15:  moderate risk of alcohol related problems. Score between 16-19:  high risk of alcohol related problems. Score 20 or above:  warrants further diagnostic evaluation for alcohol dependence and treatment.   CLINICAL FACTORS:   Severe Anxiety and/or Agitation Depression:   Anhedonia Hopelessness Impulsivity Schizophrenia:   Command hallucinatons   Musculoskeletal: Strength & Muscle Tone: within normal limits Gait & Station: normal Patient leans: N/A  Psychiatric Specialty Exam: Physical Exam  Nursing note and vitals reviewed. Constitutional: She appears well-developed and well-nourished.  HENT:  Head: Normocephalic and atraumatic.  Eyes: Pupils are equal, round, and reactive to light. Conjunctivae are normal.  Cardiovascular: Normal heart  sounds.  Respiratory: Effort normal.  GI: Soft.  Musculoskeletal:        General: Normal range of motion.     Cervical back: Normal range of motion.  Neurological: She is alert.  Skin: Skin is warm and dry.  Patient has extensive blue staining on her face and fingers which appears to just be from hair dye.  Multiple old healed keloid type scars on the left forearm from cutting but no active wounds identified.  Psychiatric: Judgment normal. Her affect is blunt. Her speech is delayed. She is slowed and withdrawn. She is not agitated and not aggressive. Thought content is paranoid and delusional. Cognition and memory are impaired. She exhibits a depressed mood. She expresses suicidal ideation. She expresses no suicidal plans.    Review of Systems  Constitutional: Negative.   HENT: Negative.   Eyes: Negative.   Respiratory: Negative.   Cardiovascular: Negative.   Gastrointestinal: Negative.   Musculoskeletal: Negative.   Skin: Negative.   Neurological: Negative.   Psychiatric/Behavioral: Positive for confusion, decreased concentration, dysphoric mood, hallucinations, sleep disturbance and suicidal ideas. The patient is nervous/anxious.     Blood pressure (!) 140/98, pulse 81, temperature 98.1 F (36.7 C), temperature source Oral, resp. rate 16, height 5\' 4"  (1.626 m), weight 96.6 kg, last menstrual period 11/22/2019, SpO2 98 %.Body mass index is 36.56 kg/m.  General Appearance: Disheveled  Eye Contact:  Minimal  Speech:  Slow  Volume:  Decreased  Mood:  Dysphoric  Affect:  Depressed  Thought Process:  Disorganized  Orientation:  Full (Time, Place, and Person)  Thought Content:  Hallucinations: Auditory Command:  Patient reports that her voices tell her to kill her self or do violent acts. and Rumination  Suicidal Thoughts:  Yes.  without intent/plan  Homicidal Thoughts:  No  Memory:  Immediate;   Fair Recent;  Poor Remote;   Poor  Judgement:  Impaired  Insight:  Shallow   Psychomotor Activity:  Decreased  Concentration:  Concentration: Poor  Recall:  Poor  Fund of Knowledge:  Fair  Language:  Fair  Akathisia:  No  Handed:  Right  AIMS (if indicated):     Assets:  Desire for Improvement Financial Resources/Insurance Housing Resilience  ADL's:  Impaired  Cognition:  Impaired,  Mild  Sleep:  Number of Hours: 8.25      COGNITIVE FEATURES THAT CONTRIBUTE TO RISK:  Thought constriction (tunnel vision)    SUICIDE RISK:   Mild:  Suicidal ideation of limited frequency, intensity, duration, and specificity.  There are no identifiable plans, no associated intent, mild dysphoria and related symptoms, good self-control (both objective and subjective assessment), few other risk factors, and identifiable protective factors, including available and accessible social support.  PLAN OF CARE: Continue 15-minute checks.  Review medication profile.  Get some more labs done.  Make some decisions about alterations in medication.  Treatment team will evaluate patient and we will work on ongoing reassessment of symptoms and safety prior to discharge  I certify that inpatient services furnished can reasonably be expected to improve the patient's condition.   Mordecai Rasmussen, MD 12/01/2019, 10:16 AM

## 2019-12-01 NOTE — [facility] Counselor (Signed)
CSW spoke with Kimberly Boyle APS legal guardian, [phone].  Per guardian the patient does have an ACT team with [name]. She confirmed that patient stays in [facility] group home.  She was unsure of contact name or person at this time.   CSW asked for fax information so releases and consents can be sent to her for signature as well as copy of guardianship paperwork.  CSW was asked to fax to [phone].  CSW reviewed reason for hospitalization.   CSW received confirmation the fax was successful.  Penni Homans, MSW, LCSW 12/01/2019 10:42 AM

## 2019-12-01 NOTE — [facility] Counselor (Signed)
Adult Comprehensive Assessment  Patient ID: Kimberly Boyle, female   DOB: 02/17/1996, 24 y.o.   MRN: [id number]   Information Source: Information source: Patient   Current Stressors:  Patient states their primary concerns and needs for treatment are:: Pt reports "bad suicidal thoughts".  Patient states their goals for this hospitalization and ongoing recovery are:: Pt reports "find a better medication".  Educational / Learning stressors: Pt denies.   Employment / Job issues: Pt denies.   Family Relationships: Pt reports "they don't talk to me and don't care about me".   Financial / Lack of resources (include bankruptcy): Pt denies.   Housing / Lack of housing: Pt denies.   Physical health (include injuries & life threatening diseases): Pt denies.   Social relationships: Pt reports "fine".  Substance abuse: Pt denies.   Bereavement / Loss: Pt reports that her mother passed 2 years ago.    Living/Environment/Situation:  Living Arrangements: Group Home Who else lives in the home?: [place] residents How long has patient lived in current situation?: 6-7 months What is atmosphere in current home: Comfortable   Family History:  Marital status: Single Are you sexually active?: No What is your sexual orientation?: "I am everything" Has your sexual activity been affected by drugs, alcohol, medication, or emotional stress?: no Does patient have children?: No   Childhood History:  By whom was/is the patient raised?: Both parents Additional childhood history information:  Description of patient's relationship with caregiver when they were a child: Pt states she was raised by both parents; Pt reports she has had a "good and bad" relationship with her mother.  Pt states her father has always been physically there but not emotionally Patient's description of current relationship with people who raised him/her: Pt reports "not good, he doesn't talk to me".   How were you disciplined when you  got in trouble as a child/adolescent?: Pt reports "mom would jump on me" Does patient have siblings?: No Did patient suffer any verbal/emotional/physical/sexual abuse as a child?: No Did patient suffer from severe childhood neglect?: No Has patient ever been sexually abused/assaulted/raped as an adolescent or adult?: No Was the patient ever a victim of a crime or a disaster?: No Witnessed domestic violence?: No Has patient been effected by domestic violence as an adult?: No   Education:  Highest grade of school patient has completed: 12th Currently a student?: No Learning disability?: No   Employment/Work Situation:   Employment situation: On Disability Why is patient on disability?: Pt reports "my mom deemed me incompetent". How long has patient been on disability?:  Pt reports "a year or two".  What is the longest time patient has a held a job?: 2 years Where was the patient employed at that time?: [employer] Did You Receive Any Psychiatric Treatment/Services While in the [employer]?: (NA) Are There Guns or Other Weapons in [place]?: No   Financial Resources:   Financial resources: Support from parents / caregiver Does patient have a Programmer, applications or guardian?: Yes Name of representative payee or guardian: Kalman Drape, [phone] or [phone]   Alcohol/Substance Abuse:   What has been your use of drugs/alcohol within the last 12 months?: Pt denies If attempted suicide, did drugs/alcohol play a role in this?: No (Patient reports I came close to carrying out my plan to cut my arm". ) Alcohol/Substance Abuse Treatment Hx: Denies past history Has alcohol/substance abuse ever caused legal problems?: No   Social Support System:   Heritage manager  System: None Type of faith/religion: Pt denies.   Leisure/Recreation:   Leisure and Hobbies: Pt reports "drawing, helping others, sleep".    Strengths/Needs:   What is the patient's perception of their  strengths?: Pt reports "not really".  Patient states they can use these personal strengths during their treatment to contribute to their recovery: "I don't know" Patient states these barriers may affect/interfere with their treatment: none reported Patient states these barriers may affect their return to the community: none reported   Discharge Plan:   Currently receiving community mental health services: Yes (From Whom)([name] ACT team) Patient states concerns and preferences for aftercare planning are: Pt reports she is going back to her ACT team with [name].   Patient states they will know when they are safe and ready for discharge when: Pt reports "I'll tell you when I don't have the urge to destroy myself".   Does patient have access to transportation?: Yes(group home will pick her up) Does patient have financial barriers related to discharge medications?: No Will patient be returning to same living situation after discharge?: Yes(pt is returning to her group home)   Summary/Recommendations: Patient is a 24 year old female from [place], [place] [facility] [place]).   She presents to the hospital following increasing auditory hallucinations and suicidal ideations.  She has a primary diagnosis of Schizoaffective Disorder.  Recommendations include: crisis stabilization, therapeutic milieu, encourage group attendance and participation, medication management for detox/mood stabilization and development of comprehensive mental wellness/sobriety plan.       Harden Mo. 12/01/2019

## 2019-12-01 NOTE — Plan of Care (Signed)
Patient stated that the voices are ugly and it is bothering her today.Patient verbalized suicidal thoughts at times.Contracts for safety.Patient slept most of the day.Compliant with medications.Did not attend groups.Appetite and energy level good.Personal hygiene maintained.Support and encouragement given.

## 2019-12-01 NOTE — H&P (Signed)
Psychiatric Admission Assessment Adult  Patient Identification: Kimberly Boyle MRN:  [id number] Date of Evaluation:  12/01/2019 Chief Complaint:  Schizoaffective disorder (HCC) [F25.9] Principal Diagnosis: Schizoaffective disorder (HCC) Diagnosis:  Principal Problem:   Schizoaffective disorder (HCC) Active Problems:   Suicidal ideation   Obesity  History of Present Illness: Patient seen and chart reviewed.  24 year old woman with a history of chronic mental illness came to the emergency room from her group home stating she was having auditory hallucinations with suicidal commands.  She tells me that she has been having symptoms for "about 3 years".  Has a hard time being more specific than that but it sounds like based on her presentations that her symptoms have been significantly worse for at least a month may be a couple of months.  There has not been any known change to her medication other than the fact that she discontinued taking [place] because she felt that it was not working.  Patient has a drug screen positive for MDMA but denies any substance abuse.  She reports that her mood stays down negative bad hopeless all the time.  Cannot name anything positive in her life.  Says that she hears voices almost constantly and that they are ugly and mean to her and tell her to kill herself or do violent things to others.  She reports being compliant with her medicine.  She cannot put her finger on any other specific new stressor.  Does not have any specific complaints about her group home. Associated Signs/Symptoms: Depression Symptoms:  depressed mood, anhedonia, psychomotor retardation, fatigue, difficulty concentrating, suicidal thoughts without plan, loss of energy/fatigue, disturbed sleep, (Hypo) Manic Symptoms:  Impulsivity, Anxiety Symptoms:  Excessive Worry, Psychotic Symptoms:  Hallucinations: Auditory Command:  Patient states that her hallucinations frequently tell her to  hurt herself or do something violent. Paranoia, PTSD Symptoms: Negative Total Time spent with patient: 1 hour  Past Psychiatric History: Patient has a past history of chronic psychotic disorder.  Previously diagnosed with schizoaffective disorder.  Last known hospitalization in 2019.  Has had some presentations to our emergency room more recently for symptoms similar to what she is reporting now.  Patient has been on multiple antipsychotics in the past including Risperdal Invega Zyprexa all without adequate benefit.  Clozapine did seem to be of some help in the past.  She does have a past history of extensive self injury and suicidal behavior.  Has a history of marijuana abuse but denies any recent drug use at all.  The MDMA positive drug screen came up a week ago also and she could not come up with an explanation for it then either.  Is the patient at risk to self? Yes.    Has the patient been a risk to self in the past 6 months? Yes.    Has the patient been a risk to self within the distant past? Yes.    Is the patient a risk to others? No.  Has the patient been a risk to others in the past 6 months? No.  Has the patient been a risk to others within the distant past? No.   Prior Inpatient Therapy:   Prior Outpatient Therapy:    Alcohol Screening: 1. How often do you have a drink containing alcohol?: Never 2. How many drinks containing alcohol do you have on a typical day when you are drinking?: 1 or 2 3. How often do you have six or more drinks on one occasion?: Never AUDIT-C Score:  0 4. How often during the last year have you found that you were not able to stop drinking once you had started?: Never 5. How often during the last year have you failed to do what was normally expected from you becasue of drinking?: Never 6. How often during the last year have you needed a first drink in the morning to get yourself going after a heavy drinking session?: Never 7. How often during the last year  have you had a feeling of guilt of remorse after drinking?: Never 8. How often during the last year have you been unable to remember what happened the night before because you had been drinking?: Never 9. Have you or someone else been injured as a result of your drinking?: No 10. Has a relative or friend or a doctor or another health worker been concerned about your drinking or suggested you cut down?: No Alcohol Use Disorder Identification Test Final Score (AUDIT): 0 Alcohol Brief Interventions/Follow-up: AUDIT Score <7 follow-up not indicated Substance Abuse History in the last 12 months:  No. Consequences of Substance Abuse: Negative Previous Psychotropic Medications: Yes  Psychological Evaluations: Yes  Past Medical History:  Past Medical History:  Diagnosis Date  . Anxiety   . Arthritis   . Bipolar disorder (HCC)   . Depression   . Hidradenitis suppurativa 06/22/2019   Groin   . Major depressive disorder   . Panic disorder   . PID (acute pelvic inflammatory disease) 06/22/2019   06/22/19   . Poor historian 06/22/2019  . Schizophrenia [facility])     Past Surgical History:  Procedure Laterality Date  . COLONOSCOPY WITH PROPOFOL N/A 09/29/2019   Procedure: COLONOSCOPY WITH PROPOFOL;  Surgeon: Toney Reil, MD;  Location: [facility] ENDOSCOPY;  Service: Gastroenterology;  Laterality: N/A;  . ESOPHAGOGASTRODUODENOSCOPY (EGD) WITH PROPOFOL N/A 09/29/2019   Procedure: ESOPHAGOGASTRODUODENOSCOPY (EGD) WITH PROPOFOL;  Surgeon: Toney Reil, MD;  Location: [facility] ENDOSCOPY;  Service: Gastroenterology;  Laterality: N/A;   Family History:  Family History  Problem Relation Age of Onset  . Post-traumatic stress disorder Mother   . Depression Mother   . Depression Maternal Uncle   . Cancer Maternal Grandmother    Family Psychiatric  History: Nothing known Tobacco Screening: Have you used any form of tobacco in the last 30 days? (Cigarettes, Smokeless Tobacco, Cigars, and/or Pipes):  Yes Tobacco use, Select all that apply: 4 or less cigarettes per day Are you interested in Tobacco Cessation Medications?: No, patient refused Counseled patient on smoking cessation including recognizing danger situations, developing coping skills and basic information about quitting provided: Refused/Declined practical counseling Social History:  Social History   Substance and Sexual Activity  Alcohol Use No     Social History   Substance and Sexual Activity  Drug Use Not Currently  . Types: Marijuana    Additional Social History:                           Allergies:   Allergies  Allergen Reactions  . Penicillins Diarrhea and Nausea Only    Has patient had a PCN reaction causing immediate rash, facial/tongue/throat swelling, SOB or lightheadedness with hypotension:  no Has patient had a PCN reaction causing severe rash involving mucus membranes or skin necrosis:  no Has patient had a PCN reaction that required hospitalization: no Has patient had a PCN reaction occurring within the last 10 years: no If all of the above answers are "NO", then may  proceed with Cephalosporin use.    Lab Results:  Results for orders placed or performed during the hospital encounter of 11/30/19 (from the past 48 hour(s))  Hepatic function panel     Status: None   Collection Time: 11/30/19  8:16 PM  Result Value Ref Range   Total Protein 6.6 6.5 - 8.1 g/dL   Albumin 3.7 3.5 - 5.0 g/dL   AST 27 15 - 41 U/L   ALT 28 0 - 44 U/L   Alkaline Phosphatase 70 38 - 126 U/L   Total Bilirubin 0.4 0.3 - 1.2 mg/dL   Bilirubin, Direct <0.1 0.0 - 0.2 mg/dL   Indirect Bilirubin NOT CALCULATED 0.3 - 0.9 mg/dL    Comment: Performed at [facility], [address] [place], [place] [address]  Lipid panel     Status: Abnormal   Collection Time: 11/30/19  8:16 PM  Result Value Ref Range   Cholesterol 155 0 - 200 mg/dL   Triglycerides 236 (H) <150 mg/dL   HDL 30 (L) >40 mg/dL   Total  CHOL/HDL Ratio 5.2 RATIO   VLDL 47 (H) 0 - 40 mg/dL   LDL Cholesterol 78 0 - 99 mg/dL    Comment:        Total Cholesterol/HDL:CHD Risk Coronary Heart Disease Risk Table                     Men   Women  1/2 Average Risk   3.4   3.3  Average Risk       5.0   4.4  2 X Average Risk   9.6   7.1  3 X Average Risk  23.4   11.0        Use the calculated Patient Ratio above and the CHD Risk Table to determine the patient's CHD Risk.        ATP III CLASSIFICATION (LDL):  <100     mg/dL   Optimal  100-129  mg/dL   Near or Above                    Optimal  130-159  mg/dL   Borderline  160-189  mg/dL   High  >190     mg/dL   Very High Performed at [facility], [address] [place], [place] [address]   TSH     Status: None   Collection Time: 11/30/19  8:16 PM  Result Value Ref Range   TSH 3.273 0.350 - 4.500 uIU/mL    Comment: Performed by a 3rd Generation assay with a functional sensitivity of <=0.01 uIU/mL. Performed at [facility], [address] [place], [place] [address]   CBC with Differential/Platelet     Status: Abnormal   Collection Time: 11/30/19  8:17 PM  Result Value Ref Range   WBC 9.4 4.0 - 10.5 K/uL   RBC 4.24 3.87 - 5.11 MIL/uL   Hemoglobin 12.3 12.0 - 15.0 g/dL   HCT 38.4 36.0 - 46.0 %   MCV 90.6 80.0 - 100.0 fL   MCH 29.0 26.0 - 34.0 pg   MCHC 32.0 30.0 - 36.0 g/dL   RDW 13.0 11.5 - 15.5 %   Platelets 471 (H) 150 - 400 K/uL   nRBC 0.0 0.0 - 0.2 %   Neutrophils Relative % 63 %   Neutro Abs 5.9 1.7 - 7.7 K/uL   Lymphocytes Relative 28 %   Lymphs Abs 2.6 0.7 - 4.0 K/uL  Monocytes Relative 9 %   Monocytes Absolute 0.8 0.1 - 1.0 K/uL   Eosinophils Relative 0 %   Eosinophils Absolute 0.0 0.0 - 0.5 K/uL   Basophils Relative 0 %   Basophils Absolute 0.0 0.0 - 0.1 K/uL   Immature Granulocytes 0 %   Abs Immature Granulocytes 0.04 0.00 - 0.07 K/uL    Comment: Performed at [facility], [address] [place], [place] [address]     Blood Alcohol level:  Lab Results  [facility]   [facility]   ETH <10 11/21/2019    Metabolic Disorder Labs:  Lab Results  Component Value Date   HGBA1C 5.1 05/05/2018   MPG 99.67 05/05/2018   Lab Results  Component Value Date   PROLACTIN 132.8 (H) 10/25/2017   Lab Results  Component Value Date   CHOL 155 11/30/2019   TRIG 236 (H) 11/30/2019   HDL 30 (L) 11/30/2019   CHOLHDL 5.2 11/30/2019   VLDL 47 (H) 11/30/2019   LDLCALC 78 11/30/2019   LDLCALC 124 (H) 05/07/2018    Current Medications: Current Facility-Administered Medications  Medication Dose Route Frequency Provider Last Rate Last Admin  . alum & mag hydroxide-simeth (MAALOX/MYLANTA) 200-200-20 MG/5ML suspension 30 mL  30 mL Oral Q4H PRN Cristofano, Worthy RancherPaul A, MD      . clonazePAM (KLONOPIN) tablet 0.5 mg  0.5 mg Oral BID PRN Cristofano, Worthy RancherPaul A, MD   0.5 mg at 12/01/19 0833  . cloZAPine (CLOZARIL) tablet 400 mg  400 mg Oral QHS Cristofano, Worthy RancherPaul A, MD   400 mg at 11/30/19 2115  . docusate sodium (COLACE) capsule 100 mg  100 mg Oral BID Kaleeyah Cuffie T, MD      . haloperidol (HALDOL) tablet 2 mg  2 mg Oral BID Gwenetta Devos, Jackquline DenmarkJohn T, MD   2 mg at 12/01/19 1004  . ipratropium (ATROVENT) 0.06 % nasal spray 2 spray  2 spray Each Nare QHS Kourtnie Sachs T, MD      . lithium carbonate capsule 600 mg  600 mg Oral BID Kaiyana Bedore T, MD      . magnesium hydroxide (MILK OF MAGNESIA) suspension 30 mL  30 mL Oral Daily PRN Cristofano, Worthy RancherPaul A, MD       PTA Medications: Medications Prior to Admission  Medication Sig Dispense Refill Last Dose  . benztropine (COGENTIN) 1 MG tablet Take 1 mg by mouth at bedtime.     . busPIRone (BUSPAR) 10 MG tablet Take 10 mg by mouth 2 (two) times daily.     . clonazePAM (KLONOPIN) 0.5 MG tablet Take 1 tablet (0.5 mg total) by mouth 2 (two) times daily as needed (anxiety). 60 tablet 0   . cloZAPine (CLOZARIL) 100 MG tablet Take 400 mg by mouth at bedtime.  5   . dicyclomine (BENTYL)  10 MG capsule Take 1 capsule (10 mg total) by mouth 4 (four) times daily -  before meals and at bedtime. 120 capsule 0   . fenofibrate (TRICOR) 145 MG tablet Take 145 mg by mouth daily.     Marland Kitchen. lithium carbonate 300 MG capsule Take 300 mg by mouth 2 (two) times daily.     Marland Kitchen. omeprazole (PRILOSEC) 40 MG capsule Take 1 capsule (40 mg total) by mouth 2 (two) times daily before a meal. 60 capsule 2   . paliperidone (INVEGA SUSTENNA) 234 MG/1.5ML SUSY injection Inject 234 mg into the muscle every 28 (twenty-eight) days. Next dose on 07/06/2018 (Patient taking differently: Inject 234  mg into the muscle every 28 (twenty-eight) days. ) 1.8 mL 1   . traZODone (DESYREL) 100 MG tablet Take 100 mg by mouth at bedtime.       Musculoskeletal: Strength & Muscle Tone: within normal limits Gait & Station: normal Patient leans: N/A  Psychiatric Specialty Exam: Physical Exam  Nursing note and vitals reviewed. Constitutional: She appears well-developed and well-nourished.  HENT:  Head: Normocephalic and atraumatic.  Eyes: Pupils are equal, round, and reactive to light. Conjunctivae are normal.  Cardiovascular: Regular rhythm and normal heart sounds.  Respiratory: Effort normal.  GI: Soft.  Musculoskeletal:        General: Normal range of motion.     Cervical back: Normal range of motion.  Neurological: She is alert.  Skin: Skin is warm and dry.     Psychiatric: Judgment normal. Her affect is blunt. Her speech is delayed. She is slowed and withdrawn. Thought content is paranoid and delusional. Cognition and memory are impaired. She exhibits a depressed mood. She expresses suicidal ideation. She expresses no suicidal plans.    Review of Systems  Constitutional: Negative.   HENT: Negative.   Eyes: Negative.   Respiratory: Negative.   Cardiovascular: Negative.   Gastrointestinal: Negative.   Musculoskeletal: Negative.   Skin: Negative.   Neurological: Negative.   Psychiatric/Behavioral: Positive for  behavioral problems, confusion, decreased concentration, dysphoric mood, hallucinations, self-injury, sleep disturbance and suicidal ideas. The patient is nervous/anxious.     Blood pressure (!) 140/98, pulse 81, temperature 98.1 F (36.7 C), temperature source Oral, resp. rate 16, height 5\' 4"  (1.626 m), weight 96.6 kg, last menstrual period 11/22/2019, SpO2 98 %.Body mass index is 36.56 kg/m.  General Appearance: Disheveled  Eye Contact:  Poor  Speech:  Slow  Volume:  Decreased  Mood:  Depressed and Dysphoric  Affect:  Constricted and Flat  Thought Process:  Coherent  Orientation:  Full (Time, Place, and Person)  Thought Content:  Delusions, Hallucinations: Auditory Command:  Reports commands to self injure or hurt others and Paranoid Ideation  Suicidal Thoughts:  Yes.  without intent/plan  Homicidal Thoughts:  No  Memory:  Immediate;   Fair Recent;   Fair Remote;   Fair  Judgement:  Fair  Insight:  Fair  Psychomotor Activity:  Decreased  Concentration:  Concentration: Fair  Recall:  01/20/2020 of Knowledge:  Fair  Language:  Fair  Akathisia:  No  Handed:  Right  AIMS (if indicated):     Assets:  Desire for Improvement Housing Resilience  ADL's:  Impaired  Cognition:  Impaired,  Mild  Sleep:  Number of Hours: 8.25    Treatment Plan Summary: Daily contact with patient to assess and evaluate symptoms and progress in treatment, Medication management and Plan 24 year old woman with schizoaffective disorder presents with command auditory hallucinations depressed mood hopelessness multiple symptoms of major depression.  She is showing reasonably good insight and is cooperative with treatment.  Plan will be to check her clozapine level to see whether we can go up on the dosage of that.  Check lithium level tomorrow while at the same time increasing her lithium dose.  The last lithium levels have been very low.  Patient will get an EKG today.  She will be included in groups and  activities for full treatment team to work with her.  I did not mention ECT with her today but that may be a consideration if symptoms or not getting better soon.  Continue 15-minute checks for now.  Observation Level/Precautions:  15 minute checks  Laboratory:  HCG  Psychotherapy:    Medications:    Consultations:    Discharge Concerns:    Estimated LOS:  Other:     Physician Treatment Plan for Primary Diagnosis: Schizoaffective disorder (HCC) Long Term Goal(s): Improvement in symptoms so as ready for discharge  Short Term Goals: Ability to verbalize feelings will improve, Ability to disclose and discuss suicidal ideas and Ability to demonstrate self-control will improve  Physician Treatment Plan for Secondary Diagnosis: Principal Problem:   Schizoaffective disorder (HCC) Active Problems:   Suicidal ideation   Obesity  Long Term Goal(s): Improvement in symptoms so as ready for discharge  Short Term Goals: Ability to maintain clinical measurements within normal limits will improve and Compliance with prescribed medications will improve  I certify that inpatient services furnished can reasonably be expected to improve the patient's condition.    Mordecai Rasmussen, MD 2/19/202110:21 AM

## 2019-12-01 NOTE — Progress Notes (Signed)
Admission Note:  24 yr female who presents Voluntary Commitment in no acute distress for the treatment of SI and  AH; she stated ' the voices ask me to kill myself" patient contracts for safety upon arrival.  Patient's  affect is blunted and she was anxious and restless but was later calm and cooperative with admission process. Patient endorses AVH but denies HI. Patient is from a group home she believes her mother's recent death in 02/08/2018 is a stressor. Patient has Past medical Hx of Suicidal Ideation, Depression Substance Abuse, Bipolar Disorder. Patient appears disheveled  poor hygiene, skin was assessed and found to be warm, dry and intact, she was searched and no contraband found, POC and unit policies explained and understanding verbalized and  Consents obtained. Food and fluids offered, and both accepted 15 minutes safety checks maintained

## 2019-12-02 DIAGNOSIS — F250 Schizoaffective disorder, bipolar type: Secondary | ICD-10-CM

## 2019-12-02 LAB — PROLACTIN: Prolactin: 98.4 ng/mL — ABNORMAL HIGH (ref 4.8–23.3)

## 2019-12-02 LAB — LITHIUM LEVEL: Lithium Lvl: 0.39 mmol/L — ABNORMAL LOW (ref 0.60–1.20)

## 2019-12-02 MED ORDER — BROMOCRIPTINE MESYLATE 2.5 MG PO TABS
1.2500 mg | ORAL_TABLET | Freq: Every day | ORAL | Status: DC
  Administered 2019-12-02: 1.25 mg via ORAL
  Filled 2019-12-02 (×2): qty 1

## 2019-12-02 MED ORDER — FENOFIBRATE 160 MG PO TABS
160.0000 mg | ORAL_TABLET | Freq: Every day | ORAL | Status: DC
  Administered 2019-12-02 – 2019-12-06 (×5): 160 mg via ORAL
  Filled 2019-12-02 (×5): qty 1

## 2019-12-02 MED ORDER — HALOPERIDOL 5 MG PO TABS
5.0000 mg | ORAL_TABLET | Freq: Two times a day (BID) | ORAL | Status: DC
  Administered 2019-12-02 – 2019-12-03 (×2): 5 mg via ORAL
  Filled 2019-12-02 (×2): qty 1

## 2019-12-02 MED ORDER — ZIPRASIDONE MESYLATE 20 MG IM SOLR: 20.0000 mg | Freq: Four times a day (QID) | INTRAMUSCULAR | Status: DC | PRN

## 2019-12-02 NOTE — Progress Notes (Signed)
Patient is alert ad oriented x 4, affect is blunted, no distress noted her interaction is childlike at times, it appears she no insight into mental health treatment, she ws noted isolative this shift no  Interaction with peers. Patient currently denies SI/HI but endorse AH, but she denies command hallucination. Patient was complaint with medication this shift. 15 minutes safety checks maintained will continue to monitor.

## 2019-12-02 NOTE — Plan of Care (Signed)
  Problem: Education: Goal: Knowledge of [facility] Education information/materials will improve Outcome: Progressing Goal: Emotional status will improve Outcome: Progressing Goal: Mental status will improve Outcome: Progressing Goal: Verbalization of understanding the information provided will improve Outcome: Progressing  D: Patient admits to hearing voices and says that "some bitch has stolen my identity". She says she was screaming earlier when she was hearing voices. Says she is having passive SI but can contract for safety. Otherwise pleasant and cooperative. A: Continue to monitor for safety R: Safety maintained.

## 2019-12-02 NOTE — Progress Notes (Signed)
[facility] MD Progress Note  12/02/2019 10:43 AM Kimberly Boyle  MRN:  [id number] Subjective: Patient is a 24 year old female with a longstanding past psychiatric history significant for schizoaffective disorder who presented to the [facility] emergency department on 11/29/2019 with suicidal ideation.  The patient stated that that time she was going to cut herself when her caregiver went to sleep.  Objective: Patient is seen and examined.  Patient is a 24 year old female with the above-stated past psychiatric history who is seen in follow-up.  She was admitted to the psychiatric unit on 12/01/2019 secondary to auditory hallucinations and suicidal ideation.  She does reside in a group home.  Apparently she has had multiple emergency room visits due to self injury and voicing suicidal ideation.  On examination today she stated that she is essentially suicidal always.  Initially they watched her in the emergency department, and the hope was to be able to send her back to the group home.  Unfortunately this was not successful and she required inpatient admission.  This morning she remains in bed, has poor eye contact, but stated she was still having auditory hallucinations and suicidal ideation.  Her current medications include clonazepam 0.5 mg p.o. twice daily, clozapine 400 mg p.o. nightly, haloperidol 2 mg p.o. twice daily, and lithium carbonate 600 mg p.o. twice daily.  It looks like her lithium dosage was increased from her outpatient dosage.  Her lithium level on admission was 0.39.  Her prolactin was elevated at 98.4.  TSH was normal, pregnancy test was negative.  Drug screen was positive for MDMA, but the patient denied any drug use.  Her EKG showed a normal sinus rhythm with a normal QTc interval.  CBC was completely normal.  Her electrolytes and liver function enzymes were normal.  Her lipid panel showed an elevated triglyceride at 236.  Her platelets were mildly elevated at 471,000.   This a.m. her blood pressure is mildly elevated at 140/98.  She is afebrile.  Nursing notes reflect that she slept 8.25 hours.  Principal Problem: Schizoaffective disorder ([place]) Diagnosis: Principal Problem:   Schizoaffective disorder ([place]) Active Problems:   Suicidal ideation   Obesity  Total Time spent with patient: 30 minutes  Past Psychiatric History: See admission H&P  Past Medical History:  Past Medical History:  Diagnosis Date  . Anxiety   . Arthritis   . Bipolar disorder ([place])   . Depression   . Hidradenitis suppurativa 06/22/2019   Groin   . Major depressive disorder   . Panic disorder   . PID (acute pelvic inflammatory disease) 06/22/2019   06/22/19   . Poor historian 06/22/2019  . Schizophrenia [facility])     Past Surgical History:  Procedure Laterality Date  . COLONOSCOPY WITH PROPOFOL N/A 09/29/2019   Procedure: COLONOSCOPY WITH PROPOFOL;  Surgeon: Lin Landsman, MD;  Location: [facility] ENDOSCOPY;  Service: Gastroenterology;  Laterality: N/A;  . ESOPHAGOGASTRODUODENOSCOPY (EGD) WITH PROPOFOL N/A 09/29/2019   Procedure: ESOPHAGOGASTRODUODENOSCOPY (EGD) WITH PROPOFOL;  Surgeon: Lin Landsman, MD;  Location: [facility] ENDOSCOPY;  Service: Gastroenterology;  Laterality: N/A;   Family History:  Family History  Problem Relation Age of Onset  . Post-traumatic stress disorder Mother   . Depression Mother   . Depression Maternal Uncle   . Cancer Maternal Grandmother    Family Psychiatric  History: See admission H&P Social History:  Social History   Substance and Sexual Activity  Alcohol Use No     Social History   Substance and  Sexual Activity  Drug Use Not Currently  . Types: Marijuana    Social History   Socioeconomic History  . Marital status: Single    Spouse name: Not on file  . Number of children: 0  . Years of education: Not on file  . Highest education level: High school graduate  Occupational History    Comment: unemployed  Tobacco Use  .  Smoking status: Current Every Day Smoker    Packs/day: 0.50    Years: 11.00    Pack years: 5.50    Types: Cigarettes  . Smokeless tobacco: Never Used  Substance and Sexual Activity  . Alcohol use: No  . Drug use: Not Currently    Types: Marijuana  . Sexual activity: Not Currently  Other Topics Concern  . Not on file  Social History Narrative   ** Merged History Encounter **       Social Determinants of Health   Financial Resource Strain:   . Difficulty of Paying Living Expenses: Not on file  Food Insecurity:   . Worried About Programme researcher, broadcasting/film/video in the Last Year: Not on file  . Ran Out of Food in the Last Year: Not on file  Transportation Needs:   . Lack of Transportation (Medical): Not on file  . Lack of Transportation (Non-Medical): Not on file  Physical Activity:   . Days of Exercise per Week: Not on file  . Minutes of Exercise per Session: Not on file  Stress:   . Feeling of Stress : Not on file  Social Connections:   . Frequency of Communication with Friends and Family: Not on file  . Frequency of Social Gatherings with Friends and Family: Not on file  . Attends Religious Services: Not on file  . Active Member of Clubs or Organizations: Not on file  . Attends Banker Meetings: Not on file  . Marital Status: Not on file   Additional Social History:                         Sleep: Good  Appetite:  Good  Current Medications: Current Facility-Administered Medications  Medication Dose Route Frequency Provider Last Rate Last Admin  . alum & mag hydroxide-simeth (MAALOX/MYLANTA) 200-200-20 MG/5ML suspension 30 mL  30 mL Oral Q4H PRN Cristofano, Worthy Rancher, MD      . clonazePAM (KLONOPIN) tablet 0.5 mg  0.5 mg Oral BID PRN Cristofano, Worthy Rancher, MD   0.5 mg at 12/01/19 2156  . cloZAPine (CLOZARIL) tablet 400 mg  400 mg Oral QHS Cristofano, Worthy Rancher, MD   400 mg at 12/01/19 2156  . docusate sodium (COLACE) capsule 100 mg  100 mg Oral BID [name], [name],  MD   100 mg at 12/02/19 0855  . haloperidol (HALDOL) tablet 2 mg  2 mg Oral BID [name], [name], MD   2 mg at 12/02/19 0855  . ipratropium (ATROVENT) 0.06 % nasal spray 2 spray  2 spray Each Nare QHS [name], [name], MD   2 spray at 12/01/19 2157  . lithium carbonate capsule 600 mg  600 mg Oral BID [name], [name], MD   600 mg at 12/02/19 0855  . magnesium hydroxide (MILK OF MAGNESIA) suspension 30 mL  30 mL Oral Daily PRN Cristofano, Worthy Rancher, MD        Lab Results:  Results for orders placed or performed during the hospital encounter of 11/30/19 (from the past 48 hour(s))  Hepatic function panel     Status: None   Collection Time: 11/30/19  8:16 PM  Result Value Ref Range   Total Protein 6.6 6.5 - 8.1 g/dL   Albumin 3.7 3.5 - 5.0 g/dL   AST 27 15 - 41 U/L   ALT 28 0 - 44 U/L   Alkaline Phosphatase 70 38 - 126 U/L   Total Bilirubin 0.4 0.3 - 1.2 mg/dL   Bilirubin, Direct <3.5 0.0 - 0.2 mg/dL   Indirect Bilirubin NOT CALCULATED 0.3 - 0.9 mg/dL    Comment: Performed at [facility], [address] [place], [place] [address]  Lipid panel     Status: Abnormal   Collection Time: 11/30/19  8:16 PM  Result Value Ref Range   Cholesterol 155 0 - 200 mg/dL   Triglycerides 127 (H) <150 mg/dL   HDL 30 (L) >51 mg/dL   Total CHOL/HDL Ratio 5.2 RATIO   VLDL 47 (H) 0 - 40 mg/dL   LDL Cholesterol 78 0 - 99 mg/dL    Comment:        Total Cholesterol/HDL:CHD Risk Coronary Heart Disease Risk Table                     Men   Women  1/2 Average Risk   3.4   3.3  Average Risk       5.0   4.4  2 X Average Risk   9.6   7.1  3 X Average Risk  23.4   11.0        Use the calculated Patient Ratio above and the CHD Risk Table to determine the patient's CHD Risk.        ATP III CLASSIFICATION (LDL):  <100     mg/dL   Optimal  700-174  mg/dL   Near or Above                    Optimal  130-159  mg/dL   Borderline  944-967  mg/dL   High  >591     mg/dL   Very High Performed at [facility], [address] [place], [place] [address]   Prolactin     Status: Abnormal   Collection Time: 11/30/19  8:16 PM  Result Value Ref Range   Prolactin 98.4 (H) 4.8 - 23.3 ng/mL    Comment: (NOTE) Performed At: [facility] [address] [place], [place] [id number] Jolene Schimke MD XB:[id number]   TSH     Status: None   Collection Time: 11/30/19  8:16 PM  Result Value Ref Range   TSH 3.273 0.350 - 4.500 uIU/mL    Comment: Performed by a 3rd Generation assay with a functional sensitivity of <=0.01 uIU/mL. Performed at [facility], [address] [place], [place] [address]   CBC with Differential/Platelet     Status: Abnormal   Collection Time: 11/30/19  8:17 PM  Result Value Ref Range   WBC 9.4 4.0 - 10.5 K/uL   RBC 4.24 3.87 - 5.11 MIL/uL   Hemoglobin 12.3 12.0 - 15.0 g/dL   HCT 22.6 33.3 - 54.5 %   MCV 90.6 80.0 - 100.0 fL   MCH 29.0 26.0 - 34.0 pg   MCHC 32.0 30.0 - 36.0 g/dL   RDW 62.5 63.8 - 93.7 %   Platelets 471 (H) 150 - 400 K/uL   nRBC 0.0 0.0 - 0.2 %   Neutrophils Relative % 63 %  Neutro Abs 5.9 1.7 - 7.7 K/uL   Lymphocytes Relative 28 %   Lymphs Abs 2.6 0.7 - 4.0 K/uL   Monocytes Relative 9 %   Monocytes Absolute 0.8 0.1 - 1.0 K/uL   Eosinophils Relative 0 %   Eosinophils Absolute 0.0 0.0 - 0.5 K/uL   Basophils Relative 0 %   Basophils Absolute 0.0 0.0 - 0.1 K/uL   Immature Granulocytes 0 %   Abs Immature Granulocytes 0.04 0.00 - 0.07 K/uL    Comment: Performed at [facility], [address] [place], [place] [address]  Lithium level     Status: Abnormal   Collection Time: 12/02/19  6:44 AM  Result Value Ref Range   Lithium Lvl 0.39 (L) 0.60 - 1.20 mmol/L    Comment: Performed at [facility], [address] [place], [place] [address]    Blood Alcohol level:  Lab Results  Component Value Date   [facility] <10 11/29/2019   ETH <10 11/21/2019    Metabolic Disorder Labs: Lab Results  Component Value  Date   HGBA1C 5.1 05/05/2018   MPG 99.67 05/05/2018   Lab Results  Component Value Date   PROLACTIN 98.4 (H) 11/30/2019   PROLACTIN 132.8 (H) 10/25/2017   Lab Results  Component Value Date   CHOL 155 11/30/2019   TRIG 236 (H) 11/30/2019   HDL 30 (L) 11/30/2019   CHOLHDL 5.2 11/30/2019   VLDL 47 (H) 11/30/2019   LDLCALC 78 11/30/2019   LDLCALC 124 (H) 05/07/2018    Physical Findings: AIMS:  , ,  ,  ,    CIWA:    COWS:     Musculoskeletal: Strength & Muscle Tone: within normal limits Gait & Station: normal Patient leans: N/A  Psychiatric Specialty Exam: Physical Exam  Nursing note and vitals reviewed. Constitutional: She is oriented to person, place, and time. She appears well-developed and well-nourished.  HENT:  Head: Normocephalic and atraumatic.  Respiratory: Effort normal.  Neurological: She is alert and oriented to person, place, and time.    Review of Systems  Blood pressure (!) 140/98, pulse 81, temperature 98.1 F (36.7 C), temperature source Oral, resp. rate 16, height 5\' 4"  (1.626 m), weight 96.6 kg, last menstrual period 11/22/2019, SpO2 98 %.Body mass index is 36.56 kg/m.  General Appearance: Disheveled  Eye Contact:  Minimal  Speech:  Normal Rate  Volume:  Normal  Mood:  Dysphoric  Affect:  Flat  Thought Process:  Goal Directed and Descriptions of Associations: Circumstantial  Orientation:  Full (Time, Place, and Person)  Thought Content:  Delusions and Hallucinations: Auditory  Suicidal Thoughts:  Yes.  without intent/plan  Homicidal Thoughts:  No  Memory:  Immediate;   Fair Recent;   Fair Remote;   Fair  Judgement:  Impaired  Insight:  Lacking  Psychomotor Activity:  Normal  Concentration:  Concentration: Fair and Attention Span: Fair  Recall:  01/20/2020 of Knowledge:  Fair  Language:  Fair  Akathisia:  Negative  Handed:  Right  AIMS (if indicated):     Assets:  Desire for Improvement Resilience  ADL's:  Intact  Cognition:  WNL   Sleep:  Number of Hours: 8.25     Treatment Plan Summary: Daily contact with patient to assess and evaluate symptoms and progress in treatment, Medication management and Plan : Patient is seen and examined.  Patient is a 24 year old female with the above-stated past psychiatric history who is seen in follow-up.   Diagnosis: #1 schizoaffective  disorder; bipolar type versus schizophrenia, #2 hyperprolactinemia, #3 elevated blood pressure this a.m., #4 hypertriglyceridemia  Patient is seen in follow-up.  Review of the electronic medical record revealed at least in December 2020 she had apparently been on [place] sustain a in the past.  It looks like the date on starting that medication was 07/06/2018.  The patient was also taking fenofibrate, Clozaril, Klonopin and Bentyl.  It does not appear that she has been continued on the paliperidone long-acting injection.  Her lithium was increased on admission.  Hopefully that will have benefit.  I will not change other dosages today except readding the fenofibrate as well as the Cogentin but making the Cogentin as needed only.  The elevated prolactin may be secondary to her long-acting Invega injection and as well the Clozaril but may also be secondary to a prolactinoma.  A brief review of the electronic medical record did reveal an elevated prolactin 2 years ago at 132.8.  The only way to really make the diagnosis of a prolactinoma would be to get an MRI.  I am not sure in her current state that she could tolerate an MRI.  She appears to have been on multiple medications in the past, and it is unlikely to be able to be successful by stopping the Clozaril.  I will add bromocriptine and see if it has any effect from a clinical standpoint.  Hopefully it could decrease some of the fatigue from her elevated prolactin.  No other changes today.  1.  Continue clonazepam 0.5 mg p.o. twice daily as needed anxiety. 2.  Continue Clozaril 400 mg p.o. nightly for psychosis. 3.   Continue haloperidol 2 mg p.o. twice daily for psychosis. 4.  Continue Atrovent nasal spray 2 sprays in each nostril at bedtime. 5.  Continue lithium carbonate 600 mg p.o. twice daily for mood stability. 6.  Add fenofibrate 145 mg p.o. daily for hypertriglyceridemia. 7.  Add bromocriptine 0.5 mg p.o. nightly for hyperprolactinemia. 8.  Consideration of MRI prior to discharge when mentation is more stable. 9.  Readd Cogentin 0.5 mg p.o. twice daily as needed EPS symptoms. 10.  Disposition planning-in progress. Antonieta Pert, MD 12/02/2019, 10:43 AM

## 2019-12-02 NOTE — Tx Team (Signed)
Interdisciplinary Treatment and Diagnostic Plan Update  12/02/2019 Time of Session: 8:45am Kimberly Boyle MRN: [id number]  Principal Diagnosis: Schizoaffective disorder [facility])  Secondary Diagnoses: Principal Problem:   Schizoaffective disorder ([place]) Active Problems:   Suicidal ideation   Obesity   Current Medications:  Current Facility-Administered Medications  Medication Dose Route Frequency Provider Last Rate Last Admin  . alum & mag hydroxide-simeth (MAALOX/MYLANTA) 200-200-20 MG/5ML suspension 30 mL  30 mL Oral Q4H PRN Cristofano, Dorene Ar, MD      . clonazePAM (KLONOPIN) tablet 0.5 mg  0.5 mg Oral BID PRN Cristofano, Dorene Ar, MD   0.5 mg at 12/01/19 2156  . cloZAPine (CLOZARIL) tablet 400 mg  400 mg Oral QHS Cristofano, Dorene Ar, MD   400 mg at 12/01/19 2156  . docusate sodium (COLACE) capsule 100 mg  100 mg Oral BID [name], [name], MD   100 mg at 12/02/19 0855  . haloperidol (HALDOL) tablet 2 mg  2 mg Oral BID [name], Madie Reno, MD   2 mg at 12/02/19 0855  . ipratropium (ATROVENT) 0.06 % nasal spray 2 spray  2 spray Each Nare QHS [name], Madie Reno, MD   2 spray at 12/01/19 2157  . lithium carbonate capsule 600 mg  600 mg Oral BID [name], Madie Reno, MD   600 mg at 12/02/19 0855  . magnesium hydroxide (MILK OF MAGNESIA) suspension 30 mL  30 mL Oral Daily PRN Cristofano, Dorene Ar, MD       PTA Medications: Medications Prior to Admission  Medication Sig Dispense Refill Last Dose  . benztropine (COGENTIN) 1 MG tablet Take 1 mg by mouth at bedtime.     . busPIRone (BUSPAR) 10 MG tablet Take 10 mg by mouth 2 (two) times daily.     . clonazePAM (KLONOPIN) 0.5 MG tablet Take 1 tablet (0.5 mg total) by mouth 2 (two) times daily as needed (anxiety). 60 tablet 0   . cloZAPine (CLOZARIL) 100 MG tablet Take 400 mg by mouth at bedtime.  5   . dicyclomine (BENTYL) 10 MG capsule Take 1 capsule (10 mg total) by mouth 4 (four) times daily -  before meals and at bedtime. 120 capsule 0   . fenofibrate  (TRICOR) 145 MG tablet Take 145 mg by mouth daily.     Marland Kitchen lithium carbonate 300 MG capsule Take 300 mg by mouth 2 (two) times daily.     Marland Kitchen omeprazole (PRILOSEC) 40 MG capsule Take 1 capsule (40 mg total) by mouth 2 (two) times daily before a meal. 60 capsule 2   . paliperidone (INVEGA SUSTENNA) 234 MG/1.5ML SUSY injection Inject 234 mg into the muscle every 28 (twenty-eight) days. Next dose on 07/06/2018 (Patient taking differently: Inject 234 mg into the muscle every 28 (twenty-eight) days. ) 1.8 mL 1   . traZODone (DESYREL) 100 MG tablet Take 100 mg by mouth at bedtime.       Patient Stressors: Marital or family conflict Medication change or noncompliance  Patient Strengths: Motivation for treatment/growth Supportive family/friends  Treatment Modalities: Medication Management, Group therapy, Case management,  1 to 1 session with clinician, Psychoeducation, Recreational therapy.   Physician Treatment Plan for Primary Diagnosis: Schizoaffective disorder ([place]) Long Term Goal(s): Improvement in symptoms so as ready for discharge Improvement in symptoms so as ready for discharge   Short Term Goals: Ability to verbalize feelings will improve Ability to disclose and discuss suicidal ideas Ability to demonstrate self-control will improve Ability to maintain clinical measurements within normal limits  will improve Compliance with prescribed medications will improve  Medication Management: Evaluate patient's response, side effects, and tolerance of medication regimen.  Therapeutic Interventions: 1 to 1 sessions, Unit Group sessions and Medication administration.  Evaluation of Outcomes: Progressing  Physician Treatment Plan for Secondary Diagnosis: Principal Problem:   Schizoaffective disorder ([facility]) Active Problems:   Suicidal ideation   Obesity  Long Term Goal(s): Improvement in symptoms so as ready for discharge Improvement in symptoms so as ready for discharge   Short Term Goals:  Ability to verbalize feelings will improve Ability to disclose and discuss suicidal ideas Ability to demonstrate self-control will improve Ability to maintain clinical measurements within normal limits will improve Compliance with prescribed medications will improve     Medication Management: Evaluate patient's response, side effects, and tolerance of medication regimen.  Therapeutic Interventions: 1 to 1 sessions, Unit Group sessions and Medication administration.  Evaluation of Outcomes: Progressing   RN Treatment Plan for Primary Diagnosis: Schizoaffective disorder ([facility]) Long Term Goal(s): Knowledge of disease and therapeutic regimen to maintain health will improve  Short Term Goals: Ability to remain free from injury will improve, Ability to verbalize frustration and anger appropriately will improve, Ability to demonstrate self-control, Ability to participate in decision making will improve, Ability to verbalize feelings will improve, Ability to disclose and discuss suicidal ideas, Ability to identify and develop effective coping behaviors will improve and Compliance with prescribed medications will improve  Medication Management: RN will administer medications as ordered by provider, will assess and evaluate patient's response and provide education to patient for prescribed medication. RN will report any adverse and/or side effects to prescribing provider.  Therapeutic Interventions: 1 on 1 counseling sessions, Psychoeducation, Medication administration, Evaluate responses to treatment, Monitor vital signs and CBGs as ordered, Perform/monitor CIWA, COWS, AIMS and Fall Risk screenings as ordered, Perform wound care treatments as ordered.  Evaluation of Outcomes: Progressing   LCSW Treatment Plan for Primary Diagnosis: Schizoaffective disorder (HCC) Long Term Goal(s): Safe transition to appropriate next level of care at discharge, Engage patient in therapeutic group addressing  interpersonal concerns.  Short Term Goals: Engage patient in aftercare planning with referrals and resources, Increase social support, Increase ability to appropriately verbalize feelings, Increase emotional regulation, Facilitate acceptance of mental health diagnosis and concerns, Identify triggers associated with mental health/substance abuse issues and Increase skills for wellness and recovery  Therapeutic Interventions: Assess for all discharge needs, 1 to 1 time with Social worker, Explore available resources and support systems, Assess for adequacy in community support network, Educate family and significant other(s) on suicide prevention, Complete Psychosocial Assessment, Interpersonal group therapy.  Evaluation of Outcomes: Progressing   Progress in Treatment: Attending groups: No. Participating in groups: No. Taking medication as prescribed: Yes. Toleration medication: Yes. Family/Significant other contact made: No, will contact:  Pt will identify a contact Patient understands diagnosis: Yes. Discussing patient identified problems/goals with staff: Yes. Medical problems stabilized or resolved: Yes. Denies suicidal/homicidal ideation: Yes. Issues/concerns per patient self-inventory: No. Other: None  New problem(s) identified: No, Describe:  None  New Short Term/Long Term Goal(s):  Patient Goals:  "not sure"  Discharge Plan or Barriers: Pt plans to be discharged back home and to be connected to community services for follow up services for after care.   Reason for Continuation of Hospitalization: Medication stabilization Suicidal ideation  Estimated Length of Stay: 3-5 days  Attendees: Patient: Kimberly Boyle 12/02/2019   Physician: Dr. Jola Babinski 12/02/2019   Nursing: Addison Naegeli, RN 12/02/2019   RN Care  Manager: 12/02/2019   Social Worker: [place], [place] 12/02/2019   Recreational Therapist:  12/02/2019   Other:  12/02/2019   Other:  12/02/2019   Other: 12/02/2019      Scribe for Treatment Team: Jimmey Ralph, Theresia Majors 12/02/2019 9:47 AM

## 2019-12-02 NOTE — Plan of Care (Signed)
  Problem: Education: Goal: Ability to make informed decisions regarding treatment will improve Outcome: Progressing  Patient denies SI/HI/AVH.Marland Kitchen

## 2019-12-02 NOTE — [facility] Group Notes (Signed)
LCSW Group Therapy Note  12/02/2019 1:00pm  Type of Therapy and Topic:  Group Therapy:  Cognitive Distortions  Participation Level:  Did Not Attend   Description of Group:    Patients in this group will be introduced to the topic of cognitive distortions.  Patients will identify and describe cognitive distortions, describe the feelings these distortions create for them.  Patients will identify one or more situations in their personal life where they have cognitively distorted thinking and will verbalize challenging this cognitive distortion through positive thinking skills.  Patients will practice the skill of using positive affirmations to challenge cognitive distortions using affirmation cards.    Therapeutic Goals:  1. Patient will identify two or more cognitive distortions they have used 2. Patient will identify one or more emotions that stem from use of a cognitive distortion 3. Patient will demonstrate use of a positive affirmation to counter a cognitive distortion through discussion and/or role play. 4. Patient will describe one way cognitive distortions can be detrimental to wellness   Summary of Patient Progress:  X   Therapeutic Modalities:   Cognitive Behavioral Therapy Motivational Interviewing   Teresita Madura, MSW, [facility] Clinical Social Worker  12/02/2019 2:22 PM

## 2019-12-02 NOTE — Progress Notes (Signed)
D: Patient admits to hearing voices and says that "some bitch has stolen my identity". She says she was screaming earlier when she was hearing voices. Says she is having passive SI but can contract for safety. Otherwise pleasant and cooperative. A: Continue to monitor for safety R: Safety maintained.

## 2019-12-02 NOTE — Plan of Care (Signed)
Patient denies SI/HI/AVH. Patient is isolated to room, patient is asked multiple times to report to medication room but refuses. Patient denies eating breakfast this morning. Patient is adherent with scheduled medications. Patient safety is maintained on the unit with frequent rounding.    Problem: Education: Goal: Knowledge of [facility] Education information/materials will improve Outcome: Not Progressing Goal: Mental status will improve Outcome: Not Progressing Goal: Verbalization of understanding the information provided will improve Outcome: Not Progressing

## 2019-12-03 DIAGNOSIS — F251 Schizoaffective disorder, depressive type: Secondary | ICD-10-CM

## 2019-12-03 MED ORDER — HALOPERIDOL 5 MG PO TABS
7.5000 mg | ORAL_TABLET | Freq: Two times a day (BID) | ORAL | Status: DC
  Administered 2019-12-03 – 2019-12-06 (×6): 7.5 mg via ORAL
  Filled 2019-12-03 (×6): qty 2

## 2019-12-03 MED ORDER — CLONAZEPAM 0.5 MG PO TABS
0.5000 mg | ORAL_TABLET | Freq: Three times a day (TID) | ORAL | Status: DC | PRN
  Administered 2019-12-03 – 2019-12-05 (×4): 0.5 mg via ORAL
  Filled 2019-12-03 (×4): qty 1

## 2019-12-03 MED ORDER — BROMOCRIPTINE MESYLATE 2.5 MG PO TABS
2.5000 mg | ORAL_TABLET | Freq: Every day | ORAL | Status: DC
  Administered 2019-12-03 – 2019-12-05 (×3): 2.5 mg via ORAL
  Filled 2019-12-03 (×4): qty 1

## 2019-12-03 NOTE — Progress Notes (Signed)
Pt. At this time endorsing anxiousness, requesting PRN medicine for comfort. Pt. Given PRN medicine for comfort and safety of the patient.

## 2019-12-03 NOTE — [facility] Group Notes (Signed)
[facility] Group Notes:  (Nursing/MHT/Case Management/Adjunct)  Date:  12/03/2019  Time:  3:27 PM  Type of Therapy:  Communication Group  Participation Level:  Did Not Attend  Participation Quality:    Affect:    Cognitive:    Insight:    Engagement in Group:    Modes of Intervention:    Summary of Progress/Problems:  Kimberly Boyle 12/03/2019, 3:27 PM

## 2019-12-03 NOTE — [facility] Group Notes (Signed)
[facility] Group Notes:  (Nursing/MHT/Case Management/Adjunct)  Date:  12/03/2019  Time:  11:28 AM  Type of Therapy:  Psychoeducational Skills  Participation Level:  Did Not Attend  Summary of Progress/Problems:  Kimberly Boyle 12/03/2019, 11:28 AM

## 2019-12-03 NOTE — Plan of Care (Signed)
D: Pt. During assessments this morning is observed in bed resting. Pt. Upon engagement is notably disheveled and eye contact is brief. Pt. Describes an irritable and anxious mood. Pt. Thought process is circumstantial. Pt. Overall engagement is minimal. Pt. Forwards little. Pt. Denies pain. Pt. Verbalizes feeling safe on the unit, but endorses suicidal thoughts with no specific intent or plans endorsed. Pt. Continues to endorse auditory hallucinations, but does not give descriptors. Pt. Denies VH.   A: Q x 15 minute observation checks in place/maintained for safety. Patient is provided with education throughout shift when appropriate and able. Patient is given/offered medications per orders. Patient is encouraged to attend groups, participate in unit activities and continue with plan of care. Pt. Chart and plans of care reviewed. Pt. Given support and encouragement when appropriate and able.    R: Patient is complaint with medications, but resistant to some of the units procedures. Pt. Observed eating good when up for meals. Pt. Spent a majority of the day isolative and withdrawn to room laying in bed, but did spend more time outside her room in the evening, and even took a shower.    Problem: Education: Goal: Emotional status will improve Outcome: Not Progressing Goal: Mental status will improve Outcome: Not Progressing   Problem: Safety: Goal: Periods of time without injury will increase Outcome: Progressing   Problem: Self-Concept: Goal: Ability to disclose and discuss suicidal ideas will improve Outcome: Progressing Goal: Will verbalize positive feelings about self Outcome: Not Progressing

## 2019-12-03 NOTE — Progress Notes (Addendum)
[facility] MD Progress Note  12/03/2019 10:12 AM Kimberly Boyle  MRN:  [id number] Subjective:  Patient is a 24 year old female with a longstanding past psychiatric history significant for schizoaffective disorder who presented to the [facility] emergency department on 11/29/2019 with suicidal ideation.  The patient stated that that time she was going to cut herself when her caregiver went to sleep.  Objective: Patient is seen and examined.  Patient is a 24 year old female with the above-stated past psychiatric history who is seen in follow-up.  She continues to have intermittent outbursts.  She will just yell, and act agitated, but seems to be easily redirectable.  She did get so upset yesterday there was consideration of giving her the Geodon injection.  I did end up increasing her Haldol to 5 mg p.o. twice daily.  This morning again she remains in bed.  She states she is still hearing voices.  She is not really agitated by these currently.  She stated that the group home did not send her here, but that she asked to come here because of suicidal thinking.  I asked her about the last time that she received the long-acting Invega injection, and she is could not tell me when, but stated she was no longer getting it because "the shot hurt so much".  As stated yesterday, her prolactin level was elevated at 98.4.  She did still continue to have suicidal thoughts, but stated these have been present for at least 5 years.  She refused her vital signs this morning.  Nursing notes reflect that she slept 8 hours last night.  Her only side effect medication mentioned was fatigue.  No new laboratories.  Principal Problem: Schizoaffective disorder (HCC) Diagnosis: Principal Problem:   Schizoaffective disorder (HCC) Active Problems:   Suicidal ideation   Obesity  Total Time spent with patient: 20 minutes  Past Psychiatric History: See admission H&P  Past Medical History:  Past Medical History:   Diagnosis Date  . Anxiety   . Arthritis   . Bipolar disorder (HCC)   . Depression   . Hidradenitis suppurativa 06/22/2019   Groin   . Major depressive disorder   . Panic disorder   . PID (acute pelvic inflammatory disease) 06/22/2019   06/22/19   . Poor historian 06/22/2019  . Schizophrenia [facility])     Past Surgical History:  Procedure Laterality Date  . COLONOSCOPY WITH PROPOFOL N/A 09/29/2019   Procedure: COLONOSCOPY WITH PROPOFOL;  Surgeon: Toney Reil, MD;  Location: [facility] ENDOSCOPY;  Service: Gastroenterology;  Laterality: N/A;  . ESOPHAGOGASTRODUODENOSCOPY (EGD) WITH PROPOFOL N/A 09/29/2019   Procedure: ESOPHAGOGASTRODUODENOSCOPY (EGD) WITH PROPOFOL;  Surgeon: Toney Reil, MD;  Location: [facility] ENDOSCOPY;  Service: Gastroenterology;  Laterality: N/A;   Family History:  Family History  Problem Relation Age of Onset  . Post-traumatic stress disorder Mother   . Depression Mother   . Depression Maternal Uncle   . Cancer Maternal Grandmother    Family Psychiatric  History: See admission H&P Social History:  Social History   Substance and Sexual Activity  Alcohol Use No     Social History   Substance and Sexual Activity  Drug Use Not Currently  . Types: Marijuana    Social History   Socioeconomic History  . Marital status: Single    Spouse name: Not on file  . Number of children: 0  . Years of education: Not on file  . Highest education level: High school graduate  Occupational History  Comment: unemployed  Tobacco Use  . Smoking status: Current Every Day Smoker    Packs/day: 0.50    Years: 11.00    Pack years: 5.50    Types: Cigarettes  . Smokeless tobacco: Never Used  Substance and Sexual Activity  . Alcohol use: No  . Drug use: Not Currently    Types: Marijuana  . Sexual activity: Not Currently  Other Topics Concern  . Not on file  Social History Narrative   ** Merged History Encounter **       Social Determinants of Health    Financial Resource Strain:   . Difficulty of Paying Living Expenses: Not on file  Food Insecurity:   . Worried About Charity fundraiser in the Last Year: Not on file  . Ran Out of Food in the Last Year: Not on file  Transportation Needs:   . Lack of Transportation (Medical): Not on file  . Lack of Transportation (Non-Medical): Not on file  Physical Activity:   . Days of Exercise per Week: Not on file  . Minutes of Exercise per Session: Not on file  Stress:   . Feeling of Stress : Not on file  Social Connections:   . Frequency of Communication with Friends and Family: Not on file  . Frequency of Social Gatherings with Friends and Family: Not on file  . Attends Religious Services: Not on file  . Active Member of Clubs or Organizations: Not on file  . Attends Archivist Meetings: Not on file  . Marital Status: Not on file   Additional Social History:                         Sleep: Good  Appetite:  Good  Current Medications: Current Facility-Administered Medications  Medication Dose Route Frequency Provider Last Rate Last Admin  . alum & mag hydroxide-simeth (MAALOX/MYLANTA) 200-200-20 MG/5ML suspension 30 mL  30 mL Oral Q4H PRN Cristofano, Dorene Ar, MD   30 mL at 12/02/19 2004  . bromocriptine (PARLODEL) tablet 1.25 mg  1.25 mg Oral QHS Sharma Covert, MD   1.25 mg at 12/02/19 2114  . clonazePAM (KLONOPIN) tablet 0.5 mg  0.5 mg Oral BID PRN Dixie Dials, MD   0.5 mg at 12/03/19 2130  . cloZAPine (CLOZARIL) tablet 400 mg  400 mg Oral QHS Cristofano, Dorene Ar, MD   400 mg at 12/02/19 2114  . docusate sodium (COLACE) capsule 100 mg  100 mg Oral BID [name], Madie Reno, MD   100 mg at 12/03/19 8657  . fenofibrate tablet 160 mg  160 mg Oral Daily Sharma Covert, MD   160 mg at 12/03/19 8469  . haloperidol (HALDOL) tablet 5 mg  5 mg Oral BID Sharma Covert, MD   5 mg at 12/03/19 6295  . ipratropium (ATROVENT) 0.06 % nasal spray 2 spray  2 spray Each  Nare QHS [name], Madie Reno, MD   2 spray at 12/02/19 2121  . lithium carbonate capsule 600 mg  600 mg Oral BID [name], Madie Reno, MD   600 mg at 12/03/19 0811  . magnesium hydroxide (MILK OF MAGNESIA) suspension 30 mL  30 mL Oral Daily PRN Cristofano, Paul A, MD      . ziprasidone (GEODON) injection 20 mg  20 mg Intramuscular Q6H PRN Mallie Darting, Cordie Grice, MD        Lab Results:  Results for orders placed or performed during the hospital encounter  of 11/30/19 (from the past 48 hour(s))  Lithium level     Status: Abnormal   Collection Time: 12/02/19  6:44 AM  Result Value Ref Range   Lithium Lvl 0.39 (L) 0.60 - 1.20 mmol/L    Comment: Performed at [facility], [address] [place], [place] [address]    Blood Alcohol level:  Lab Results  Component Value Date   [facility] <10 11/29/2019   ETH <10 11/21/2019    Metabolic Disorder Labs: Lab Results  Component Value Date   HGBA1C 5.1 05/05/2018   MPG 99.67 05/05/2018   Lab Results  Component Value Date   PROLACTIN 98.4 (H) 11/30/2019   PROLACTIN 132.8 (H) 10/25/2017   Lab Results  Component Value Date   CHOL 155 11/30/2019   TRIG 236 (H) 11/30/2019   HDL 30 (L) 11/30/2019   CHOLHDL 5.2 11/30/2019   VLDL 47 (H) 11/30/2019   LDLCALC 78 11/30/2019   LDLCALC 124 (H) 05/07/2018    Physical Findings: AIMS:  , ,  ,  ,    CIWA:    COWS:     Musculoskeletal: Strength & Muscle Tone: within normal limits Gait & Station: normal Patient leans: N/A  Psychiatric Specialty Exam: Physical Exam  Nursing note and vitals reviewed. Constitutional: She is oriented to person, place, and time. She appears well-developed and well-nourished.  HENT:  Head: Normocephalic and atraumatic.  Respiratory: Effort normal.  Neurological: She is alert and oriented to person, place, and time.    Review of Systems  Blood pressure (!) 140/98, pulse 81, temperature 98.1 F (36.7 C), temperature source Oral, resp. rate 16, height 5\' 4"  (1.626 m),  weight 96.6 kg, last menstrual period 11/22/2019, SpO2 98 %.Body mass index is 36.56 kg/m.  General Appearance: Disheveled  Eye Contact:  Minimal  Speech:  Normal Rate  Volume:  Decreased  Mood:  Dysphoric  Affect:  Flat  Thought Process:  Goal Directed and Descriptions of Associations: Circumstantial  Orientation:  Full (Time, Place, and Person)  Thought Content:  Delusions and Hallucinations: Auditory  Suicidal Thoughts:  Yes.  without intent/plan  Homicidal Thoughts:  No  Memory:  Immediate;   Poor Recent;   Poor Remote;   Poor  Judgement:  Impaired  Insight:  Lacking  Psychomotor Activity:  Decreased  Concentration:  Concentration: Fair and Attention Span: Fair  Recall:  01/20/2020 of Knowledge:  Fair  Language:  Fair  Akathisia:  Negative  Handed:  Right  AIMS (if indicated):     Assets:  Desire for Improvement Resilience  ADL's:  Impaired  Cognition:  WNL  Sleep:  Number of Hours: 8     Treatment Plan Summary: Daily contact with patient to assess and evaluate symptoms and progress in treatment, Medication management and Plan : Patient is seen and examined.  Patient is a 24 year old female with the above-stated past psychiatric history who is seen in follow-up.   Diagnosis: #1 schizoaffective disorder; bipolar type versus schizophrenia, #2 hyperprolactinemia, #3 elevated blood pressure this a.m., #4 hypertriglyceridemia  Patient is seen in follow-up.  She is essentially unchanged from yesterday.  I had increased her Haldol yesterday to 5 mg p.o. twice daily because of her breakthrough agitation.  I will go on increase that up to 7.5 mg p.o. twice daily.  No change in her Clozaril at least at this point.  Her lithium was increased on admission, so I will leave that alone.  Will increase her bromocriptine to 1 mg p.o.  nightly for hyperprolactinemia.  I have asked staff to try and find out from the group home the last time she had the long-acting Invega injection, and her  elevated prolactin is most likely secondary to the Clozaril as well as the Invega.  No other changes in her medications.  1.  Increase bromocriptine to 2.5 milligrams p.o. nightly. 2.  Increase clonazepam to 0.5 mg p.o. 3 times daily as needed anxiety and agitation. 3.  Continue Clozaril 400 mg p.o. nightly for psychosis. 4.  Continue Colace 100 mg p.o. twice daily secondary to constipation. 5.  Continue fenofibrate 160 mg p.o. daily for hypertriglyceridemia. 6.  Increase Haldol to 7.5 mg p.o. twice daily for psychosis and agitation. 7.  Continue Atrovent nasal spray 2 sprays in each nostril daily for seasonal allergies. 8.  Continue lithium carbonate 600 mg p.o. twice daily for mood stability. 9.  Continue Geodon 20 mg IM every 6 hours as needed agitation. 10.  Consideration of MRI to look at pituitary for possible prolactinoma leading to hyperprolactinemia. 11.  Confirmation of previous antipsychotic medications from pharmacy or her group home. 12.  Disposition planning-in progress.   Antonieta Pert, MD 12/03/2019, 10:12 AM

## 2019-12-03 NOTE — [facility] Group Notes (Signed)
[facility] Group Notes:  (Nursing/MHT/Case Management/Adjunct)  Date:  12/03/2019  Time:  11:52 AM  Type of Therapy:  Community Meeting  Participation Level:  Did Not Attend  Summary of Progress/Problems:  Kimberly Boyle 12/03/2019, 11:52 AM

## 2019-12-03 NOTE — Progress Notes (Signed)
Pt. At this time visibly observed in her room shouting bizarrely to herself. Pt. Given PRN medication for comfort.

## 2019-12-03 NOTE — [facility] Group Notes (Signed)
12/03/2019 1:00pm    Type of Therapy and Topic:  Group Therapy:  Change and Accountability  Participation Level:  Did Not Attend  Description of Group In this group, patients discussed power and accountability for change.  The group identified the challenges related to accountability and the difficulty of accepting the outcomes of negative behaviors.  Patients were encouraged to openly discuss a challenge/change they could take responsibility for.  Patients discussed the use of "change talk" and positive thinking as ways to support achievement of personal goals.  The group discussed ways to give support and empowerment to peers.  Therapeutic Goals: 1. Patients will state the relationship between personal power and accountability in the change process 2. Patients will identify the positive and negative consequences of a personal choice they have made 3. Patients will identify one challenge/choice they will take responsibility for making 4. Patients will discuss the role of "change talk" and the impact of positive thinking as it supports successful personal change 5. Patients will verbalize support and affirmation of change efforts in peers  Summary of Patient Progress:  X  Therapeutic Modalities Solution Focused Brief Therapy Motivational Interviewing Cognitive Behavioral Therapy    Teresita Madura, MSW, [facility] Clinical Social Worker  12/03/2019 11:21 AM

## 2019-12-04 DIAGNOSIS — F251 Schizoaffective disorder, depressive type: Secondary | ICD-10-CM

## 2019-12-04 NOTE — [facility] Group Notes (Signed)
[facility] Group Notes:  (Nursing/MHT/Case Management/Adjunct)  Date:  12/04/2019  Time:  8:55 AM  Type of Therapy:  Community Meeting  Participation Level:  Did Not Attend   Lynelle Smoke [facility] 12/04/2019, 8:55 AM

## 2019-12-04 NOTE — B[facility] Group Notes (Signed)
Overcoming Obstacles  12/04/2019 1PM  Type of Therapy and Topic:  Group Therapy:  Overcoming Obstacles  Participation Level:  Did Not Attend    Description of Group:    In this group patients will be encouraged to explore what they see as obstacles to their own wellness and recovery. They will be guided to discuss their thoughts, feelings, and behaviors related to these obstacles. The group will process together ways to cope with barriers, with attention given to specific choices patients can make. Each patient will be challenged to identify changes they are motivated to make in order to overcome their obstacles. This group will be process-oriented, with patients participating in exploration of their own experiences as well as giving and receiving support and challenge from other group members.   Therapeutic Goals: 1. Patient will identify personal and current obstacles as they relate to admission. 2. Patient will identify barriers that currently interfere with their wellness or overcoming obstacles.  3. Patient will identify feelings, thought process and behaviors related to these barriers. 4. Patient will identify two changes they are willing to make to overcome these obstacles:      Summary of Patient Progress     Therapeutic Modalities:   Cognitive Behavioral Therapy Solution Focused Therapy Motivational Interviewing Relapse Prevention Therapy    Lowella Dandy, MSW, LCSW 12/04/2019 1:57 PM

## 2019-12-04 NOTE — B[facility] Counselor (Signed)
CSW spoke with the [facility] [name] team, [phone].    CSW informed that patient was present at the hospital and checked if the team had any questions for [place] at this time.  [name] only question was if the patient was referred out to long-term care and if the pt's medication had been changed.  CSW informed that pt would be discharged to the group home and CSW was unsure of medication changes, however, would be happy to review the medications with the provider if they wished.  [name] declined the medication review.  [name] requested a phone call once pt was being discharged and noted that they will coordinate care within 24 hours.  Penni Homans, MSW, LCSW 12/04/2019 10:03 AM

## 2019-12-04 NOTE — Progress Notes (Signed)
[facility] MD Progress Note  12/04/2019 11:12 AM Kimberly Boyle  MRN:  [id number]   Subjective: Follow-up for this 24 year old female diagnosed with schizoaffective disorder.  Patient reports that she is feeling good today.  She states that she feels that things have improved greatly for her.  She denies having any suicidal or homicidal ideations and denies any hallucinations.  Patient states that she has not felt angry or agitated any in a couple of days.  She is stated that she is unsure what causes her to have outbursts and that it just happens sometimes.  She states that she has been sleeping better and that her appetite is been good.  She states that she has not been attending groups because it is not one of her favorite things to do and is not interested in going to groups.  She denies having any medication side effects at this time.  She reports she has not spoken to anyone about returning to the group home and stated that the social worker informed her that they were going to contact them.  Principal Problem: Schizoaffective disorder (HCC) Diagnosis: Principal Problem:   Schizoaffective disorder (HCC) Active Problems:   Suicidal ideation   Obesity  Total Time spent with patient: 20 minutes  Past Psychiatric History: Patient has a past history of chronic psychotic disorder.  Previously diagnosed with schizoaffective disorder.  Last known hospitalization in 2019.  Has had some presentations to our emergency room more recently for symptoms similar to what she is reporting now.  Patient has been on multiple antipsychotics in the past including Risperdal Invega Zyprexa all without adequate benefit.  Clozapine did seem to be of some help in the past.  She does have a past history of extensive self injury and suicidal behavior.  Has a history of marijuana abuse but denies any recent drug use at all.  The MDMA positive drug screen came up a week ago also and she could not come up with an explanation for  it then either.  Past Medical History:  Past Medical History:  Diagnosis Date  . Anxiety   . Arthritis   . Bipolar disorder (HCC)   . Depression   . Hidradenitis suppurativa 06/22/2019   Groin   . Major depressive disorder   . Panic disorder   . PID (acute pelvic inflammatory disease) 06/22/2019   06/22/19   . Poor historian 06/22/2019  . Schizophrenia [facility])     Past Surgical History:  Procedure Laterality Date  . COLONOSCOPY WITH PROPOFOL N/A 09/29/2019   Procedure: COLONOSCOPY WITH PROPOFOL;  Surgeon: Toney Reil, MD;  Location: [facility] ENDOSCOPY;  Service: Gastroenterology;  Laterality: N/A;  . ESOPHAGOGASTRODUODENOSCOPY (EGD) WITH PROPOFOL N/A 09/29/2019   Procedure: ESOPHAGOGASTRODUODENOSCOPY (EGD) WITH PROPOFOL;  Surgeon: Toney Reil, MD;  Location: [facility] ENDOSCOPY;  Service: Gastroenterology;  Laterality: N/A;   Family History:  Family History  Problem Relation Age of Onset  . Post-traumatic stress disorder Mother   . Depression Mother   . Depression Maternal Uncle   . Cancer Maternal Grandmother    Family Psychiatric  History: None reported Social History:  Social History   Substance and Sexual Activity  Alcohol Use No     Social History   Substance and Sexual Activity  Drug Use Not Currently  . Types: Marijuana    Social History   Socioeconomic History  . Marital status: Single    Spouse name: Not on file  . Number of children: 0  . Years of  education: Not on file  . Highest education level: High school graduate  Occupational History    Comment: unemployed  Tobacco Use  . Smoking status: Current Every Day Smoker    Packs/day: 0.50    Years: 11.00    Pack years: 5.50    Types: Cigarettes  . Smokeless tobacco: Never Used  Substance and Sexual Activity  . Alcohol use: No  . Drug use: Not Currently    Types: Marijuana  . Sexual activity: Not Currently  Other Topics Concern  . Not on file  Social History Narrative   ** Merged History  Encounter **       Social Determinants of Health   Financial Resource Strain:   . Difficulty of Paying Living Expenses: Not on file  Food Insecurity:   . Worried About Programme researcher, broadcasting/film/video in the Last Year: Not on file  . Ran Out of Food in the Last Year: Not on file  Transportation Needs:   . Lack of Transportation (Medical): Not on file  . Lack of Transportation (Non-Medical): Not on file  Physical Activity:   . Days of Exercise per Week: Not on file  . Minutes of Exercise per Session: Not on file  Stress:   . Feeling of Stress : Not on file  Social Connections:   . Frequency of Communication with Friends and Family: Not on file  . Frequency of Social Gatherings with Friends and Family: Not on file  . Attends Religious Services: Not on file  . Active Member of Clubs or Organizations: Not on file  . Attends Banker Meetings: Not on file  . Marital Status: Not on file   Additional Social History:                         Sleep: Good  Appetite:  Good  Current Medications: Current Facility-Administered Medications  Medication Dose Route Frequency Provider Last Rate Last Admin  . alum & mag hydroxide-simeth (MAALOX/MYLANTA) 200-200-20 MG/5ML suspension 30 mL  30 mL Oral Q4H PRN Cristofano, Worthy Rancher, MD   30 mL at 12/02/19 2004  . bromocriptine (PARLODEL) tablet 2.5 mg  2.5 mg Oral QHS Antonieta Pert, MD   2.5 mg at 12/03/19 2108  . clonazePAM (KLONOPIN) tablet 0.5 mg  0.5 mg Oral TID PRN Antonieta Pert, MD   0.5 mg at 12/03/19 1457  . cloZAPine (CLOZARIL) tablet 400 mg  400 mg Oral QHS Cristofano, Worthy Rancher, MD   400 mg at 12/03/19 2107  . docusate sodium (COLACE) capsule 100 mg  100 mg Oral BID [name], [name], MD   100 mg at 12/04/19 0826  . fenofibrate tablet 160 mg  160 mg Oral Daily Antonieta Pert, MD   160 mg at 12/04/19 6767  . haloperidol (HALDOL) tablet 7.5 mg  7.5 mg Oral BID Antonieta Pert, MD   7.5 mg at 12/04/19 0825  . ipratropium  (ATROVENT) 0.06 % nasal spray 2 spray  2 spray Each Nare QHS [name], [name], MD   2 spray at 12/02/19 2121  . lithium carbonate capsule 600 mg  600 mg Oral BID [name], [name], MD   600 mg at 12/04/19 0826  . magnesium hydroxide (MILK OF MAGNESIA) suspension 30 mL  30 mL Oral Daily PRN Cristofano, Paul A, MD      . ziprasidone (GEODON) injection 20 mg  20 mg Intramuscular Q6H PRN Jola Babinski Marlane Mingle, MD  Lab Results: No results found for this or any previous visit (from the past 48 hour(s)).  Blood Alcohol level:  Lab Results  Component Value Date   ETH <10 11/29/2019   ETH <10 11/21/2019    Metabolic Disorder Labs: Lab Results  Component Value Date   HGBA1C 5.1 05/05/2018   MPG 99.67 05/05/2018   Lab Results  Component Value Date   PROLACTIN 98.4 (H) 11/30/2019   PROLACTIN 132.8 (H) 10/25/2017   Lab Results  Component Value Date   CHOL 155 11/30/2019   TRIG 236 (H) 11/30/2019   HDL 30 (L) 11/30/2019   CHOLHDL 5.2 11/30/2019   VLDL 47 (H) 11/30/2019   LDLCALC 78 11/30/2019   LDLCALC 124 (H) 05/07/2018    Physical Findings: AIMS:  , ,  ,  ,    CIWA:    COWS:     Musculoskeletal: Strength & Muscle Tone: within normal limits Gait & Station: normal Patient leans: N/A  Psychiatric Specialty Exam: Physical Exam  Nursing note and vitals reviewed. Constitutional: She is oriented to person, place, and time. She appears well-developed and well-nourished.  Cardiovascular: Normal rate.  Respiratory: Effort normal.  Musculoskeletal:        General: Normal range of motion.  Neurological: She is alert and oriented to person, place, and time.  Skin: Skin is warm.    Review of Systems  Constitutional: Negative.   HENT: Negative.   Eyes: Negative.   Respiratory: Negative.   Cardiovascular: Negative.   Gastrointestinal: Negative.   Genitourinary: Negative.   Musculoskeletal: Negative.   Skin: Negative.   Neurological: Negative.   Psychiatric/Behavioral:  Negative.     Blood pressure (!) 140/98, pulse 81, temperature 98.1 F (36.7 C), temperature source Oral, resp. rate 16, height 5\' 4"  (1.626 m), weight 96.6 kg, last menstrual period 11/22/2019, SpO2 98 %.Body mass index is 36.56 kg/m.  General Appearance: Casual  Eye Contact:  Fair  Speech:  Clear and Coherent and Normal Rate  Volume:  Decreased  Mood:  Euthymic  Affect:  Congruent  Thought Process:  Coherent and Descriptions of Associations: Intact  Orientation:  Full (Time, Place, and Person)  Thought Content:  WDL  Suicidal Thoughts:  No  Homicidal Thoughts:  No  Memory:  Immediate;   Fair Recent;   Fair Remote;   Fair  Judgement:  Fair  Insight:  Fair  Psychomotor Activity:  Normal  Concentration:  Concentration: Fair  Recall:  01/20/2020 of Knowledge:  Fair  Language:  Fair  Akathisia:  No  Handed:  Right  AIMS (if indicated):     Assets:  Desire for Improvement Financial Resources/Insurance Housing Physical Health Social Support Transportation  ADL's:  Intact  Cognition:  WNL  Sleep:  Number of Hours: 7   Assessment: Patient presents in her room lying in the bed but is awake.  Patient is pleasant, calm, cooperative.  Patient has congruent affect and is pleasant during our conversation.  Patient has continued to deny any suicidal homicidal ideations and denies any hallucinations.  There have been no reports of any additional outbursts.  Patient has shown some significant improvement since her admission.  Lithium level is 0.39 the patient is showing stability.  CSW is getting in touch with the legal guardian as well as group home to start arranging patient's discharge.  Treatment Plan Summary: Daily contact with patient to assess and evaluate symptoms and progress in treatment and Medication management Continue bromocriptine 2.5 mg p.o. nightly for elevated  prolactin level Continue Klonopin 0.5 mg p.o. 3 times daily as needed for anxiety Continue Clozaril 400 mg p.o.  nightly for schizoaffective disorder Continue Haldol 7.5 mg p.o. twice daily for schizoaffective disorder and mood stability Continue lithium 600 mg p.o. twice daily for mood stability Continue Geodon 20 mg IM every 6 hours as needed for agitation Encourage group therapy participation Discharge planning in progress Continue every 15 minute safety checks  Lewis Shock, FNP 12/04/2019, 11:12 AM

## 2019-12-04 NOTE — BHH Counselor (Signed)
CSW spoke with [place] [facility] [employer] legal guardian, [phone]. CSW checked if she was able to receive the copy of the consents and releases faxed over last week.  Rhesha replied "let me check my mailbox because I know my coworker last week said that they were in training".  CSW asked for a callback if the paperwork was not present so that it can be resent.  CSW noted that patient is approaching discharge and consents are needed to confirm if pt can return home and if group home can provide transportation.   Penni Homans, MSW, LCSW 12/04/2019 9:04 AM

## 2019-12-04 NOTE — B[facility] Group Notes (Signed)
[facility] Group Notes:  (Nursing/MHT/Case Management/Adjunct)  Date:  12/04/2019  Time:  10:17 PM  Type of Therapy:  Group Therapy  Participation Level:  Active  Participation Quality:  Appropriate  Affect:  Appropriate  Cognitive:  Appropriate  Insight:  Appropriate  Engagement in Group:  Engaged  Modes of Intervention:  Discussion  Summary of Progress/Problems:  Kimberly Boyle 12/04/2019, 10:17 PM

## 2019-12-04 NOTE — Plan of Care (Signed)
Patient stayed in bed most of the shift.Stated that she feels "100% better today." Patient asked for PRN for anxiety,stated that when she realized there is no support system for her that triggers her anxiety.Patient verbalized that she is ready to go back to the group home.Compliant with medications.Appetite and encouragement given.Did not attend groups.Support and encouragement given.

## 2019-12-04 NOTE — Progress Notes (Signed)
Recreation Therapy Notes  Date: 12/04/2019  Time: 9:30 am   Location: Craft room   Behavioral response: N/A   Intervention Topic: Coping-Skills    Discussion/Intervention: Patient did not attend group.   Clinical Observations/Feedback:  Patient did not attend group.   Renise Gillies LRT/CTRS        Tokiko Diefenderfer 12/04/2019 11:35 AM

## 2019-12-04 NOTE — Progress Notes (Signed)
D: Patient has been isolative to room. Still says she hears voices and that they are driving her crazy but has been calm and cooperative. Contracting for safety. Occasional passive SI A: Continue to monitor for safety R: Safety maintained.

## 2019-12-04 NOTE — B[facility] Group Notes (Signed)
[facility] Group Notes:  (Nursing/MHT/Case Management/Adjunct)  Date:  12/04/2019  Time:  11:18 AM  Type of Therapy:  Psychoeducational Skills  Participation Level:  Did Not Attend  Lynelle Smoke [facility] 12/04/2019, 11:18 AM

## 2019-12-04 NOTE — Plan of Care (Signed)
  Problem: Safety: Goal: Periods of time without injury will increase Outcome: Progressing  D: Patient has been isolative to room. Still says she hears voices and that they are driving her crazy but has been calm and cooperative. Contracting for safety. Occasional passive SI A: Continue to monitor for safety R: Safety maintained.

## 2019-12-05 LAB — CLOZAPINE (CLOZARIL)
Clozapine Lvl: 240 ng/mL — ABNORMAL LOW (ref 350–650)
NorClozapine: 223 ng/mL
Total(Cloz+Norcloz): 463 ng/mL

## 2019-12-05 NOTE — Progress Notes (Signed)
[facility] MD Progress Note  12/05/2019 3:25 PM Kimberly Boyle  MRN:  [id number] Subjective: Patient seen and chart reviewed.  Patient tells me she is feeling much better today.  She says she is not having any hallucinations.  Mood feels stable.  She says that she feels like the medicine has made a great difference.  She has not shown any behavior problems or been acting out.  No new physical complaints. Principal Problem: Schizoaffective disorder ([place]) Diagnosis: Principal Problem:   Schizoaffective disorder ([place]) Active Problems:   Suicidal ideation   Obesity  Total Time spent with patient: 30 minutes  Past Psychiatric History: Patient has a history of schizophrenia.  Young but has developed illness fairly quickly.  Past Medical History:  Past Medical History:  Diagnosis Date  . Anxiety   . Arthritis   . Bipolar disorder ([place])   . Depression   . Hidradenitis suppurativa 06/22/2019   Groin   . Major depressive disorder   . Panic disorder   . PID (acute pelvic inflammatory disease) 06/22/2019   06/22/19   . Poor historian 06/22/2019  . Schizophrenia [facility])     Past Surgical History:  Procedure Laterality Date  . COLONOSCOPY WITH PROPOFOL N/A 09/29/2019   Procedure: COLONOSCOPY WITH PROPOFOL;  Surgeon: Lin Landsman, MD;  Location: [facility] ENDOSCOPY;  Service: Gastroenterology;  Laterality: N/A;  . ESOPHAGOGASTRODUODENOSCOPY (EGD) WITH PROPOFOL N/A 09/29/2019   Procedure: ESOPHAGOGASTRODUODENOSCOPY (EGD) WITH PROPOFOL;  Surgeon: Lin Landsman, MD;  Location: [facility] ENDOSCOPY;  Service: Gastroenterology;  Laterality: N/A;   Family History:  Family History  Problem Relation Age of Onset  . Post-traumatic stress disorder Mother   . Depression Mother   . Depression Maternal Uncle   . Cancer Maternal Grandmother    Family Psychiatric  History: See previous Social History:  Social History   Substance and Sexual Activity  Alcohol Use No     Social History   Substance and  Sexual Activity  Drug Use Not Currently  . Types: Marijuana    Social History   Socioeconomic History  . Marital status: Single    Spouse name: Not on file  . Number of children: 0  . Years of education: Not on file  . Highest education level: High school graduate  Occupational History    Comment: unemployed  Tobacco Use  . Smoking status: Current Every Day Smoker    Packs/day: 0.50    Years: 11.00    Pack years: 5.50    Types: Cigarettes  . Smokeless tobacco: Never Used  Substance and Sexual Activity  . Alcohol use: No  . Drug use: Not Currently    Types: Marijuana  . Sexual activity: Not Currently  Other Topics Concern  . Not on file  Social History Narrative   ** Merged History Encounter **       Social Determinants of Health   Financial Resource Strain:   . Difficulty of Paying Living Expenses: Not on file  Food Insecurity:   . Worried About Charity fundraiser in the Last Year: Not on file  . Ran Out of Food in the Last Year: Not on file  Transportation Needs:   . Lack of Transportation (Medical): Not on file  . Lack of Transportation (Non-Medical): Not on file  Physical Activity:   . Days of Exercise per Week: Not on file  . Minutes of Exercise per Session: Not on file  Stress:   . Feeling of Stress : Not on file  Social Connections:   . Frequency of Communication with Friends and Family: Not on file  . Frequency of Social Gatherings with Friends and Family: Not on file  . Attends Religious Services: Not on file  . Active Member of Clubs or Organizations: Not on file  . Attends Banker Meetings: Not on file  . Marital Status: Not on file   Additional Social History:                         Sleep: Fair  Appetite:  Fair  Current Medications: Current Facility-Administered Medications  Medication Dose Route Frequency Provider Last Rate Last Admin  . alum & mag hydroxide-simeth (MAALOX/MYLANTA) 200-200-20 MG/5ML suspension 30 mL   30 mL Oral Q4H PRN Cristofano, Worthy Rancher, MD   30 mL at 12/02/19 2004  . bromocriptine (PARLODEL) tablet 2.5 mg  2.5 mg Oral QHS Antonieta Pert, MD   2.5 mg at 12/04/19 2110  . clonazePAM (KLONOPIN) tablet 0.5 mg  0.5 mg Oral TID PRN Antonieta Pert, MD   0.5 mg at 12/04/19 2109  . cloZAPine (CLOZARIL) tablet 400 mg  400 mg Oral QHS Cristofano, Worthy Rancher, MD   400 mg at 12/04/19 2109  . docusate sodium (COLACE) capsule 100 mg  100 mg Oral BID Kegan Mckeithan, [name], MD   100 mg at 12/05/19 0824  . fenofibrate tablet 160 mg  160 mg Oral Daily Antonieta Pert, MD   160 mg at 12/05/19 0825  . haloperidol (HALDOL) tablet 7.5 mg  7.5 mg Oral BID Antonieta Pert, MD   7.5 mg at 12/05/19 3358  . ipratropium (ATROVENT) 0.06 % nasal spray 2 spray  2 spray Each Nare QHS Ashara Lounsbury, [name], MD   2 spray at 12/02/19 2121  . lithium carbonate capsule 600 mg  600 mg Oral BID Arnett Galindez, [name], MD   600 mg at 12/05/19 0824  . magnesium hydroxide (MILK OF MAGNESIA) suspension 30 mL  30 mL Oral Daily PRN Cristofano, Paul A, MD      . ziprasidone (GEODON) injection 20 mg  20 mg Intramuscular Q6H PRN Antonieta Pert, MD        Lab Results: No results found for this or any previous visit (from the past 48 hour(s)).  Blood Alcohol level:  Lab Results  Component Value Date   ETH <10 11/29/2019   ETH <10 11/21/2019    Metabolic Disorder Labs: Lab Results  Component Value Date   HGBA1C 5.1 05/05/2018   MPG 99.67 05/05/2018   Lab Results  Component Value Date   PROLACTIN 98.4 (H) 11/30/2019   PROLACTIN 132.8 (H) 10/25/2017   Lab Results  Component Value Date   CHOL 155 11/30/2019   TRIG 236 (H) 11/30/2019   HDL 30 (L) 11/30/2019   CHOLHDL 5.2 11/30/2019   VLDL 47 (H) 11/30/2019   LDLCALC 78 11/30/2019   LDLCALC 124 (H) 05/07/2018    Physical Findings: AIMS:  , ,  ,  ,    CIWA:    COWS:     Musculoskeletal: Strength & Muscle Tone: within normal limits Gait & Station: normal Patient leans:  N/A  Psychiatric Specialty Exam: Physical Exam  Nursing note and vitals reviewed. Constitutional: She appears well-developed and well-nourished.  HENT:  Head: Normocephalic and atraumatic.  Eyes: Pupils are equal, round, and reactive to light. Conjunctivae are normal.  Cardiovascular: Regular rhythm and normal heart sounds.  Respiratory: Effort normal. No  respiratory distress.  GI: Soft.  Musculoskeletal:        General: Normal range of motion.     Cervical back: Normal range of motion.  Neurological: She is alert.  Skin: Skin is warm and dry.  Psychiatric: She has a normal mood and affect. Her behavior is normal. Judgment and thought content normal.    Review of Systems  Constitutional: Negative.   HENT: Negative.   Eyes: Negative.   Respiratory: Negative.   Cardiovascular: Negative.   Gastrointestinal: Negative.   Musculoskeletal: Negative.   Skin: Negative.   Neurological: Negative.   Psychiatric/Behavioral: Negative.     Blood pressure (!) 140/98, pulse 81, temperature 98.1 F (36.7 C), temperature source Oral, resp. rate 16, height 5\' 4"  (1.626 m), weight 96.6 kg, last menstrual period 11/22/2019, SpO2 98 %.Body mass index is 36.56 kg/m.  General Appearance: Disheveled  Eye Contact:  Good  Speech:  Clear and Coherent  Volume:  Normal  Mood:  Euthymic  Affect:  Congruent  Thought Process:  Goal Directed  Orientation:  Full (Time, Place, and Person)  Thought Content:  Logical  Suicidal Thoughts:  No  Homicidal Thoughts:  No  Memory:  Immediate;   Fair Recent;   Fair Remote;   Fair  Judgement:  Fair  Insight:  Fair  Psychomotor Activity:  Normal  Concentration:  Concentration: Fair  Recall:  01/20/2020 of Knowledge:  Fair  Language:  Fair  Akathisia:  Negative  Handed:  Right  AIMS (if indicated):     Assets:  Desire for Improvement  ADL's:  Intact  Cognition:  WNL  Sleep:  Number of Hours: 8     Treatment Plan Summary: Daily contact with patient to  assess and evaluate symptoms and progress in treatment, Medication management and Plan No change to current medicine.  Treatment team will be advised and we will probably look to start planning for discharge tomorrow.  [employer], MD 12/05/2019, 3:25 PM

## 2019-12-05 NOTE — Progress Notes (Signed)
D: Patient denies hearing voices, denies SI but states "I wanna be sedated". She says she is having racing thoughts and that is why she wants to be sedated. Medicated with klonopin per prn order. Patient says she would rather have a shot. Told patient to try the klonopin first. Patient went to sleep with no further complaints. A: continue to monitor for safety R: Safety maintained.

## 2019-12-05 NOTE — Plan of Care (Signed)
  Problem: Education: Goal: Knowledge of [facility] Education information/materials will improve Outcome: Progressing Goal: Emotional status will improve Outcome: Progressing Goal: Mental status will improve Outcome: Progressing Goal: Verbalization of understanding the information provided will improve Outcome: Progressing  D: Patient denies hearing voices, denies SI but states "I wanna be sedated". She says she is having racing thoughts and that is why she wants to be sedated. Medicated with klonopin per prn order. Patient says she would rather have a shot. Told patient to try the klonopin first. Patient went to sleep with no further complaints. A: continue to monitor for safety R: Safety maintained.

## 2019-12-05 NOTE — B[facility] Group Notes (Signed)
[facility] Group Notes:  (Nursing/MHT/Case Management/Adjunct)  Date:  12/05/2019  Time:  9:38 PM  Type of Therapy:  Group Therapy  Participation Level:  Active  Participation Quality:  Appropriate  Affect:  Appropriate  Cognitive:  Appropriate  Insight:  Appropriate  Engagement in Group:  Engaged  Modes of Intervention:  Discussion  Summary of Progress/Problems:  Kimberly Boyle 12/05/2019, 9:38 PM

## 2019-12-05 NOTE — B[facility] Group Notes (Signed)
[facility] Group Notes:  (Nursing/MHT/Case Management/Adjunct)  Date:  12/05/2019  Time:  10:16 AM  Type of Therapy:  Community Meeting   Participation Level:  Did Not Attend   [name] 12/05/2019, 10:16 AM

## 2019-12-05 NOTE — Progress Notes (Signed)
Recreation Therapy Notes  Date: 12/05/2019  Time: 9:30 am   Location: Craft room   Behavioral response: N/A   Intervention Topic: Communication    Discussion/Intervention: Patient did not attend group.   Clinical Observations/Feedback:  Patient did not attend group.   Paloma Grange LRT/CTRS        Sung Renton 12/05/2019 11:15 AM

## 2019-12-05 NOTE — [facility] Group Notes (Signed)
  LCSW Group Therapy Note  12/05/2019 11:47 AM   Type of Therapy/Topic:  Group Therapy:  Feelings about Diagnosis  Participation Level:  Did Not Attend   Description of Group:   This group will allow patients to explore their thoughts and feelings about diagnoses they have received. Patients will be guided to explore their level of understanding and acceptance of these diagnoses. Facilitator will encourage patients to process their thoughts and feelings about the reactions of others to their diagnosis and will guide patients in identifying ways to discuss their diagnosis with significant others in their lives. This group will be process-oriented, with patients participating in exploration of their own experiences, giving and receiving support, and processing challenge from other group members.   Therapeutic Goals: 1. Patient will demonstrate understanding of diagnosis as evidenced by identifying two or more symptoms of the disorder 2. Patient will be able to express two feelings regarding the diagnosis 3. Patient will demonstrate their ability to communicate their needs through discussion and/or role play  Summary of Patient Progress: x   Therapeutic Modalities:   Cognitive Behavioral Therapy Brief Therapy Feelings Identification    Haleem Hanner, MSW, LCSW Clinical Social Work 12/05/2019 11:47 AM    

## 2019-12-05 NOTE — Plan of Care (Addendum)
Pt denies depression, anxiety, SI, HI and AVH. Pt was encouraged to attend groups however she refused saying that they do not help her. Pt was educated on the new programing and all the variety of groups and on care plan. Torrie Mayers RN Problem: Education: Goal: Charity fundraiser Education information/materials will improve Outcome: Progressing Goal: Emotional status will improve Outcome: Progressing Goal: Mental status will improve Outcome: Progressing Goal: Verbalization of understanding the information provided will improve Outcome: Progressing   Problem: Safety: Goal: Periods of time without injury will increase Outcome: Progressing   Problem: Education: Goal: Ability to make informed decisions regarding treatment will improve Outcome: Progressing   Problem: Self-Concept: Goal: Ability to disclose and discuss suicidal ideas will improve Outcome: Progressing Goal: Will verbalize positive feelings about self Outcome: Progressing

## 2019-12-05 NOTE — [facility] Group Notes (Signed)
[facility] Group Notes:  (Nursing/MHT/Case Management/Adjunct)  Date:  12/05/2019  Time:  3:05 PM  Type of Therapy:  Psychoeducational Skills  Participation Level:  Did Not Attend   Marlen Koman A Jaslyn Bansal 12/05/2019, 3:05 PM 

## 2019-12-06 ENCOUNTER — Other Ambulatory Visit: Payer: Self-pay | Admitting: Psychiatry

## 2019-12-06 MED ORDER — CLONAZEPAM 0.5 MG PO TABS: 0.5000 mg | ORAL_TABLET | Freq: Three times a day (TID) | ORAL | 0 refills | Status: AC | PRN

## 2019-12-06 MED ORDER — HALOPERIDOL 5 MG PO TABS: 7.5000 mg | ORAL_TABLET | Freq: Two times a day (BID) | ORAL | Status: DC

## 2019-12-06 MED ORDER — LITHIUM CARBONATE 600 MG PO CAPS: 600.0000 mg | ORAL_CAPSULE | Freq: Two times a day (BID) | ORAL | 1 refills | Status: AC

## 2019-12-06 MED ORDER — BROMOCRIPTINE MESYLATE 2.5 MG PO TABS: 2.5000 mg | ORAL_TABLET | Freq: Every day | ORAL | 1 refills | Status: AC

## 2019-12-06 MED ORDER — FENOFIBRATE 160 MG PO TABS: 160.0000 mg | ORAL_TABLET | Freq: Every day | ORAL | 1 refills | Status: AC

## 2019-12-06 MED ORDER — DOCUSATE SODIUM 100 MG PO CAPS: 100.0000 mg | ORAL_CAPSULE | Freq: Two times a day (BID) | ORAL | 0 refills | Status: AC

## 2019-12-06 MED ORDER — IPRATROPIUM BROMIDE 0.06 % NA SOLN: 2.0000 | Freq: Every day | NASAL | 1 refills | Status: AC

## 2019-12-06 MED ORDER — HALOPERIDOL 5 MG PO TABS: 7.0000 mg | ORAL_TABLET | Freq: Two times a day (BID) | ORAL | 1 refills | Status: AC

## 2019-12-06 MED ORDER — CLOZAPINE 100 MG PO TABS: 400.0000 mg | ORAL_TABLET | Freq: Every day | ORAL | 1 refills | Status: AC

## 2019-12-06 MED ORDER — HALOPERIDOL 5 MG PO TABS: 7.0000 mg | ORAL_TABLET | Freq: Two times a day (BID) | ORAL | Status: DC

## 2019-12-06 MED ORDER — CLOZAPINE 100 MG PO TABS: 400.0000 mg | ORAL_TABLET | Freq: Every day | ORAL | 1 refills | Status: DC

## 2019-12-06 NOTE — [facility] Suicide Risk Assessment (Signed)
[facility] Discharge Suicide Risk Assessment   Principal Problem: Schizoaffective disorder [facility]) Discharge Diagnoses: Principal Problem:   Schizoaffective disorder (HCC) Active Problems:   Suicidal ideation   Obesity   Total Time spent with patient: 30 minutes  Musculoskeletal: Strength & Muscle Tone: within normal limits Gait & Station: normal Patient leans: N/A  Psychiatric Specialty Exam: Review of Systems  Constitutional: Negative.   HENT: Negative.   Eyes: Negative.   Respiratory: Negative.   Cardiovascular: Negative.   Gastrointestinal: Negative.   Musculoskeletal: Negative.   Skin: Negative.   Neurological: Negative.   Psychiatric/Behavioral: Negative.     Blood pressure (!) 140/98, pulse 81, temperature 98.1 F (36.7 C), temperature source Oral, resp. rate 16, height 5\' 4"  (1.626 m), weight 96.6 kg, last menstrual period 11/22/2019, SpO2 98 %.Body mass index is 36.56 kg/m.  General Appearance: Casual  Eye Contact::  Good  Speech:  Clear and Coherent409  Volume:  Normal  Mood:  Euthymic  Affect:  Congruent  Thought Process:  Goal Directed  Orientation:  Full (Time, Place, and Person)  Thought Content:  Logical  Suicidal Thoughts:  No  Homicidal Thoughts:  No  Memory:  Immediate;   Fair Recent;   Fair Remote;   Fair  Judgement:  Fair  Insight:  Fair  Psychomotor Activity:  Normal  Concentration:  Fair  Recall:  [id number] of Knowledge:Fair  Language: Fair  Akathisia:  No  Handed:  Right  AIMS (if indicated):     Assets:  Desire for Improvement Housing Physical Health Resilience Social Support  Sleep:  Number of Hours: 9  Cognition: WNL  ADL's:  Intact   Mental Status Per Nursing Assessment::   On Admission:  Suicidal ideation indicated by patient  Demographic Factors:  NA  Loss Factors: NA  Historical Factors: Impulsivity  Risk Reduction Factors:   Positive social support and Positive therapeutic relationship  Continued Clinical  Symptoms:  Schizophrenia:   Less than 22 years old  Cognitive Features That Contribute To Risk:  None    Suicide Risk:  Minimal: No identifiable suicidal ideation.  Patients presenting with no risk factors but with morbid ruminations; may be classified as minimal risk based on the severity of the depressive symptoms  Follow-up Information    [facility] Follow up.   Contact information: [address] [place] [phone]           Plan Of Care/Follow-up recommendations:  Activity:  Activity as tolerated Diet:  Regular diet Other:  Follow-up with outpatient treatment with psychotherapeutic services.  [phone], MD 12/06/2019, 9:08 AM

## 2019-12-06 NOTE — Progress Notes (Signed)
Recreation Therapy Notes   Date: 12/06/2019  Time: 9:30 am   Location: Craft room   Behavioral response: N/A   Intervention Topic: Self-esteem   Discussion/Intervention: Patient did not attend group.   Clinical Observations/Feedback:  Patient did not attend group.   Leotha Westermeyer LRT/CTRS        Kimberly Boyle 12/06/2019 12:36 PM 

## 2019-12-06 NOTE — Progress Notes (Signed)
Pt denies SI, HI and AVH. Pt was educated on d/c plan and verbalizes understanding. Pt received belongings, prescriptions and d/c packet. Etter Royall RN 

## 2019-12-06 NOTE — [facility] Group Notes (Signed)
[facility] Group Notes:  (Nursing/MHT/Case Management/Adjunct)  Date:  12/06/2019  Time:  9:00 AM  Type of Therapy:  Community Meeting   Participation Level:  Did Not Attend    [name] 12/06/2019, 9:00 AM

## 2019-12-06 NOTE — Plan of Care (Signed)
Pt denies depression, anxiety, SI, HI and AVH. Pt was educated on care plan and verbalizes understanding. Pt was encouraged to attend groups before dc today. Torrie Mayers RN Problem: Education: Goal: Knowledge of [facility] Education information/materials will improve Outcome: Adequate for Discharge Goal: Emotional status will improve Outcome: Adequate for Discharge Goal: Mental status will improve Outcome: Adequate for Discharge Goal: Verbalization of understanding the information provided will improve Outcome: Adequate for Discharge   Problem: Safety: Goal: Periods of time without injury will increase Outcome: Adequate for Discharge   Problem: Education: Goal: Ability to make informed decisions regarding treatment will improve Outcome: Adequate for Discharge   Problem: Self-Concept: Goal: Ability to disclose and discuss suicidal ideas will improve Outcome: Adequate for Discharge Goal: Will verbalize positive feelings about self Outcome: Adequate for Discharge

## 2019-12-06 NOTE — [facility] Group Notes (Signed)
LCSW Group Therapy Note  12/06/2019 2:05 PM  Type of Therapy/Topic:  Group Therapy:  Emotion Regulation  Participation Level:  Minimal   Description of Group:   The purpose of this group is to assist patients in learning to regulate negative emotions and experience positive emotions. Patients will be guided to discuss ways in which they have been vulnerable to their negative emotions. These vulnerabilities will be juxtaposed with experiences of positive emotions or situations, and patients will be challenged to use positive emotions to combat negative ones. Special emphasis will be placed on coping with negative emotions in conflict situations, and patients will process healthy conflict resolution skills.  Therapeutic Goals: 1. Patient will identify two positive emotions or experiences to reflect on in order to balance out negative emotions 2. Patient will label two or more emotions that they find the most difficult to experience 3. Patient will demonstrate positive conflict resolution skills through discussion and/or role plays  Summary of Patient Progress: Patient was present for portion of group.  Patient shared how she uses coloring as a coping skill when she feels overwhelmed.  Patient also shared that she uses rice krispie treats as a Associate Professor.  Patient left group before discussion was complete.   Therapeutic Modalities:   Cognitive Behavioral Therapy Feelings Identification Dialectical Behavioral Therapy  Penni Homans, MSW, LCSW 12/06/2019 2:05 PM

## 2019-12-06 NOTE — Progress Notes (Signed)
Recreation Therapy Notes  INPATIENT RECREATION TR PLAN  Patient Details Name: Kimberly Boyle MRN: [id number] DOB: 1996-03-22 Today's Date: 12/06/2019  Rec Therapy Plan Is patient appropriate for Therapeutic Recreation?: Yes Treatment times per week: at least 3 Estimated Length of Stay: 5-7 days TR Treatment/Interventions: Group participation (Comment)  Discharge Criteria Pt will be discharged from therapy if:: Discharged Treatment plan/goals/alternatives discussed and agreed upon by:: Patient/family  Discharge Summary Short term goals set: Patient will engage in groups without prompting or encouragement from LRT x3 group sessions within 5 recreation therapy group sessions Short term goals met: Not met Reason goals not met: Patient did not attend any groups Therapeutic equipment acquired: N/A Reason patient discharged from therapy: Discharge from hospital Pt/family agrees with progress & goals achieved: Yes Date patient discharged from therapy: 12/06/19   Praneel Haisley 12/06/2019, 3:35 PM

## 2019-12-06 NOTE — Progress Notes (Signed)
Patient is alert ad oriented x 4, affect is blunted, not irritable, no loud outburst, she was calm and receptive to staff, interaction is appropriate, no distress noted she expressed she was told she would be discharged today. Patient appears to have insight into mental health treatment, she was interacting with peers appropriately this shift and even attended evening wrap up group.  Patient currently denies SI/HI/AH, she  was complaint with medication this shift. 15 minutes safety checks maintained will continue to monitor

## 2019-12-06 NOTE — Progress Notes (Signed)
CSW contacted [name] and spoke with Delores who reported the team is in their morning meeting and she would have someone call CSW back with follow up appointment for pt.   Iris Pert, MSW, LCSW Clinical Social Work 12/06/2019 9:52 AM

## 2019-12-06 NOTE — Progress Notes (Signed)
  [facility] Adult Case Management Discharge Plan :  Will you be returning to the same living situation after discharge:  Yes,  pt returning to her group home.  At discharge, do you have transportation home?: Yes,  group home will provide transportation.  Do you have the ability to pay for your medications: Yes,  Medicare  Release of information consent forms completed and in the chart;  Patient's signature needed at discharge.  Patient to Follow up at: Follow-up Information    [facility] Follow up on 12/06/2019.   Why: Your ACT team will follow up with you this afternoon. Thanks! Contact information: [address] [place] [address] [phone]           Next level of care provider has access to [facility] Link:no  Safety Planning and Suicide Prevention discussed: Yes,  SPE completed with pt and legal guardian.   Have you used any form of tobacco in the last 30 days? (Cigarettes, Smokeless Tobacco, Cigars, and/or Pipes): Yes  Has patient been referred to the Quitline?: Patient refused referral  Patient has been referred for addiction treatment: Pt. refused referral  Harden Mo, LCSW 12/06/2019, 11:18 AM

## 2019-12-06 NOTE — Discharge Summary (Signed)
Physician Discharge Summary Note  Patient:  Kimberly Boyle is an 24 y.o., female MRN:  [id number] DOB:  1996-03-11 Patient phone:  [phone] (home)  Patient address:   [address] [place] [address],  Total Time spent with patient: 30 minutes  Date of Admission:  11/30/2019 Date of Discharge: 12/06/19  Reason for Admission:  24 year old woman with a history of chronic mental illness came to the emergency room from her group home stating she was having auditory hallucinations with suicidal commands.  She tells me that she has been having symptoms for "about 3 years".  Principal Problem: Schizoaffective disorder [facility]) Discharge Diagnoses: Principal Problem:   Schizoaffective disorder (HCC) Active Problems:   Suicidal ideation   Obesity   Past Psychiatric History: Patient has a past history of chronic psychotic disorder.  Previously diagnosed with schizoaffective disorder.  Last known hospitalization in 2019.  Has had some presentations to our emergency room more recently for symptoms similar to what she is reporting now.  Patient has been on multiple antipsychotics in the past including Risperdal Invega Zyprexa all without adequate benefit.  Clozapine did seem to be of some help in the past.  She does have a past history of extensive self injury and suicidal behavior.  Has a history of marijuana abuse but denies any recent drug use at all.  The MDMA positive drug screen came up a week ago also and she could not come up with an explanation for it then either.  Past Medical History:  Past Medical History:  Diagnosis Date  . Anxiety   . Arthritis   . Bipolar disorder (HCC)   . Depression   . Hidradenitis suppurativa 06/22/2019   Groin   . Major depressive disorder   . Panic disorder   . PID (acute pelvic inflammatory disease) 06/22/2019   06/22/19   . Poor historian 06/22/2019  . Schizophrenia [facility])     Past Surgical History:  Procedure Laterality Date  . COLONOSCOPY WITH  PROPOFOL N/A 09/29/2019   Procedure: COLONOSCOPY WITH PROPOFOL;  Surgeon: Toney Reil, MD;  Location: [facility] ENDOSCOPY;  Service: Gastroenterology;  Laterality: N/A;  . ESOPHAGOGASTRODUODENOSCOPY (EGD) WITH PROPOFOL N/A 09/29/2019   Procedure: ESOPHAGOGASTRODUODENOSCOPY (EGD) WITH PROPOFOL;  Surgeon: Toney Reil, MD;  Location: [facility] ENDOSCOPY;  Service: Gastroenterology;  Laterality: N/A;   Family History:  Family History  Problem Relation Age of Onset  . Post-traumatic stress disorder Mother   . Depression Mother   . Depression Maternal Uncle   . Cancer Maternal Grandmother    Family Psychiatric  History: See above Social History:  Social History   Substance and Sexual Activity  Alcohol Use No     Social History   Substance and Sexual Activity  Drug Use Not Currently  . Types: Marijuana    Social History   Socioeconomic History  . Marital status: Single    Spouse name: Not on file  . Number of children: 0  . Years of education: Not on file  . Highest education level: High school graduate  Occupational History    Comment: unemployed  Tobacco Use  . Smoking status: Current Every Day Smoker    Packs/day: 0.50    Years: 11.00    Pack years: 5.50    Types: Cigarettes  . Smokeless tobacco: Never Used  Substance and Sexual Activity  . Alcohol use: No  . Drug use: Not Currently    Types: Marijuana  . Sexual activity: Not Currently  Other Topics Concern  .  Not on file  Social History Narrative   ** Merged History Encounter **       Social Determinants of Health   Financial Resource Strain:   . Difficulty of Paying Living Expenses: Not on file  Food Insecurity:   . Worried About Programme researcher, broadcasting/film/video in the Last Year: Not on file  . Ran Out of Food in the Last Year: Not on file  Transportation Needs:   . Lack of Transportation (Medical): Not on file  . Lack of Transportation (Non-Medical): Not on file  Physical Activity:   . Days of Exercise per  Week: Not on file  . Minutes of Exercise per Session: Not on file  Stress:   . Feeling of Stress : Not on file  Social Connections:   . Frequency of Communication with Friends and Family: Not on file  . Frequency of Social Gatherings with Friends and Family: Not on file  . Attends Religious Services: Not on file  . Active Member of Clubs or Organizations: Not on file  . Attends Banker Meetings: Not on file  . Marital Status: Not on file    Hospital Course:  Patient remained on the [facility] unit for 5 days. The patient stabilized on medication and therapy. Patient was discharged on Klonopin 0.5 mg TID PRN, Bromocriptine 2.5 mg QHS, Clozaril 400 mg QHS, Colace 100 mg BID, Haldol 7.5 mg BID, Lithium 600 mg BID. Patient has shown improvement with improved mood, affect, sleep, appetite, and interaction. Patient has attended group and participated. Patient has been seen in the day room interacting with peers and staff appropriately. Patient denies any SI/HI/AVH and contracts for safety. Patient agrees to follow up at [facility]. Patient is provided with prescriptions for their medications upon discharge.  Physical Findings: AIMS:  , ,  ,  ,    CIWA:    COWS:     Musculoskeletal: Strength & Muscle Tone: within normal limits Gait & Station: normal Patient leans: N/A  Psychiatric Specialty Exam: Physical Exam  Nursing note and vitals reviewed. Constitutional: She is oriented to person, place, and time. She appears well-developed and well-nourished.  Cardiovascular: Normal rate.  Respiratory: Effort normal.  Musculoskeletal:        General: Normal range of motion.  Neurological: She is alert and oriented to person, place, and time.  Skin: Skin is warm.    Review of Systems  Constitutional: Negative.   HENT: Negative.   Eyes: Negative.   Respiratory: Negative.   Cardiovascular: Negative.   Gastrointestinal: Negative.   Genitourinary: Negative.    Musculoskeletal: Negative.   Skin: Negative.   Neurological: Negative.   Psychiatric/Behavioral: Negative.     Blood pressure (!) 140/98, pulse 81, temperature 98.1 F (36.7 C), temperature source Oral, resp. rate 16, height 5\' 4"  (1.626 m), weight 96.6 kg, last menstrual period 11/22/2019, SpO2 98 %.Body mass index is 36.56 kg/m.   General Appearance: Casual  Eye Contact::  Good  Speech:  Clear and Coherent409  Volume:  Normal  Mood:  Euthymic  Affect:  Congruent  Thought Process:  Goal Directed  Orientation:  Full (Time, Place, and Person)  Thought Content:  Logical  Suicidal Thoughts:  No  Homicidal Thoughts:  No  Memory:  Immediate;   Fair Recent;   Fair Remote;   Fair  Judgement:  Fair  Insight:  Fair  Psychomotor Activity:  Normal  Concentration:  Fair  Recall:  [id number] of Knowledge:Fair  Language: Fair  Akathisia:  No  Handed:  Right  AIMS (if indicated):     Assets:  Desire for Improvement Housing Physical Health Resilience Social Support  Sleep:  Number of Hours: 9  Cognition: WNL  ADL's:  Intact   Have you used any form of tobacco in the last 30 days? (Cigarettes, Smokeless Tobacco, Cigars, and/or Pipes): Yes  Has this patient used any form of tobacco in the last 30 days? (Cigarettes, Smokeless Tobacco, Cigars, and/or Pipes) Yes, Yes, A prescription for an FDA-approved tobacco cessation medication was offered at discharge and the patient refused  Blood Alcohol level:  Lab Results  Component Value Date   [facility] <10 11/29/2019   ETH <10 30/16/0109    Metabolic Disorder Labs:  Lab Results  Component Value Date   HGBA1C 5.1 05/05/2018   MPG 99.67 05/05/2018   Lab Results  Component Value Date   PROLACTIN 98.4 (H) 11/30/2019   PROLACTIN 132.8 (H) 10/25/2017   Lab Results  Component Value Date   CHOL 155 11/30/2019   TRIG 236 (H) 11/30/2019   HDL 30 (L) 11/30/2019   CHOLHDL 5.2 11/30/2019   VLDL 47 (H) 11/30/2019   LDLCALC 78 11/30/2019    LDLCALC 124 (H) 05/07/2018    See Psychiatric Specialty Exam and Suicide Risk Assessment completed by Attending Physician prior to discharge.  Discharge destination:  Other:  Group home  Is patient on multiple antipsychotic therapies at discharge:  Yes,   Do you recommend tapering to monotherapy for antipsychotics?  No   Has Patient had three or more failed trials of antipsychotic monotherapy by history:  Yes,     Recommended Plan for Multiple Antipsychotic Therapies: Second antipsychotic is Clozapine.   Discharge Instructions    Diet - low sodium heart healthy   Complete by: As directed    Increase activity slowly   Complete by: As directed      Allergies as of 12/06/2019      Reactions   Penicillins Diarrhea, Nausea Only   Has patient had a PCN reaction causing immediate rash, facial/tongue/throat swelling, SOB or lightheadedness with hypotension:  no Has patient had a PCN reaction causing severe rash involving mucus membranes or skin necrosis:  no Has patient had a PCN reaction that required hospitalization: no Has patient had a PCN reaction occurring within the last 10 years: no If all of the above answers are "NO", then may proceed with Cephalosporin use.      Medication List    STOP taking these medications   benztropine 1 MG tablet Commonly known as: COGENTIN   busPIRone 10 MG tablet Commonly known as: BUSPAR   dicyclomine 10 MG capsule Commonly known as: BENTYL   omeprazole 40 MG capsule Commonly known as: PriLOSEC   paliperidone 234 MG/1.5ML Susy injection Commonly known as: INVEGA SUSTENNA   traZODone 100 MG tablet Commonly known as: DESYREL     TAKE these medications     Indication  bromocriptine 2.5 MG tablet Commonly known as: PARLODEL Take 1 tablet (2.5 mg total) by mouth at bedtime.  Indication: Prolactinemia   clonazePAM 0.5 MG tablet Commonly known as: KLONOPIN Take 1 tablet (0.5 mg total) by mouth 3 (three) times daily as needed  (anxiety). What changed: when to take this  Indication: Panic Disorder   cloZAPine 100 MG tablet Commonly known as: CLOZARIL Take 4 tablets (400 mg total) by mouth at bedtime.  Indication: Schizoaffective Disorder   docusate sodium 100 MG capsule Commonly known as: COLACE Take 1  capsule (100 mg total) by mouth 2 (two) times daily.  Indication: Constipation   fenofibrate 160 MG tablet Take 1 tablet (160 mg total) by mouth daily. Start taking on: December 07, 2019 What changed:   medication strength  how much to take  Indication: Elevation of Both Cholesterol and Triglycerides in Blood   haloperidol 5 MG tablet Commonly known as: HALDOL Take 1.5 tablets (7.5 mg total) by mouth 2 (two) times daily.  Indication: Schizoaffective   ipratropium 0.06 % nasal spray Commonly known as: ATROVENT Place 2 sprays into both nostrils at bedtime.  Indication: Hayfever   lithium 600 MG capsule Take 1 capsule (600 mg total) by mouth 2 (two) times daily. What changed:   medication strength  how much to take  Indication: Schizoaffective Disorder      Follow-up Information    [facility] Follow up.   Contact information: [address] [place] [address] [phone]           Follow-up recommendations:  Continue activity as tolerated. Continue diet as recommended by your PCP. Ensure to keep all appointments with outpatient providers.  Comments:  Patient is instructed prior to discharge to: Take all medications as prescribed by his/her mental healthcare provider. Report any adverse effects and or reactions from the medicines to his/her outpatient provider promptly. Patient has been instructed & cautioned: To not engage in alcohol and or illegal drug use while on prescription medicines. In the event of worsening symptoms, patient is instructed to call the crisis hotline, 911 and or go to the nearest ED for appropriate evaluation and treatment of  symptoms. To follow-up with his/her primary care provider for your other medical issues, concerns and or health care needs.    Signed: Gerlene Burdock Money, FNP 12/06/2019, 9:33 AM

## 2019-12-06 NOTE — B[facility] Counselor (Signed)
CSW called the legal guardian's desk and work cell in effort to update on pts discharge.  Cell phone voicemail was full.  CSW left HIPAA compliant voicemail on desk phone.  Penni Homans, MSW, LCSW 12/06/2019 9:29 AM

## 2019-12-06 NOTE — Plan of Care (Signed)
  Problem: Group Participation Goal: STG - Patient will engage in groups without prompting or encouragement from LRT x3 group sessions within 5 recreation therapy group sessions Description: STG - Patient will engage in groups without prompting or encouragement from LRT x3 group sessions within 5 recreation therapy group sessions 12/06/2019 1534 by Ernest Haber, LRT Outcome: Not Applicable 11/27/2444 [id number] by Ernest Haber, LRT Outcome: Not Met (add Reason) Note: Patient did not attend any groups.

## 2019-12-08 NOTE — Telephone Encounter (Signed)
Archie Patten said there is nothing in pts chart to release her info. Pt needs to come in office to sign release of info. Called pt back, no answer, and never got the option to leave voice msg.

## 2019-12-08 NOTE — Telephone Encounter (Signed)
Pt called 12/07/19 at 457pm and left a msg asking for her pap results. Called [phone] and spoke to pt and she is aware of results. I mentioned Dr Bonney Aid had sent mychart msg regarding results. She says she does not have access to mychart and wants copy of results mailed to her. I am currently checking with Archie Patten to see if there is release of info in pts chart.

## 2019-12-12 ENCOUNTER — Other Ambulatory Visit: Payer: Self-pay

## 2019-12-12 ENCOUNTER — Encounter: Payer: Self-pay | Admitting: Emergency Medicine

## 2019-12-12 ENCOUNTER — Emergency Department
Admission: EM | Admit: 2019-12-12 | Discharge: 2019-12-12 | Disposition: A | Discharge status: Home / Self Care | Payer: Medicare Other | Attending: Emergency Medicine | Admitting: Emergency Medicine

## 2019-12-12 DIAGNOSIS — F17210 Nicotine dependence, cigarettes, uncomplicated: Secondary | ICD-10-CM | POA: Insufficient documentation

## 2019-12-12 DIAGNOSIS — F209 Schizophrenia, unspecified: Secondary | ICD-10-CM | POA: Diagnosis not present

## 2019-12-12 DIAGNOSIS — Z79899 Other long term (current) drug therapy: Secondary | ICD-10-CM | POA: Diagnosis not present

## 2019-12-12 DIAGNOSIS — Z046 Encounter for general psychiatric examination, requested by authority: Secondary | ICD-10-CM | POA: Diagnosis present

## 2019-12-12 DIAGNOSIS — R4689 Other symptoms and signs involving appearance and behavior: Secondary | ICD-10-CM | POA: Insufficient documentation

## 2019-12-12 LAB — POCT PREGNANCY, URINE: Preg Test, Ur: NEGATIVE

## 2019-12-12 LAB — COMPREHENSIVE METABOLIC PANEL
ALT: 26 U/L (ref 0–44)
AST: 23 U/L (ref 15–41)
Albumin: 3.9 g/dL (ref 3.5–5.0)
Alkaline Phosphatase: 86 U/L (ref 38–126)
Anion gap: 9 (ref 5–15)
BUN: 11 mg/dL (ref 6–20)
CO2: 22 mmol/L (ref 22–32)
Calcium: 9 mg/dL (ref 8.9–10.3)
Chloride: 108 mmol/L (ref 98–111)
Creatinine, Ser: 0.73 mg/dL (ref 0.44–1.00)
GFR calc Af Amer: >60 mL/min (ref >60)
GFR calc non Af Amer: >60 mL/min (ref >60)
Glucose, Bld: 98 mg/dL (ref 70–99)
Potassium: 3.8 mmol/L (ref 3.5–5.1)
Sodium: 139 mmol/L (ref 135–145)
Total Bilirubin: 0.3 mg/dL (ref 0.3–1.2)
Total Protein: 6.8 g/dL (ref 6.5–8.1)

## 2019-12-12 LAB — SALICYLATE LEVEL: Salicylate Lvl: <7 mg/dL — ABNORMAL LOW (ref 7.0–30.0)

## 2019-12-12 LAB — CBC
HCT: 39.3 % (ref 36.0–46.0)
Hemoglobin: 12.5 g/dL (ref 12.0–15.0)
MCH: 29.1 pg (ref 26.0–34.0)
MCHC: 31.8 g/dL (ref 30.0–36.0)
MCV: 91.6 fL (ref 80.0–100.0)
Platelets: 417 10*3/uL — ABNORMAL HIGH (ref 150–400)
RBC: 4.29 MIL/uL (ref 3.87–5.11)
RDW: 13.2 % (ref 11.5–15.5)
WBC: 9.7 10*3/uL (ref 4.0–10.5)
nRBC: 0 % (ref 0.0–0.2)

## 2019-12-12 LAB — URINE DRUG SCREEN, QUALITATIVE (ARMC ONLY)
Amphetamines, Ur Screen: NOT DETECTED
Barbiturates, Ur Screen: NOT DETECTED
Benzodiazepine, Ur Scrn: NOT DETECTED
Cannabinoid 50 Ng, Ur ~~LOC~~: NOT DETECTED
Cocaine Metabolite,Ur ~~LOC~~: NOT DETECTED
MDMA (Ecstasy)Ur Screen: POSITIVE — AB
Methadone Scn, Ur: NOT DETECTED
Opiate, Ur Screen: NOT DETECTED
Phencyclidine (PCP) Ur S: NOT DETECTED
Tricyclic, Ur Screen: NOT DETECTED

## 2019-12-12 LAB — ETHANOL: Alcohol, Ethyl (B): <10 mg/dL (ref <10)

## 2019-12-12 LAB — ACETAMINOPHEN LEVEL: Acetaminophen (Tylenol), Serum: <10 ug/mL — ABNORMAL LOW (ref 10–30)

## 2019-12-12 NOTE — ED Provider Notes (Signed)
[facility] Emergency Department Provider Note ____________________________________________   First MD Initiated Contact with Patient 12/12/19 1514     (approximate)  I have reviewed the triage vital signs and the nursing notes.   HISTORY  Chief Complaint Mental Health Problem    HPI Kimberly Boyle is a 24 y.o. female  with PMH as noted below who presents under involuntary commitment from her group home with an episode of feeling violent and suicidal.  The patient states that she was recently put on multiple psychiatric medications and they often make her sleepy during the afternoon.  She says that she wanted to take a nap but was not allowed to do so by her caregiver at the group home, and then she got upset and began to feel violent.  The patient states like she wanted to hurt others, none started to feel like she wanted to hurt herself.  She states that this has resolved now and she feels calm.  She denies any acute medical complaints.  Past Medical History:  Diagnosis Date  . Anxiety   . Arthritis   . Bipolar disorder (HCC)   . Depression   . Hidradenitis suppurativa 06/22/2019   Groin   . Major depressive disorder   . Panic disorder   . PID (acute pelvic inflammatory disease) 06/22/2019   06/22/19   . Poor historian 06/22/2019  . Schizophrenia [facility])     Patient Active Problem List   Diagnosis Date Noted  . Obesity 12/01/2019  . Schizoaffective disorder (HCC) 11/30/2019  . Abnormal CT scan, colon   . Non-ulcer dyspepsia   . Hidradenitis suppurativa 06/22/2019  . Poor historian 06/22/2019  . PID (acute pelvic inflammatory disease) 06/22/2019  . Psychosis (HCC) 05/25/2019  . Suicidal ideation 07/20/2018  . Tobacco use disorder 05/08/2018  . Schizoaffective disorder, bipolar type (HCC) 05/06/2018  . Suicide attempt (HCC) 08/20/2017  . Cannabis use disorder, moderate, dependence (HCC) 08/20/2017  . Self-inflicted laceration of wrist (HCC)  08/20/2017  . Acetaminophen poisoning 08/20/2017  . Generalized anxiety disorder 01/05/2017  . Depression 01/05/2017    Past Surgical History:  Procedure Laterality Date  . COLONOSCOPY WITH PROPOFOL N/A 09/29/2019   Procedure: COLONOSCOPY WITH PROPOFOL;  Surgeon: Toney Reil, MD;  Location: [facility] ENDOSCOPY;  Service: Gastroenterology;  Laterality: N/A;  . ESOPHAGOGASTRODUODENOSCOPY (EGD) WITH PROPOFOL N/A 09/29/2019   Procedure: ESOPHAGOGASTRODUODENOSCOPY (EGD) WITH PROPOFOL;  Surgeon: Toney Reil, MD;  Location: [facility] ENDOSCOPY;  Service: Gastroenterology;  Laterality: N/A;    Prior to Admission medications   Medication Sig Start Date End Date Taking? Authorizing Provider  bromocriptine (PARLODEL) 2.5 MG tablet Take 1 tablet (2.5 mg total) by mouth at bedtime. 12/06/19   Money, Gerlene Burdock, FNP  clonazePAM (KLONOPIN) 0.5 MG tablet Take 1 tablet (0.5 mg total) by mouth 3 (three) times daily as needed (anxiety). 12/06/19   Money, Gerlene Burdock, FNP  cloZAPine (CLOZARIL) 100 MG tablet Take 4 tablets (400 mg total) by mouth at bedtime. 12/06/19   [name], [name], MD  docusate sodium (COLACE) 100 MG capsule Take 1 capsule (100 mg total) by mouth 2 (two) times daily. 12/06/19   Money, Gerlene Burdock, FNP  fenofibrate 160 MG tablet Take 1 tablet (160 mg total) by mouth daily. 12/07/19   Money, Gerlene Burdock, FNP  haloperidol (HALDOL) 5 MG tablet Take 1.5 tablets (7.5 mg total) by mouth 2 (two) times daily. 12/06/19   Money, Gerlene Burdock, FNP  ipratropium (ATROVENT) 0.06 % nasal spray Place  2 sprays into both nostrils at bedtime. 12/06/19   Money, Lowry Ram, FNP  lithium carbonate 600 MG capsule Take 1 capsule (600 mg total) by mouth 2 (two) times daily. 12/06/19   Money, Lowry Ram, FNP    Allergies Penicillins  Family History  Problem Relation Age of Onset  . Post-traumatic stress disorder Mother   . Depression Mother   . Depression Maternal Uncle   . Cancer Maternal Grandmother     Social  History Social History   Tobacco Use  . Smoking status: Current Every Day Smoker    Packs/day: 0.50    Years: 11.00    Pack years: 5.50    Types: Cigarettes  . Smokeless tobacco: Never Used  Substance Use Topics  . Alcohol use: No  . Drug use: Not Currently    Types: Marijuana    Review of Systems  Constitutional: No fever. Eyes: No visual changes. ENT: No sore throat. Cardiovascular: Denies chest pain. Respiratory: Denies shortness of breath. Gastrointestinal: No vomiting or diarrhea.  Genitourinary: Negative for dysuria.  Musculoskeletal: Negative for back pain. Skin: Negative for rash. Neurological: Negative for headache.   ____________________________________________   PHYSICAL EXAM:  VITAL SIGNS: ED Triage Vitals  Enc Vitals Group     BP 12/12/19 1437 126/87     Pulse Rate 12/12/19 1437 (!) 102     Resp 12/12/19 1437 16     Temp 12/12/19 1437 98.2 F (36.8 C)     Temp Source 12/12/19 1437 Oral     SpO2 12/12/19 1437 98 %     Weight 12/12/19 1434 212 lb 15.4 oz (96.6 kg)     Height --      Head Circumference --      Peak Flow --      Pain Score --      Pain Loc --      Pain Edu? --      Excl. in [place]? --     Constitutional: Alert and oriented. Well appearing and in no acute distress. Eyes: Conjunctivae are normal.  Head: Atraumatic. Nose: No congestion/rhinnorhea. Mouth/Throat: Mucous membranes are moist.   Neck: Normal range of motion.  Cardiovascular: Normal rate, regular rhythm. Grossly normal heart sounds.  Good peripheral circulation. Respiratory: Normal respiratory effort.  No retractions. Lungs CTAB. Gastrointestinal:  No distention.  Musculoskeletal: No lower extremity edema.  Extremities warm and well perfused.  Neurologic:  Normal speech and language. No gross focal neurologic deficits are appreciated.  Skin:  Skin is warm and dry. No rash noted. Psychiatric: Calm and cooperative.  ____________________________________________    LABS (all labs ordered are listed, but only abnormal results are displayed)  Labs Reviewed  SALICYLATE LEVEL - Abnormal; Notable for the following components:      Result Value   Salicylate Lvl <4.2 (*)    All other components within normal limits  ACETAMINOPHEN LEVEL - Abnormal; Notable for the following components:   Acetaminophen (Tylenol), Serum <10 (*)    All other components within normal limits  CBC - Abnormal; Notable for the following components:   Platelets 417 (*)    All other components within normal limits  URINE DRUG SCREEN, QUALITATIVE ([facility] ONLY) - Abnormal; Notable for the following components:   MDMA (Ecstasy)Ur Screen POSITIVE (*)    All other components within normal limits  COMPREHENSIVE METABOLIC PANEL  ETHANOL  POCT PREGNANCY, URINE  POC URINE PREG, ED   ____________________________________________  EKG   ____________________________________________  RADIOLOGY    ____________________________________________  PROCEDURES  Procedure(s) performed: No  Procedures  Critical Care performed: No ____________________________________________   INITIAL IMPRESSION / ASSESSMENT AND PLAN / ED COURSE  Pertinent labs & imaging results that were available during my care of the patient were reviewed by me and considered in my medical decision making (see chart for details).  24 year old female with PMH as noted above presents from her group home under involuntary commitment after an episode in which she states she was feeling violent, thinking about hurting others and herself.  The patient states she is feeling better now.  I reviewed the past medical records in epic.  The patient has diagnosis of schizoaffective disorder and was most recently discharged from here on 2/24 after an episode of auditory hallucinations and thoughts of hurting herself.  On exam today, the patient is calm and cooperative.  She denies any acute symptoms.  Her vital signs are  normal.  The physical exam is unremarkable.  I have ordered psychiatry consultation to evaluate.  ----------------------------------------- 6:30 PM on 12/12/2019 -----------------------------------------  The patient has been evaluated by Dr. Cindi Carbon from psychiatry.  He has rescinded the IVC and cleared the patient for discharge.  She is stable for discharge at this time.  Return precautions provided and she expresses understanding.  We will contact the guardian.  ____________________________________________   FINAL CLINICAL IMPRESSION(S) / ED DIAGNOSES  Final diagnoses:  Behavior concern      NEW MEDICATIONS STARTED DURING THIS VISIT:  New Prescriptions   No medications on file     Note:  This document was prepared using Dragon voice recognition software and may include unintentional dictation errors.    Dionne Bucy, MD 12/12/19 [phone]

## 2019-12-12 NOTE — Consult Note (Signed)
[facility] Face-to-Face Psychiatry Consult   Reason for Consult: IVC for aggressive thoughts Referring Physician: Dr. Marisa Severin Patient Identification: Kimberly Boyle MRN:  [id number] Principal Diagnosis: <principal problem not specified> Diagnosis:  Active Problems:   * No active hospital problems. *   Total Time spent with patient: 45 minutes  Subjective:   Kimberly Boyle is a 24 y.o. female patient who presented with impulsive thoughts.  HPI: Patient is a 24 year old female with a history of schizoaffective disorder who is well-known to Clinical research associate for her multiple recent ER presentations as well as inpatient hospitalization approximately 1 week ago.  At that time patient was complaining of audio hallucinations command in nature as well as severe depression.  Patient was admitted to the inpatient unit and her medications were adjusted including increases of mood stabilizing medications including lithium.  Patient at this time is also on multiple antipsychotics.  Today patient reports that she was having impulses to harm but did not act on them.  She reports that this started when her privileges to nap in her room alone were taken away.  This has been done recently in her group home due to her behaviors which has resulted in more punitive measures.  Patient at this time is denying any suicidal or homicidal ideation.  She also denies any audio hallucinations.  She does report increased appetite and writer observed her eat two dinner trays.  A lengthy conversation was had with the patient regarding her overall diagnosis how it is affecting her life and what her future prospects are.  Patient at this time remains hopeful and is able to engage in meaningful conversation regarding how she would like her life to go about.  At this time she is denying any symptoms of depression or mood instability.  Patient is able to attribute her recent presentation to her frustrations with the group home but remains  hopeful as her act team is currently seeking new placement for her.  Patient reports feeling safe returning to the group home today denies need to stay inpatient for psychiatric hospitalization.  Patient does have follow-up psych services including outpatient care and act team.  Past Psychiatric History: Patient has a history of schizoaffective disorder and has lived in group homes for much of her adult life.  There were periods of time where she has been staying with family this is no longer an option for her due to unstable family living situation.  Patient currently followed by act team  Risk to Self:  No  Risk to Others:  No Prior Inpatient Therapy:  Yes Prior Outpatient Therapy:  Yes  Past Medical History:  Past Medical History:  Diagnosis Date  . Anxiety   . Arthritis   . Bipolar disorder (HCC)   . Depression   . Hidradenitis suppurativa 06/22/2019   Groin   . Major depressive disorder   . Panic disorder   . PID (acute pelvic inflammatory disease) 06/22/2019   06/22/19   . Poor historian 06/22/2019  . Schizophrenia [facility])     Past Surgical History:  Procedure Laterality Date  . COLONOSCOPY WITH PROPOFOL N/A 09/29/2019   Procedure: COLONOSCOPY WITH PROPOFOL;  Surgeon: Toney Reil, MD;  Location: [facility] ENDOSCOPY;  Service: Gastroenterology;  Laterality: N/A;  . ESOPHAGOGASTRODUODENOSCOPY (EGD) WITH PROPOFOL N/A 09/29/2019   Procedure: ESOPHAGOGASTRODUODENOSCOPY (EGD) WITH PROPOFOL;  Surgeon: Toney Reil, MD;  Location: [facility] ENDOSCOPY;  Service: Gastroenterology;  Laterality: N/A;   Family History:  Family History  Problem Relation Age of  Onset  . Post-traumatic stress disorder Mother   . Depression Mother   . Depression Maternal Uncle   . Cancer Maternal Grandmother    Family Psychiatric  History: Reports mother was a Ship broker and that she does not know her father. Social History:  Social History   Substance and Sexual Activity  Alcohol Use No     Social  History   Substance and Sexual Activity  Drug Use Not Currently  . Types: Marijuana    Social History   Socioeconomic History  . Marital status: Single    Spouse name: Not on file  . Number of children: 0  . Years of education: Not on file  . Highest education level: High school graduate  Occupational History    Comment: unemployed  Tobacco Use  . Smoking status: Current Every Day Smoker    Packs/day: 0.50    Years: 11.00    Pack years: 5.50    Types: Cigarettes  . Smokeless tobacco: Never Used  Substance and Sexual Activity  . Alcohol use: No  . Drug use: Not Currently    Types: Marijuana  . Sexual activity: Not Currently  Other Topics Concern  . Not on file  Social History Narrative   ** Merged History Encounter **       Social Determinants of Health   Financial Resource Strain:   . Difficulty of Paying Living Expenses: Not on file  Food Insecurity:   . Worried About Charity fundraiser in the Last Year: Not on file  . Ran Out of Food in the Last Year: Not on file  Transportation Needs:   . Lack of Transportation (Medical): Not on file  . Lack of Transportation (Non-Medical): Not on file  Physical Activity:   . Days of Exercise per Week: Not on file  . Minutes of Exercise per Session: Not on file  Stress:   . Feeling of Stress : Not on file  Social Connections:   . Frequency of Communication with Friends and Family: Not on file  . Frequency of Social Gatherings with Friends and Family: Not on file  . Attends Religious Services: Not on file  . Active Member of Clubs or Organizations: Not on file  . Attends Archivist Meetings: Not on file  . Marital Status: Not on file   Additional Social History: Patient reports living in a group home with 2 other residents.  She does not currently like her caregiver.  She spends her time coloring and drawing.    Allergies:   Allergies  Allergen Reactions  . Penicillins Diarrhea and Nausea Only    Has  patient had a PCN reaction causing immediate rash, facial/tongue/throat swelling, SOB or lightheadedness with hypotension:  no Has patient had a PCN reaction causing severe rash involving mucus membranes or skin necrosis:  no Has patient had a PCN reaction that required hospitalization: no Has patient had a PCN reaction occurring within the last 10 years: no If all of the above answers are "NO", then may proceed with Cephalosporin use.     Labs:  Results for orders placed or performed during the hospital encounter of 12/12/19 (from the past 48 hour(s))  Comprehensive metabolic panel     Status: None   Collection Time: 12/12/19  2:43 PM  Result Value Ref Range   Sodium 139 135 - 145 mmol/L   Potassium 3.8 3.5 - 5.1 mmol/L   Chloride 108 98 - 111 mmol/L   CO2 22 22 -  32 mmol/L   Glucose, Bld 98 70 - 99 mg/dL    Comment: Glucose reference range applies only to samples taken after fasting for at least 8 hours.   BUN 11 6 - 20 mg/dL   Creatinine, Ser 6.29 0.44 - 1.00 mg/dL   Calcium 9.0 8.9 - 47.6 mg/dL   Total Protein 6.8 6.5 - 8.1 g/dL   Albumin 3.9 3.5 - 5.0 g/dL   AST 23 15 - 41 U/L   ALT 26 0 - 44 U/L   Alkaline Phosphatase 86 38 - 126 U/L   Total Bilirubin 0.3 0.3 - 1.2 mg/dL   GFR calc non Af Amer >60 >60 mL/min   GFR calc Af Amer >60 >60 mL/min   Anion gap 9 5 - 15    Comment: Performed at [name], [address], [place], [place] [address]  Ethanol     Status: None   Collection Time: 12/12/19  2:43 PM  Result Value Ref Range   Alcohol, Ethyl (B) <10 <10 mg/dL    Comment: (NOTE) Lowest detectable limit for serum alcohol is 10 mg/dL. For medical purposes only. Performed at [facility], [address] [place], [place] [address]   Salicylate level     Status: Abnormal   Collection Time: 12/12/19  2:43 PM  Result Value Ref Range   Salicylate Lvl <7.0 (L) 7.0 - 30.0 mg/dL    Comment: Performed at [facility], [address]  [place], [place] [address]  Acetaminophen level     Status: Abnormal   Collection Time: 12/12/19  2:43 PM  Result Value Ref Range   Acetaminophen (Tylenol), Serum <10 (L) 10 - 30 ug/mL    Comment: (NOTE) Therapeutic concentrations vary significantly. A range of 10-30 ug/mL  may be an effective concentration for many patients. However, some  are best treated at concentrations outside of this range. Acetaminophen concentrations >150 ug/mL at 4 hours after ingestion  and >50 ug/mL at 12 hours after ingestion are often associated with  toxic reactions. Performed at [facility], [address] [place], [place] [address]   cbc     Status: Abnormal   Collection Time: 12/12/19  2:43 PM  Result Value Ref Range   WBC 9.7 4.0 - 10.5 K/uL   RBC 4.29 3.87 - 5.11 MIL/uL   Hemoglobin 12.5 12.0 - 15.0 g/dL   HCT 17.4 94.4 - 96.7 %   MCV 91.6 80.0 - 100.0 fL   MCH 29.1 26.0 - 34.0 pg   MCHC 31.8 30.0 - 36.0 g/dL   RDW 59.1 63.8 - 46.6 %   Platelets 417 (H) 150 - 400 K/uL   nRBC 0.0 0.0 - 0.2 %    Comment: Performed at [facility], [address] [place], [place] [address]  Urine Drug Screen, Qualitative     Status: Abnormal   Collection Time: 12/12/19  2:43 PM  Result Value Ref Range   Tricyclic, Ur Screen NONE DETECTED NONE DETECTED   Amphetamines, Ur Screen NONE DETECTED NONE DETECTED   MDMA (Ecstasy)Ur Screen POSITIVE (A) NONE DETECTED   Cocaine Metabolite,Ur [place] NONE DETECTED NONE DETECTED   Opiate, Ur Screen NONE DETECTED NONE DETECTED   Phencyclidine (PCP) Ur S NONE DETECTED NONE DETECTED   Cannabinoid 50 Ng, Ur [place] NONE DETECTED NONE DETECTED   Barbiturates, Ur Screen NONE DETECTED NONE DETECTED   Benzodiazepine, Ur Scrn NONE DETECTED NONE DETECTED   Methadone Scn, Ur NONE DETECTED NONE DETECTED  Comment: (NOTE) Tricyclics + metabolites, urine    Cutoff 1000 ng/mL Amphetamines + metabolites, urine  Cutoff 1000 ng/mL MDMA (Ecstasy), urine              Cutoff 500  ng/mL Cocaine Metabolite, urine          Cutoff 300 ng/mL Opiate + metabolites, urine        Cutoff 300 ng/mL Phencyclidine (PCP), urine         Cutoff 25 ng/mL Cannabinoid, urine                 Cutoff 50 ng/mL Barbiturates + metabolites, urine  Cutoff 200 ng/mL Benzodiazepine, urine              Cutoff 200 ng/mL Methadone, urine                   Cutoff 300 ng/mL The urine drug screen provides only a preliminary, unconfirmed analytical test result and should not be used for non-medical purposes. Clinical consideration and professional judgment should be applied to any positive drug screen result due to possible interfering substances. A more specific alternate chemical method must be used in order to obtain a confirmed analytical result. Gas chromatography / mass spectrometry (GC/MS) is the preferred confirmat ory method. Performed at [facility], [address] [place], [place] [address]   Pregnancy, urine POC     Status: None   Collection Time: 12/12/19  2:48 PM  Result Value Ref Range   Preg Test, Ur NEGATIVE NEGATIVE    Comment:        THE SENSITIVITY OF THIS METHODOLOGY IS >24 mIU/mL     No current facility-administered medications for this encounter.   Current Outpatient Medications  Medication Sig Dispense Refill  . bromocriptine (PARLODEL) 2.5 MG tablet Take 1 tablet (2.5 mg total) by mouth at bedtime. 30 tablet 1  . clonazePAM (KLONOPIN) 0.5 MG tablet Take 1 tablet (0.5 mg total) by mouth 3 (three) times daily as needed (anxiety). 30 tablet 0  . cloZAPine (CLOZARIL) 100 MG tablet Take 4 tablets (400 mg total) by mouth at bedtime. 120 tablet 1  . docusate sodium (COLACE) 100 MG capsule Take 1 capsule (100 mg total) by mouth 2 (two) times daily. 10 capsule 0  . fenofibrate 160 MG tablet Take 1 tablet (160 mg total) by mouth daily. 30 tablet 1  . haloperidol (HALDOL) 5 MG tablet Take 1.5 tablets (7.5 mg total) by mouth 2 (two) times daily. 90 tablet 1  .  ipratropium (ATROVENT) 0.06 % nasal spray Place 2 sprays into both nostrils at bedtime. 15 mL 1  . lithium carbonate 600 MG capsule Take 1 capsule (600 mg total) by mouth 2 (two) times daily. 60 capsule 1    Musculoskeletal: Strength & Muscle Tone: within normal limits Gait & Station: normal Patient leans: N/A  Psychiatric Specialty Exam: Physical Exam  Review of Systems  Psychiatric/Behavioral: Negative for dysphoric mood, hallucinations and suicidal ideas.    Blood pressure 126/87, pulse (!) 102, temperature 98.2 F (36.8 C), temperature source Oral, resp. rate 16, weight 96.6 kg, last menstrual period 11/22/2019, SpO2 98 %.Body mass index is 36.56 kg/m.  General Appearance: Casual and Disheveled  Eye Contact:  Fair  Speech:  Clear and Coherent  Volume:  Normal  Mood:  Euthymic  Affect:  Congruent  Thought Process:  Coherent  Orientation:  Full (Time, Place, and Person)  Thought Content:  Logical  Suicidal Thoughts:  No  Homicidal Thoughts:  No  Memory:  Recent;   Fair  Judgement:  Fair  Insight:  Fair  Psychomotor Activity:  Normal  Concentration:  Concentration: Fair  Recall:  Fair  Fund of Knowledge:  Good  Language:  Good  Akathisia:  No  Handed:  Right  AIMS (if indicated):     Assets:  Communication Skills Desire for Improvement Housing Leisure Time Physical Health Resilience Talents/Skills  ADL's:  Intact  Cognition:  WNL  Sleep:        Treatment Plan Summary: 24 year old female with history of schizoaffective disorder presenting with aggressive thoughts in the context of argument at group.  Upon evaluation patient is without any psychotic symptoms at this time and her medications were recently adjusted.  She is denying any mood instability denies any suicidal homicidal ideation.  Patient is not deemed a danger to herself or others and is discharged back to her group home.  IVC rescinded  Diagnosis: Schizoaffective disorder  Disposition: No evidence of  imminent risk to self or others at present.   Patient does not meet criteria for psychiatric inpatient admission. Supportive therapy provided about ongoing stressors. Discussed crisis plan, support from social network, calling 911, coming to the Emergency Department, and calling Suicide Hotline.  Clement Sayres, MD 12/12/2019 6:33 PM

## 2019-12-12 NOTE — ED Triage Notes (Signed)
Arrives with BPD with c/o feeling 'off balance'.  States she is getting really tired during the day and is not allowed to take naps at the Group Home.  Denies SI/ HI.  States she had the urge to hurt herself earlier today, but denies current feeling.  Arrives under IVC for ED evaluation.

## 2019-12-12 NOTE — ED Notes (Signed)
Pt dressed out into burgundy scrubs with this tech and [employer] in the rm. Pt belongings consist of one yellow metal ring, three white metal rings, black shoes, black leggings, black bra, black/white shirt, black jacket and a black scarf. A blue/yellow bag which contains a pink bunny, blue nintendo ds, black wallet, make up, headphones, a hair brush, five books, two bottles of perfume, a bottle of dry shampoo and deodorant. Pt also has a red/black bag that contains a bow of markers, two coloring books, black/white shirt, black shirt, a lighter, a red/blue shirt, black leggings, a grey jacket and a brown dress. Pt calm and cooperative while dressing out. Pt belongings placed into three pt belonging bags and labeled with pt name. Pt denies having any money with her.

## 2019-12-12 NOTE — Discharge Instructions (Addendum)
Ms. Kimberly Boyle should continue to take her medications as prescribed.  She should follow-up with her regular psychiatrist and should return to the ER immediately for new or worsening behavioral concerns, or any danger to self or others.

## 2019-12-12 NOTE — ED Notes (Signed)
Left message for on-call social worker from DSS to call for permission to release pt to group home.

## 2019-12-12 NOTE — ED Notes (Signed)
Pt reports that she started new meds last week while she was at [facility] at [place], and she has been feeling "violent" since that change. She also reports feeling "tired" and "angry." Denies si/hi. Denies any other stressors in her life at this time, asks that the MD prescribe her naps so she can be allowed to take naps at her group home.

## 2019-12-12 NOTE — ED Notes (Signed)
Mellody Life owner of group home made aware of patient being cleared by psych and EDP for discharge. Mellody Life states, "even with her saying she was going to kill herself and others in home?" Kathrynn Running if patient behaviors become erratic and unsafe for her or staff to call 911, but at this point patient has been cleared for discharge and will need someone to pick patient up. Mrs. Joseph Art states she will have someone soon pick up patient.

## 2019-12-12 NOTE — ED Notes (Signed)
Spoke with Para March at group home. She indicates that she does not feel safe with pt in the home as she has been aggressive. She is going to call owner of group home and call back.

## 2019-12-12 NOTE — ED Notes (Signed)
Received callback from Lucretia Roers, [facility]. Permission given to return pt to group home.

## 2019-12-13 ENCOUNTER — Other Ambulatory Visit: Payer: Self-pay

## 2019-12-13 ENCOUNTER — Emergency Department
Admission: EM | Admit: 2019-12-13 | Discharge: 2019-12-14 | Disposition: A | Discharge status: Home / Self Care | Payer: Medicare Other | Attending: Emergency Medicine | Admitting: Emergency Medicine

## 2019-12-13 ENCOUNTER — Encounter: Payer: Self-pay | Admitting: Emergency Medicine

## 2019-12-13 DIAGNOSIS — R933 Abnormal findings on diagnostic imaging of other parts of digestive tract: Secondary | ICD-10-CM | POA: Diagnosis present

## 2019-12-13 DIAGNOSIS — F17200 Nicotine dependence, unspecified, uncomplicated: Secondary | ICD-10-CM | POA: Diagnosis present

## 2019-12-13 DIAGNOSIS — F259 Schizoaffective disorder, unspecified: Secondary | ICD-10-CM | POA: Diagnosis present

## 2019-12-13 DIAGNOSIS — L732 Hidradenitis suppurativa: Secondary | ICD-10-CM | POA: Diagnosis present

## 2019-12-13 DIAGNOSIS — F411 Generalized anxiety disorder: Secondary | ICD-10-CM | POA: Diagnosis present

## 2019-12-13 DIAGNOSIS — Z20822 Contact with and (suspected) exposure to covid-19: Secondary | ICD-10-CM | POA: Diagnosis not present

## 2019-12-13 DIAGNOSIS — F250 Schizoaffective disorder, bipolar type: Secondary | ICD-10-CM | POA: Diagnosis present

## 2019-12-13 DIAGNOSIS — F329 Major depressive disorder, single episode, unspecified: Secondary | ICD-10-CM | POA: Diagnosis not present

## 2019-12-13 DIAGNOSIS — S61519A Laceration without foreign body of unspecified wrist, initial encounter: Secondary | ICD-10-CM | POA: Diagnosis present

## 2019-12-13 DIAGNOSIS — E669 Obesity, unspecified: Secondary | ICD-10-CM | POA: Diagnosis present

## 2019-12-13 DIAGNOSIS — F32A Depression, unspecified: Secondary | ICD-10-CM

## 2019-12-13 DIAGNOSIS — R4689 Other symptoms and signs involving appearance and behavior: Secondary | ICD-10-CM | POA: Diagnosis present

## 2019-12-13 DIAGNOSIS — T391X1A Poisoning by 4-Aminophenol derivatives, accidental (unintentional), initial encounter: Secondary | ICD-10-CM | POA: Diagnosis present

## 2019-12-13 DIAGNOSIS — T1491XA Suicide attempt, initial encounter: Secondary | ICD-10-CM | POA: Diagnosis present

## 2019-12-13 DIAGNOSIS — F29 Unspecified psychosis not due to a substance or known physiological condition: Secondary | ICD-10-CM | POA: Diagnosis present

## 2019-12-13 DIAGNOSIS — Z79899 Other long term (current) drug therapy: Secondary | ICD-10-CM | POA: Insufficient documentation

## 2019-12-13 DIAGNOSIS — X789XXA Intentional self-harm by unspecified sharp object, initial encounter: Secondary | ICD-10-CM | POA: Diagnosis present

## 2019-12-13 DIAGNOSIS — R45851 Suicidal ideations: Secondary | ICD-10-CM | POA: Diagnosis not present

## 2019-12-13 DIAGNOSIS — F17210 Nicotine dependence, cigarettes, uncomplicated: Secondary | ICD-10-CM | POA: Insufficient documentation

## 2019-12-13 DIAGNOSIS — N730 Acute parametritis and pelvic cellulitis: Secondary | ICD-10-CM | POA: Diagnosis present

## 2019-12-13 DIAGNOSIS — K30 Functional dyspepsia: Secondary | ICD-10-CM | POA: Diagnosis present

## 2019-12-13 DIAGNOSIS — F1220 Cannabis dependence, uncomplicated: Secondary | ICD-10-CM | POA: Diagnosis present

## 2019-12-13 DIAGNOSIS — Z789 Other specified health status: Secondary | ICD-10-CM | POA: Diagnosis present

## 2019-12-13 LAB — COMPREHENSIVE METABOLIC PANEL
ALT: 28 U/L (ref 0–44)
AST: 24 U/L (ref 15–41)
Albumin: 4.1 g/dL (ref 3.5–5.0)
Alkaline Phosphatase: 87 U/L (ref 38–126)
Anion gap: 12 (ref 5–15)
BUN: 15 mg/dL (ref 6–20)
CO2: 23 mmol/L (ref 22–32)
Calcium: 9.1 mg/dL (ref 8.9–10.3)
Chloride: 103 mmol/L (ref 98–111)
Creatinine, Ser: 0.86 mg/dL (ref 0.44–1.00)
GFR calc Af Amer: >60 mL/min (ref >60)
GFR calc non Af Amer: >60 mL/min (ref >60)
Glucose, Bld: 101 mg/dL — ABNORMAL HIGH (ref 70–99)
Potassium: 3.5 mmol/L (ref 3.5–5.1)
Sodium: 138 mmol/L (ref 135–145)
Total Bilirubin: 0.2 mg/dL — ABNORMAL LOW (ref 0.3–1.2)
Total Protein: 7 g/dL (ref 6.5–8.1)

## 2019-12-13 LAB — CBC
HCT: 38.1 % (ref 36.0–46.0)
Hemoglobin: 12.2 g/dL (ref 12.0–15.0)
MCH: 29.4 pg (ref 26.0–34.0)
MCHC: 32 g/dL (ref 30.0–36.0)
MCV: 91.8 fL (ref 80.0–100.0)
Platelets: 414 10*3/uL — ABNORMAL HIGH (ref 150–400)
RBC: 4.15 MIL/uL (ref 3.87–5.11)
RDW: 13.2 % (ref 11.5–15.5)
WBC: 10.9 10*3/uL — ABNORMAL HIGH (ref 4.0–10.5)
nRBC: 0 % (ref 0.0–0.2)

## 2019-12-13 LAB — SALICYLATE LEVEL: Salicylate Lvl: <7 mg/dL — ABNORMAL LOW (ref 7.0–30.0)

## 2019-12-13 LAB — ACETAMINOPHEN LEVEL: Acetaminophen (Tylenol), Serum: <10 ug/mL — ABNORMAL LOW (ref 10–30)

## 2019-12-13 LAB — ETHANOL: Alcohol, Ethyl (B): <10 mg/dL (ref <10)

## 2019-12-13 NOTE — ED Notes (Signed)
Patient offered and given a snack tray and something to drink.

## 2019-12-13 NOTE — ED Provider Notes (Signed)
[facility] Emergency Department Provider Note  Time seen: 6:31 PM  I have reviewed the triage vital signs and the nursing notes.   HISTORY  Chief Complaint Suicidal   HPI Kimberly Boyle is a 24 y.o. female with a past medical history of anxiety, thrives, bipolar, depression, schizophrenia, presents to the emergency department for depression.  Patient states she is getting in arguments with the people at her group home.  States the caregiver at the group home treats everyone else "like gold" and treats her poorly.  Patient states she does not want to go back to the group home.  No medical complaints.  Asking for something to eat and a blanket.   Past Medical History:  Diagnosis Date  . Anxiety   . Arthritis   . Bipolar disorder (HCC)   . Depression   . Hidradenitis suppurativa 06/22/2019   Groin   . Major depressive disorder   . Panic disorder   . PID (acute pelvic inflammatory disease) 06/22/2019   06/22/19   . Poor historian 06/22/2019  . Schizophrenia [facility])     Patient Active Problem List   Diagnosis Date Noted  . Behavior concern   . Obesity 12/01/2019  . Schizoaffective disorder (HCC) 11/30/2019  . Abnormal CT scan, colon   . Non-ulcer dyspepsia   . Hidradenitis suppurativa 06/22/2019  . Poor historian 06/22/2019  . PID (acute pelvic inflammatory disease) 06/22/2019  . Psychosis (HCC) 05/25/2019  . Suicidal ideation 07/20/2018  . Tobacco use disorder 05/08/2018  . Schizoaffective disorder, bipolar type (HCC) 05/06/2018  . Suicide attempt (HCC) 08/20/2017  . Cannabis use disorder, moderate, dependence (HCC) 08/20/2017  . Self-inflicted laceration of wrist (HCC) 08/20/2017  . Acetaminophen poisoning 08/20/2017  . Generalized anxiety disorder 01/05/2017  . Depression 01/05/2017    Past Surgical History:  Procedure Laterality Date  . COLONOSCOPY WITH PROPOFOL N/A 09/29/2019   Procedure: COLONOSCOPY WITH PROPOFOL;  Surgeon: Toney Reil, MD;  Location: [facility] ENDOSCOPY;  Service: Gastroenterology;  Laterality: N/A;  . ESOPHAGOGASTRODUODENOSCOPY (EGD) WITH PROPOFOL N/A 09/29/2019   Procedure: ESOPHAGOGASTRODUODENOSCOPY (EGD) WITH PROPOFOL;  Surgeon: Toney Reil, MD;  Location: [facility] ENDOSCOPY;  Service: Gastroenterology;  Laterality: N/A;    Prior to Admission medications   Medication Sig Start Date End Date Taking? Authorizing Provider  bromocriptine (PARLODEL) 2.5 MG tablet Take 1 tablet (2.5 mg total) by mouth at bedtime. 12/06/19   Money, Gerlene Burdock, FNP  clonazePAM (KLONOPIN) 0.5 MG tablet Take 1 tablet (0.5 mg total) by mouth 3 (three) times daily as needed (anxiety). 12/06/19   Money, Gerlene Burdock, FNP  cloZAPine (CLOZARIL) 100 MG tablet Take 4 tablets (400 mg total) by mouth at bedtime. 12/06/19   [name], [name], MD  docusate sodium (COLACE) 100 MG capsule Take 1 capsule (100 mg total) by mouth 2 (two) times daily. 12/06/19   Money, Gerlene Burdock, FNP  fenofibrate 160 MG tablet Take 1 tablet (160 mg total) by mouth daily. 12/07/19   Money, Gerlene Burdock, FNP  haloperidol (HALDOL) 5 MG tablet Take 1.5 tablets (7.5 mg total) by mouth 2 (two) times daily. 12/06/19   Money, Gerlene Burdock, FNP  ipratropium (ATROVENT) 0.06 % nasal spray Place 2 sprays into both nostrils at bedtime. 12/06/19   Money, Gerlene Burdock, FNP  lithium carbonate 600 MG capsule Take 1 capsule (600 mg total) by mouth 2 (two) times daily. 12/06/19   Money, Gerlene Burdock, FNP    Allergies  Allergen Reactions  . Penicillins Diarrhea  and Nausea Only    Has patient had a PCN reaction causing immediate rash, facial/tongue/throat swelling, SOB or lightheadedness with hypotension:  no Has patient had a PCN reaction causing severe rash involving mucus membranes or skin necrosis:  no Has patient had a PCN reaction that required hospitalization: no Has patient had a PCN reaction occurring within the last 10 years: no If all of the above answers are "NO", then may proceed with  Cephalosporin use.     Family History  Problem Relation Age of Onset  . Post-traumatic stress disorder Mother   . Depression Mother   . Depression Maternal Uncle   . Cancer Maternal Grandmother     Social History Social History   Tobacco Use  . Smoking status: Current Every Day Smoker    Packs/day: 0.50    Years: 11.00    Pack years: 5.50    Types: Cigarettes  . Smokeless tobacco: Never Used  Substance Use Topics  . Alcohol use: No  . Drug use: Not Currently    Types: Marijuana    Review of Systems Constitutional: Negative for fever. Cardiovascular: Negative for chest pain. Respiratory: Negative for shortness of breath. Gastrointestinal: Negative for abdominal pain, vomiting and diarrhea. Genitourinary: Last menstrual period was 3 weeks ago. Musculoskeletal: Negative for musculoskeletal complaints Neurological: Negative for headache All other ROS negative  ____________________________________________   PHYSICAL EXAM:  VITAL SIGNS: ED Triage Vitals  Enc Vitals Group     BP 12/13/19 1800 118/70     Pulse Rate 12/13/19 1800 (!) 125     Resp 12/13/19 1800 16     Temp 12/13/19 1800 98.8 F (37.1 C)     Temp Source 12/13/19 1800 Oral     SpO2 12/13/19 1800 97 %     Weight 12/13/19 1801 211 lb 10.3 oz (96 kg)     Height 12/13/19 1801 5\' 4"  (1.626 m)     Head Circumference --      Peak Flow --      Pain Score 12/13/19 1801 0     Pain Loc --      Pain Edu? --      Excl. in GC? --    Constitutional: Alert and oriented. Well appearing and in no distress. Eyes: Normal exam ENT      Head: Normocephalic and atraumatic.      Mouth/Throat: Mucous membranes are moist. Cardiovascular: Normal rate, regular rhythm.  Respiratory: Normal respiratory effort without tachypnea nor retractions. Breath sounds are clear  Gastrointestinal: Soft and nontender. No distention. Musculoskeletal: Nontender with normal range of motion in all extremities.  Neurologic:  Normal speech  and language. No gross focal neurologic deficits  Skin:  Skin is warm, dry and intact.  Psychiatric: Mood and affect are normal.  ____________________________________________   INITIAL IMPRESSION / ASSESSMENT AND PLAN / ED COURSE  Pertinent labs & imaging results that were available during my care of the patient were reviewed by me and considered in my medical decision making (see chart for details).   Patient presents emergency department for depression, upset that she is getting this treated at a group home.  When asked specifically about suicidal thoughts, no active plan to hurt herself states she does not want to go back to the group home.  Currently patient appears well, no distress, asking for something to eat asking for a blanket.  We will check labs consult psychiatry in TTS.  Psychiatry has seen and believe the patient safe for discharge home.  Group home is refusing to take the patient back at this time.  They will reassess in the morning.  Medical work-up largely nonrevealing.  Kimberly Boyle was evaluated in Emergency Department on 12/13/2019 for the symptoms described in the history of present illness. She was evaluated in the context of the global COVID-19 pandemic, which necessitated consideration that the patient might be at risk for infection with the SARS-CoV-2 virus that causes COVID-19. Institutional protocols and algorithms that pertain to the evaluation of patients at risk for COVID-19 are in a state of rapid change based on information released by regulatory bodies including the CDC and federal and state organizations. These policies and algorithms were followed during the patient's care in the ED.  ____________________________________________   FINAL CLINICAL IMPRESSION(S) / ED DIAGNOSES  Depression   Harvest Dark, MD 12/13/19 2214

## 2019-12-13 NOTE — ED Notes (Signed)
Holding off on COVID swab unless pt will be staying in ED for an extended period of time. MD verbalized agreement with plan.

## 2019-12-13 NOTE — BH Assessment (Signed)
Writer received phone call from [employer] staff, [employer] ([phone]) stating the patient continues to voice SI and they are no longer able to manage her behaviors. She's made multiple phone calls to 911 and has had several visits from law enforcement. Today she told the staff she was going to cut her self. Writer asked if she made any attempts or gestures, the staff member replied by saying, she tells them or call 911. Writer asked several different ways and still no clear answer if she has made any attempts. They are giving her, her 30 day notice today. She further reports, the patient have an ACT Team and they have made them aware of this. They tried to contact the guardian, but hasn't reach her.

## 2019-12-13 NOTE — ED Notes (Signed)
VOL/ Consult ordered  

## 2019-12-13 NOTE — Consult Note (Signed)
[facility] Face-to-Face Psychiatry Consult   Reason for Consult: Suicidal  Referring Physician: Dr. Orie Rout Patient Identification: Kimberly Boyle MRN:  [id number] Principal Diagnosis: <principal problem not specified> Diagnosis:  Active Problems:   Generalized anxiety disorder   Depression   Suicide attempt (HCC)   Cannabis use disorder, moderate, dependence (HCC)   Self-inflicted laceration of wrist (HCC)   Acetaminophen poisoning   Schizoaffective disorder, bipolar type (HCC)   Tobacco use disorder   Suicidal ideation   Psychosis (HCC)   Hidradenitis suppurativa   Poor historian   PID (acute pelvic inflammatory disease)   Abnormal CT scan, colon   Non-ulcer dyspepsia   Schizoaffective disorder (HCC)   Obesity   Behavior concern   Total Time spent with patient: 30 minutes  Subjective: "I was gonna cut myself when my caregiver went to sleep" Kimberly Boyle is a 24 y.o. female patient presented to [facility] ED via POV from her group home voluntarily. The patient resides at [facility]. The patient does not like her GH and does not want to live there. The [facility] owner is refusing to take the patient back. The [facility] owner discussed earlier that she had given the patient 30 days' notice to leave the group home. The patient was seen face-to-face by this provider; chart reviewed and consulted with Dr.Paduchowski and Dr. Toni Amend on 12/13/2019 due to the patient's care. It was discussed with both providers that the patient does not meet the criteria for psychiatric inpatient admission, and she will be discharged back to her group home. The patient is alert and oriented x 4, calm, cooperative, and mood-congruent with affect on evaluation. The patient does not appear to be responding to internal or external stimuli. Neither is the patient presenting with any delusional thinking. The patient denies auditory and visual hallucinations. The patient denies suicidal ideations initially once she was  told she would be returning to her GH. She voiced, "I do not want to go back there. I do not like it over there. If I go back there, I will hurt myself." The patient was reminded that is why she is in a GH. They have staff members to monitor and keep her safe." She denies homicidal or self-harm ideations. The patient is not presenting with any psychotic or paranoid behaviors. During an encounter with the patient, she was able to answer questions appropriately.  Plan: The patient does not meet the criteria for psychiatric inpatient admission, and she will be discharged back to her group home. HPI: Per Dr. Lenard Boyle: Kimberly Boyle is a 24 y.o. female with a past medical history of anxiety, thrives, bipolar, depression, schizophrenia, presents to the emergency department for depression.  Patient states she is getting in arguments with the people at her group home.  States the caregiver at the group home treats everyone else "like gold" and treats her poorly.  Patient states she does not want to go back to the group home.  No medical complaints.  Asking for something to eat and a blanket.  Past Psychiatric History:  Anxiety Bipolar disorder (HCC) Depression Major depressive disorder Panic disorder Schizophrenia (HCC)  Risk to Self:   No Risk to Others:  No Prior Inpatient Therapy:  Yes Prior Outpatient Therapy:  Yes  Past Medical History:  Past Medical History:  Diagnosis Date  . Anxiety   . Arthritis   . Bipolar disorder (HCC)   . Depression   . Hidradenitis suppurativa 06/22/2019   Groin   . Major  depressive disorder   . Panic disorder   . PID (acute pelvic inflammatory disease) 06/22/2019   06/22/19   . Poor historian 06/22/2019  . Schizophrenia [facility])     Past Surgical History:  Procedure Laterality Date  . COLONOSCOPY WITH PROPOFOL N/A 09/29/2019   Procedure: COLONOSCOPY WITH PROPOFOL;  Surgeon: Toney Reil, MD;  Location: [facility] ENDOSCOPY;  Service: Gastroenterology;   Laterality: N/A;  . ESOPHAGOGASTRODUODENOSCOPY (EGD) WITH PROPOFOL N/A 09/29/2019   Procedure: ESOPHAGOGASTRODUODENOSCOPY (EGD) WITH PROPOFOL;  Surgeon: Toney Reil, MD;  Location: [facility] ENDOSCOPY;  Service: Gastroenterology;  Laterality: N/A;   Family History:  Family History  Problem Relation Age of Onset  . Post-traumatic stress disorder Mother   . Depression Mother   . Depression Maternal Uncle   . Cancer Maternal Grandmother    Family Psychiatric  History:  Social History:  Social History   Substance and Sexual Activity  Alcohol Use No     Social History   Substance and Sexual Activity  Drug Use Not Currently  . Types: Marijuana    Social History   Socioeconomic History  . Marital status: Single    Spouse name: Not on file  . Number of children: 0  . Years of education: Not on file  . Highest education level: High school graduate  Occupational History    Comment: unemployed  Tobacco Use  . Smoking status: Current Every Day Smoker    Packs/day: 0.50    Years: 11.00    Pack years: 5.50    Types: Cigarettes  . Smokeless tobacco: Never Used  Substance and Sexual Activity  . Alcohol use: No  . Drug use: Not Currently    Types: Marijuana  . Sexual activity: Not Currently  Other Topics Concern  . Not on file  Social History Narrative   ** Merged History Encounter **       Social Determinants of Health   Financial Resource Strain:   . Difficulty of Paying Living Expenses: Not on file  Food Insecurity:   . Worried About Programme researcher, broadcasting/film/video in the Last Year: Not on file  . Ran Out of Food in the Last Year: Not on file  Transportation Needs:   . Lack of Transportation (Medical): Not on file  . Lack of Transportation (Non-Medical): Not on file  Physical Activity:   . Days of Exercise per Week: Not on file  . Minutes of Exercise per Session: Not on file  Stress:   . Feeling of Stress : Not on file  Social Connections:   . Frequency of Communication  with Friends and Family: Not on file  . Frequency of Social Gatherings with Friends and Family: Not on file  . Attends Religious Services: Not on file  . Active Member of Clubs or Organizations: Not on file  . Attends Banker Meetings: Not on file  . Marital Status: Not on file   Additional Social History:    Allergies:   Allergies  Allergen Reactions  . Penicillins Diarrhea and Nausea Only    Has patient had a PCN reaction causing immediate rash, facial/tongue/throat swelling, SOB or lightheadedness with hypotension:  no Has patient had a PCN reaction causing severe rash involving mucus membranes or skin necrosis:  no Has patient had a PCN reaction that required hospitalization: no Has patient had a PCN reaction occurring within the last 10 years: no If all of the above answers are "NO", then may proceed with Cephalosporin use.  Labs:  Results for orders placed or performed during the hospital encounter of 12/13/19 (from the past 48 hour(s))  Comprehensive metabolic panel     Status: Abnormal   Collection Time: 12/13/19  6:08 PM  Result Value Ref Range   Sodium 138 135 - 145 mmol/L   Potassium 3.5 3.5 - 5.1 mmol/L   Chloride 103 98 - 111 mmol/L   CO2 23 22 - 32 mmol/L   Glucose, Bld 101 (H) 70 - 99 mg/dL    Comment: Glucose reference range applies only to samples taken after fasting for at least 8 hours.   BUN 15 6 - 20 mg/dL   Creatinine, Ser 0.86 0.44 - 1.00 mg/dL   Calcium 9.1 8.9 - 10.3 mg/dL   Total Protein 7.0 6.5 - 8.1 g/dL   Albumin 4.1 3.5 - 5.0 g/dL   AST 24 15 - 41 U/L   ALT 28 0 - 44 U/L   Alkaline Phosphatase 87 38 - 126 U/L   Total Bilirubin 0.2 (L) 0.3 - 1.2 mg/dL   GFR calc non Af Amer >60 >60 mL/min   GFR calc Af Amer >60 >60 mL/min   Anion gap 12 5 - 15    Comment: Performed at [facility], [address], [place], [place] [address]  Ethanol     Status: None   Collection Time: 12/13/19  6:08 PM  Result Value Ref Range    Alcohol, Ethyl (B) <10 <10 mg/dL    Comment: (NOTE) Lowest detectable limit for serum alcohol is 10 mg/dL. For medical purposes only. Performed at [facility], [address] [place], [place] [address]   Salicylate level     Status: Abnormal   Collection Time: 12/13/19  6:08 PM  Result Value Ref Range   Salicylate Lvl <0.0 (L) 7.0 - 30.0 mg/dL    Comment: Performed at [facility], [address] [place], [place] [address]  Acetaminophen level     Status: Abnormal   Collection Time: 12/13/19  6:08 PM  Result Value Ref Range   Acetaminophen (Tylenol), Serum <10 (L) 10 - 30 ug/mL    Comment: (NOTE) Therapeutic concentrations vary significantly. A range of 10-30 ug/mL  may be an effective concentration for many patients. However, some  are best treated at concentrations outside of this range. Acetaminophen concentrations >150 ug/mL at 4 hours after ingestion  and >50 ug/mL at 12 hours after ingestion are often associated with  toxic reactions. Performed at [facility], [address] [place], [place] [address]   cbc     Status: Abnormal   Collection Time: 12/13/19  6:08 PM  Result Value Ref Range   WBC 10.9 (H) 4.0 - 10.5 K/uL   RBC 4.15 3.87 - 5.11 MIL/uL   Hemoglobin 12.2 12.0 - 15.0 g/dL   HCT 38.1 36.0 - 46.0 %   MCV 91.8 80.0 - 100.0 fL   MCH 29.4 26.0 - 34.0 pg   MCHC 32.0 30.0 - 36.0 g/dL   RDW 13.2 11.5 - 15.5 %   Platelets 414 (H) 150 - 400 K/uL   nRBC 0.0 0.0 - 0.2 %    Comment: Performed at [facility], [address] [place], [place] [address]    No current facility-administered medications for this encounter.   Current Outpatient Medications  Medication Sig Dispense Refill  . bromocriptine (PARLODEL) 2.5 MG tablet Take 1 tablet (2.5 mg total) by mouth at bedtime. 30 tablet 1  . clonazePAM (  KLONOPIN) 0.5 MG tablet Take 1 tablet (0.5 mg total) by mouth 3 (three) times daily as needed (anxiety). 30 tablet 0  .  cloZAPine (CLOZARIL) 100 MG tablet Take 4 tablets (400 mg total) by mouth at bedtime. 120 tablet 1  . docusate sodium (COLACE) 100 MG capsule Take 1 capsule (100 mg total) by mouth 2 (two) times daily. 10 capsule 0  . fenofibrate 160 MG tablet Take 1 tablet (160 mg total) by mouth daily. 30 tablet 1  . haloperidol (HALDOL) 5 MG tablet Take 1.5 tablets (7.5 mg total) by mouth 2 (two) times daily. 90 tablet 1  . ipratropium (ATROVENT) 0.06 % nasal spray Place 2 sprays into both nostrils at bedtime. 15 mL 1  . lithium carbonate 600 MG capsule Take 1 capsule (600 mg total) by mouth 2 (two) times daily. 60 capsule 1    Musculoskeletal: Strength & Muscle Tone: within normal limits Gait & Station: normal Patient leans: N/A  Psychiatric Specialty Exam: Physical Exam  Nursing note and vitals reviewed. Constitutional: She appears well-developed and well-nourished.  Cardiovascular: Normal rate.  Respiratory: Effort normal.  Musculoskeletal:        General: Normal range of motion.     Cervical back: Normal range of motion and neck supple.  Neurological: She is alert.    Review of Systems  Psychiatric/Behavioral: The patient is nervous/anxious.   All other systems reviewed and are negative.   Blood pressure (!) 131/91, pulse 99, temperature 97.6 F (36.4 C), temperature source Oral, resp. rate 20, height 5\' 4"  (1.626 m), weight 96 kg, last menstrual period 11/22/2019, SpO2 99 %.Body mass index is 36.33 kg/m.  General Appearance: Casual  Eye Contact:  Good  Speech:  Clear and Coherent  Volume:  Normal  Mood:  NA  Affect:  Appropriate  Thought Process:  Coherent  Orientation:  Full (Time, Place, and Person)  Thought Content:  WDL and Logical  Suicidal Thoughts:  No  Homicidal Thoughts:  No  Memory:  Immediate;   Good Recent;   Good Remote;   Good  Judgement:  Fair  Insight:  Lacking  Psychomotor Activity:  Normal  Concentration:  Concentration: Good and Attention Span: Good  Recall:   Good  Fund of Knowledge:  Good  Language:  Good  Akathisia:  Negative  Handed:  Right  AIMS (if indicated):     Assets:  Desire for Improvement Leisure Time Resilience  ADL's:  Intact  Cognition:  WNL  Sleep:    Good     Treatment Plan Summary: The patient does not meet the criteria for psychiatric inpatient admission and she will be discharged back to her group home.   Disposition: The patient does not meet the criteria for psychiatric inpatient admission and she will be discharged back to her group home. 01/20/2020, NP 12/13/2019 11:33 PM

## 2019-12-13 NOTE — BH Assessment (Signed)
Writer contacted the on call [facility] Social Worker Fleet Contras back regarding the patient's group home refusing to take the patient back, Clinical research associate provided [place] with Group home Supervisor's number [phone]) . Fleet Contras communicated that she will contact her supervisor to start a APS report and will Higher education careers adviser back.   On Call [facility] Social Worker Fleet Contras contacted Clinical research associate back and reported that patient's guardian Michail Sermon will contact TTS back tomorrow morning to develop a plan with group home to get patient placed.

## 2019-12-13 NOTE — BH Assessment (Addendum)
Writer attempted to contact patient's legal guardian Michail Sermon at [phone], unsuccessful HIPAA compliant voice was left to return phone call   Writer contacted on call [facility] Social Worker Fleet Contras and updated recommendation that was given by Psyc NP that patient does not meet inpatient criteria, DSS Social Worker Fleet Contras will relay information to legal guardian Sherald Hess contacted [facility] and spoke with Cherlyn Labella regarding group home picking patient up from the ED, Jeneatte communicated that she will have to contact her supervisor regarding transporting the patient back.   Cassandra (group home supervisor) contacted writer back and is refusing to take patient back "this has been going on for a month now her going back and forth."

## 2019-12-13 NOTE — ED Notes (Signed)
Pt dressed out into hospital acquired attire via this RN and EDT, Mimi.  Belongings placed in labeled bags (2) and handed off to [employer], Amy.  Belongings include:  Silver Rings (4) Black Flip Flops Purse Striped Dress [employer]

## 2019-12-13 NOTE — Telephone Encounter (Signed)
Pt hasn't called.  Msg closed.

## 2019-12-13 NOTE — ED Triage Notes (Signed)
Pt in via BPD voluntarily from [facility].  Pt states, "My caregiver is driving me to suicide.  She treats everything else like gold and treats me like crap.  I was told the next time I go to a mental hospital I wouldn't be allowed back to the group home so I took it upon myself to check in to a mental hospital."  Calm, cooperative, ambulatory to triage.  NAD noted at this time.

## 2019-12-14 DIAGNOSIS — F329 Major depressive disorder, single episode, unspecified: Secondary | ICD-10-CM | POA: Diagnosis not present

## 2019-12-14 LAB — RESPIRATORY PANEL BY RT PCR (FLU A&B, COVID)
Influenza A by PCR: NEGATIVE
Influenza B by PCR: NEGATIVE
SARS Coronavirus 2 by RT PCR: NEGATIVE

## 2019-12-14 NOTE — ED Provider Notes (Signed)
Cleared by Dr. Cindi Carbon for discharge   Jene Every, MD 12/14/19 [phone]

## 2019-12-14 NOTE — ED Notes (Signed)
Patient was given a ginger ale at this time.

## 2019-12-14 NOTE — BH Assessment (Signed)
Assessment Note  Kimberly Boyle is an 24 y.o. female presenting to [facility] ED voluntarily from her group home. Per triage note Pt in via BPD voluntarily from [facility].  Pt states, "My caregiver is driving me to suicide.  She treats everything else like gold and treats me like crap.  I was told the next time I go to a mental hospital I wouldn't be allowed back to the group home so I took it upon myself to check in to a mental hospital."  Calm, cooperative, ambulatory to triage. During assessment patient appeared drowsy as she had just had her medication, patient reported "my caregiver is bullying me into suicide, she yells at me, every little thing I do she yells at me." Patient continued to report that she does not like her group home and denied wanting to hurt herself until she was told by Psyc NP that she will return back then patient reported "I do not want to go back there. I do not like it over there. If I go back there, I will hurt myself." Patient's group home is currently giving the patient a 30 day notice. Patient denied HI/AH/VH and does not appear to be responding to any internal and external stimuli.   Per Psyc NP the patient does not meet the criteria for psychiatric inpatient admission, and she will be discharged back to her group home.   Diagnosis: Anxiety, Bipolar Disorder  Past Medical History:  Past Medical History:  Diagnosis Date  . Anxiety   . Arthritis   . Bipolar disorder (HCC)   . Depression   . Hidradenitis suppurativa 06/22/2019   Groin   . Major depressive disorder   . Panic disorder   . PID (acute pelvic inflammatory disease) 06/22/2019   06/22/19   . Poor historian 06/22/2019  . Schizophrenia [facility])     Past Surgical History:  Procedure Laterality Date  . COLONOSCOPY WITH PROPOFOL N/A 09/29/2019   Procedure: COLONOSCOPY WITH PROPOFOL;  Surgeon: Toney Reil, MD;  Location[facility] ENDOSCOPY;  Service: Gastroenterology;  Laterality: N/A;  .  ESOPHAGOGASTRODUODENOSCOPY (EGD) WITH PROPOFOL N/A 09/29/2019   Procedure: ESOPHAGOGASTRODUODENOSCOPY (EGD) WITH PROPOFOL;  Surgeon: Toney Reil, MD;  Location: [facility] ENDOSCOPY;  Service: Gastroenterology;  Laterality: N/A;    Family History:  Family History  Problem Relation Age of Onset  . Post-traumatic stress disorder Mother   . Depression Mother   . Depression Maternal Uncle   . Cancer Maternal Grandmother     Social History:  reports that she has been smoking cigarettes. She has a 5.50 pack-year smoking history. She has never used smokeless tobacco. She reports previous drug use. Drug: Marijuana. She reports that she does not drink alcohol.  Additional Social History:  Alcohol / Drug Use Pain Medications: See MAR Prescriptions: See MAR Over the Counter: See MAR History of alcohol / drug use?: No history of alcohol / drug abuse  CIWA: CIWA-Ar BP: (!) 131/91 Pulse Rate: 99 COWS:    Allergies:  Allergies  Allergen Reactions  . Penicillins Diarrhea and Nausea Only    Has patient had a PCN reaction causing immediate rash, facial/tongue/throat swelling, SOB or lightheadedness with hypotension:  no Has patient had a PCN reaction causing severe rash involving mucus membranes or skin necrosis:  no Has patient had a PCN reaction that required hospitalization: no Has patient had a PCN reaction occurring within the last 10 years: no If all of the above answers are "NO", then may proceed  with Cephalosporin use.     Home Medications: (Not in a hospital admission)   OB/GYN Status:  Patient's last menstrual period was 11/22/2019 (exact date).  General Assessment Data Location of Assessment: [facility] ED TTS Assessment: In system Is this a Tele or Face-to-Face Assessment?: Face-to-Face Is this an Initial Assessment or a Re-assessment for this encounter?: Initial Assessment Patient Accompanied by:: N/A Language Other than English: No Living Arrangements: In Group Home:  (Comment: Name of Group Home)([facility]) What gender do you identify as?: Female Marital status: Single Pregnancy Status: No Living Arrangements: Group Home Can pt return to current living arrangement?: Yes(Group home has given patient a 30 day notice) Admission Status: Voluntary Is patient capable of signing voluntary admission?: Yes Referral Source: Self/Family/Friend Insurance type: Medicare  Medical Screening Exam ([place]) Medical Exam completed: Yes  Crisis Care Plan Living Arrangements: Group Home Legal Guardian: Other:([place]) Name of Psychiatrist: Unknown Name of Therapist: Unknown  Education Status Is patient currently in school?: No Is the patient employed, unemployed or receiving disability?: Receiving disability income  Risk to self with the past 6 months Suicidal Ideation: No-Not Currently/Within Last 6 Months Has patient been a risk to self within the past 6 months prior to admission? : Yes Suicidal Intent: No-Not Currently/Within Last 6 Months Has patient had any suicidal intent within the past 6 months prior to admission? : Yes Is patient at risk for suicide?: No Suicidal Plan?: No-Not Currently/Within Last 6 Months Has patient had any suicidal plan within the past 6 months prior to admission? : Yes Access to Means: No What has been your use of drugs/alcohol within the last 12 months?: None reported Previous Attempts/Gestures: No Triggers for Past Attempts: None known Intentional Self Injurious Behavior: None Family Suicide History: Unknown Recent stressful life event(s): Conflict (Comment)(Conflict with group home) Persecutory voices/beliefs?: No Depression: No Substance abuse history and/or treatment for substance abuse?: No Suicide prevention information given to non-admitted patients: Not applicable  Risk to Others within the past 6 months Homicidal Ideation: No Does patient have any lifetime risk of  violence toward others beyond the six months prior to admission? : No Thoughts of Harm to Others: No Current Homicidal Intent: No Current Homicidal Plan: No Access to Homicidal Means: No History of harm to others?: No Assessment of Violence: None Noted Does patient have access to weapons?: No Criminal Charges Pending?: No Does patient have a court date: No Is patient on probation?: No  Psychosis Hallucinations: None noted Delusions: None noted  Mental Status Report Appearance/Hygiene: In scrubs Eye Contact: Fair Motor Activity: Freedom of movement Speech: Logical/coherent Level of Consciousness: Drowsy Mood: Threatening, Anxious Affect: Appropriate to circumstance Anxiety Level: Moderate Thought Processes: Coherent Judgement: Unimpaired Orientation: Person, Place, Time, Situation, Appropriate for developmental age Obsessive Compulsive Thoughts/Behaviors: None  Cognitive Functioning Concentration: Normal Memory: Recent Intact, Remote Intact Is patient IDD: No Insight: Fair Impulse Control: Poor Appetite: Good Have you had any weight changes? : No Change Sleep: No Change Total Hours of Sleep: 6 Vegetative Symptoms: None  ADLScreening [facility] Assessment Services) Patient's cognitive ability adequate to safely complete daily activities?: Yes Patient able to express need for assistance with ADLs?: Yes Independently performs ADLs?: Yes (appropriate for developmental age)  Prior Inpatient Therapy Prior Inpatient Therapy: Yes Prior Therapy Dates: 11/30/19 Prior Therapy Facilty/Provider(s): [facility] Reason for Treatment: SI  Prior Outpatient Therapy Prior Outpatient Therapy: Yes Prior Therapy Dates: Current Prior Therapy Facilty/Provider(s): Unknown Reason for Treatment: Depression  Does patient have an ACCT team?: Yes Does patient have Intensive In-House Services?  : No Does patient have [facility] services? : No Does patient have P4CC services?: No  ADL Screening  (condition at time of admission) Patient's cognitive ability adequate to safely complete daily activities?: Yes Is the patient deaf or have difficulty hearing?: No Does the patient have difficulty seeing, even when wearing glasses/contacts?: No Does the patient have difficulty concentrating, remembering, or making decisions?: No Patient able to express need for assistance with ADLs?: Yes Does the patient have difficulty dressing or bathing?: No Independently performs ADLs?: Yes (appropriate for developmental age) Does the patient have difficulty walking or climbing stairs?: No Weakness of Legs: None Weakness of Arms/Hands: None  Home Assistive Devices/Equipment Home Assistive Devices/Equipment: None  Therapy Consults (therapy consults require a physician order) PT Evaluation Needed: No OT Evalulation Needed: No SLP Evaluation Needed: No Abuse/Neglect Assessment (Assessment to be complete while patient is alone) Physical Abuse: Denies Verbal Abuse: Denies Sexual Abuse: Denies Exploitation of patient/patient's resources: Denies Self-Neglect: Denies Values / Beliefs Cultural Requests During Hospitalization: None Spiritual Requests During Hospitalization: None Consults Spiritual Care Consult Needed: No Transition of Care Team Consult Needed: No Advance Directives (For Healthcare) Does Patient Have a Medical Advance Directive?: No Would patient like information on creating a medical advance directive?: No - Patient declined          Disposition: Per Psyc NP the patient does not meet the criteria for psychiatric inpatient admission, and she will be discharged back to her group home.  Disposition Initial Assessment Completed for this Encounter: Yes  On Site Evaluation by:   Reviewed with Physician:    Benay Pike MS LCASA 12/14/2019 3:48 AM

## 2020-01-11 MED ORDER — BUSPIRONE HCL 10 MG PO TABS
10.00 | ORAL_TABLET | ORAL | Status: DC
Start: 2020-01-11 — End: 2020-01-11

## 2020-01-11 MED ORDER — ACETAMINOPHEN 325 MG PO TABS
650.00 | ORAL_TABLET | ORAL | Status: DC
Start: ? — End: 2020-01-11

## 2020-01-11 MED ORDER — LITHIUM CARBONATE ER 300 MG PO TBCR
600.00 | EXTENDED_RELEASE_TABLET | ORAL | Status: DC
Start: 2020-01-11 — End: 2020-01-11

## 2020-01-11 MED ORDER — DSS 100 MG PO CAPS
100.00 | ORAL_CAPSULE | ORAL | Status: DC
Start: 2020-01-11 — End: 2020-01-11

## 2020-01-11 MED ORDER — ALUM & MAG HYDROXIDE-SIMETH 400-400-40 MG/5ML PO SUSP
30.00 | ORAL | Status: DC
Start: ? — End: 2020-01-11

## 2020-01-11 MED ORDER — PANTOPRAZOLE SODIUM 40 MG PO TBEC
40.00 | DELAYED_RELEASE_TABLET | ORAL | Status: DC
Start: 2020-01-12 — End: 2020-01-11

## 2020-01-11 MED ORDER — DIPHENHYDRAMINE HCL 25 MG PO CAPS
50.00 | ORAL_CAPSULE | ORAL | Status: DC
Start: ? — End: 2020-01-11

## 2020-01-11 MED ORDER — DICYCLOMINE HCL 10 MG PO CAPS
10.00 | ORAL_CAPSULE | ORAL | Status: DC
Start: 2020-01-12 — End: 2020-01-11

## 2020-01-11 MED ORDER — TRAZODONE HCL 50 MG PO TABS
50.00 | ORAL_TABLET | ORAL | Status: DC
Start: 2020-01-11 — End: 2020-01-11

## 2020-01-11 MED ORDER — NICOTINE POLACRILEX 4 MG MT GUM
4.00 | CHEWING_GUM | OROMUCOSAL | Status: DC
Start: ? — End: 2020-01-11

## 2020-01-11 MED ORDER — FENOFIBRATE 48 MG PO TABS
160.00 | ORAL_TABLET | ORAL | Status: DC
Start: 2020-01-12 — End: 2020-01-11

## 2020-01-11 MED ORDER — CLOZAPINE 100 MG PO TABS
450.00 | ORAL_TABLET | ORAL | Status: DC
Start: 2020-01-11 — End: 2020-01-11

## 2020-01-11 MED ORDER — HALOPERIDOL 5 MG PO TABS
5.00 | ORAL_TABLET | ORAL | Status: DC
Start: 2020-01-11 — End: 2020-01-11

## 2020-01-11 MED ORDER — BROMOCRIPTINE MESYLATE 2.5 MG PO TABS
2.50 | ORAL_TABLET | ORAL | Status: DC
Start: 2020-01-11 — End: 2020-01-11

## 2021-09-23 IMAGING — CT CT ABD-PELV W/ CM
2 of 4 series · 16 of 46 positions shown, 18 images · IV contrast (APPLIED)
Comparison: None.

CLINICAL DATA: Acute abdominal pain. Bloody emesis and diarrhea.
Diffuse pain.

EXAM:
CT ABDOMEN AND PELVIS WITH CONTRAST
TECHNIQUE: Multidetector CT imaging of the abdomen and pelvis was performed
using the standard protocol following bolus administration of
intravenous contrast.
CONTRAST:  100mL OMNIPAQUE IOHEXOL 300 MG/ML  SOLN

[Series 2: routine abd/pel with · axial · 0.76mm/px · z∈[-1152,-707]mm · 13 of 99 slices shown, 15 images]
[im 5/99  soft-tissue]
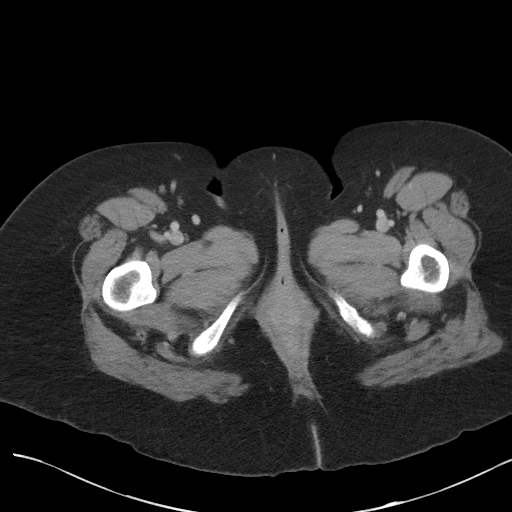
[im 5/99  bone]
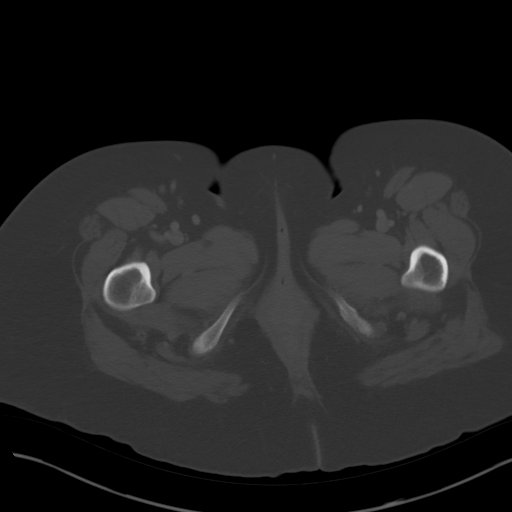
[im 14/99  soft-tissue]
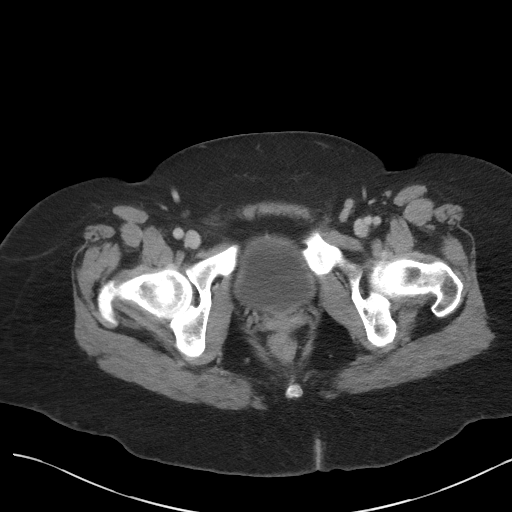
[im 23/99  soft-tissue]
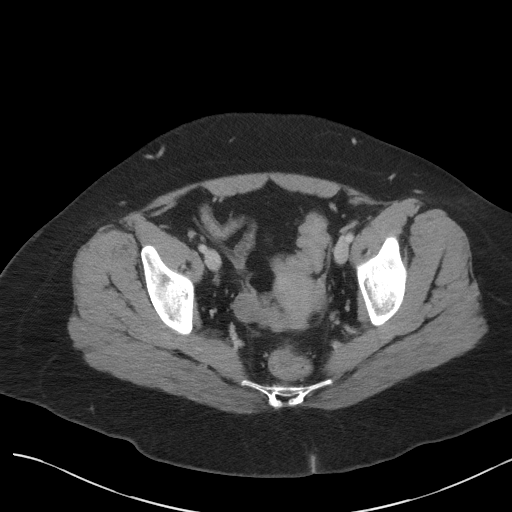
[im 27/99  soft-tissue]
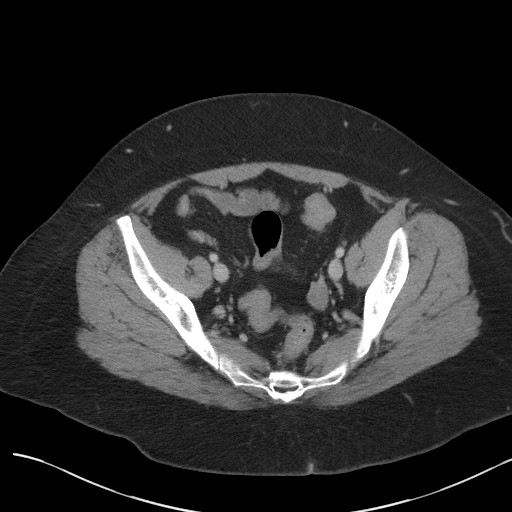
[im 36/99  soft-tissue]
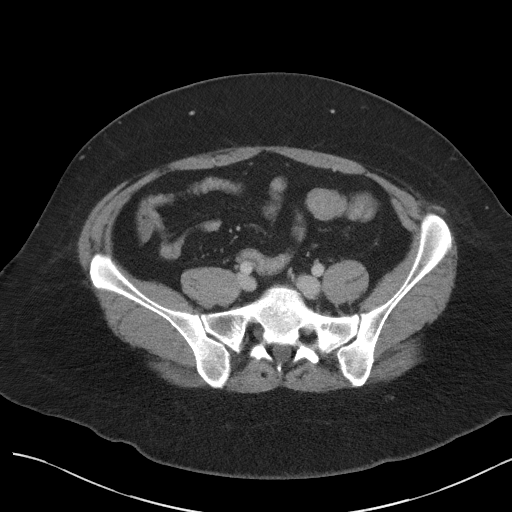
[im 41/99  soft-tissue]
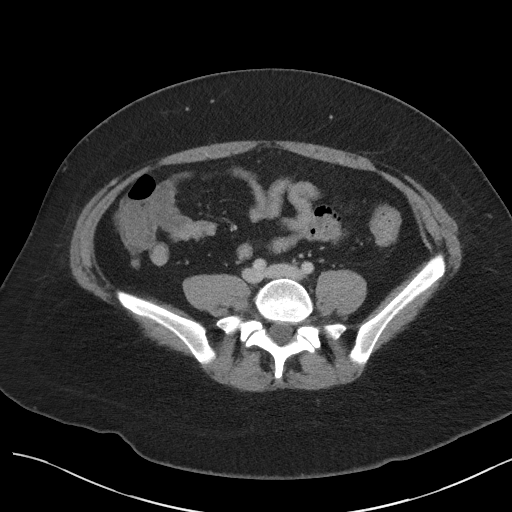
[im 49/99  soft-tissue]
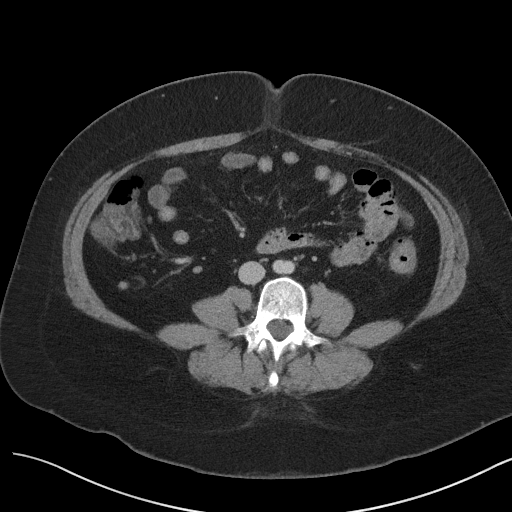
[im 58/99  soft-tissue]
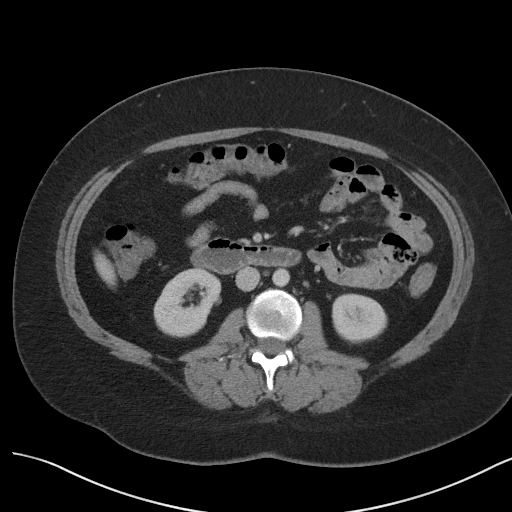
[im 63/99  soft-tissue]
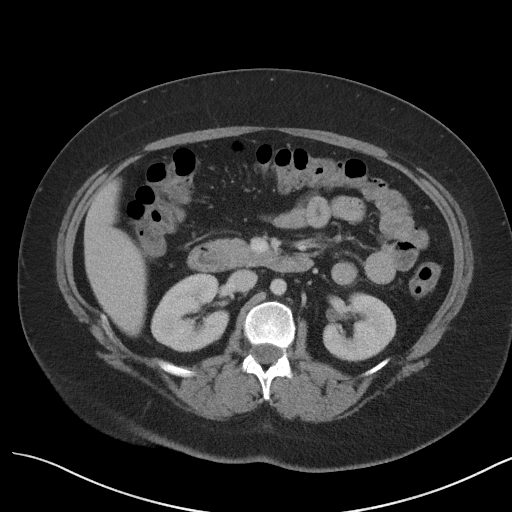
[im 63/99  bone]
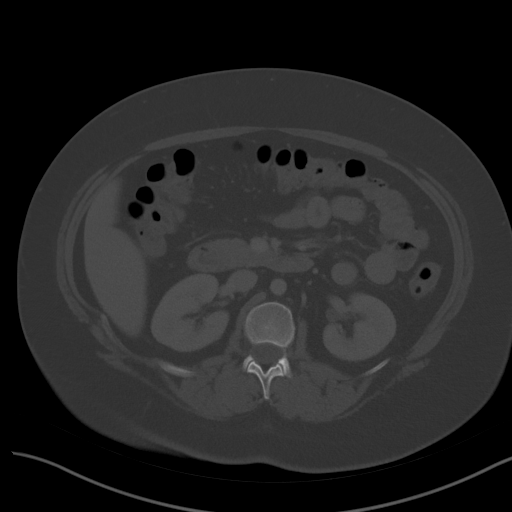
[im 72/99  soft-tissue]
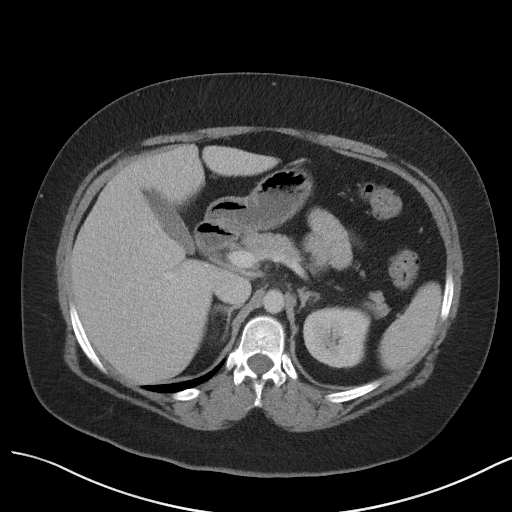
[im 76/99  soft-tissue]
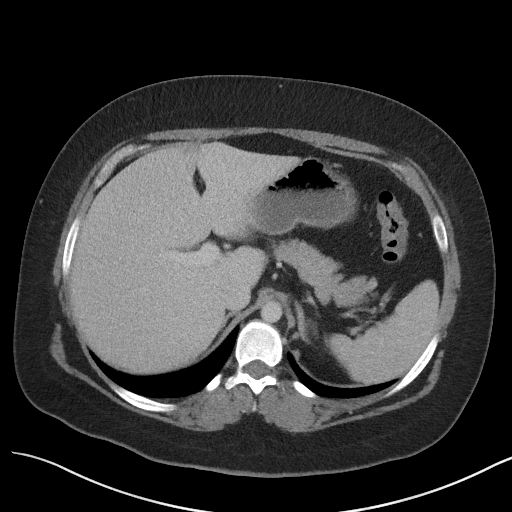
[im 85/99  soft-tissue]
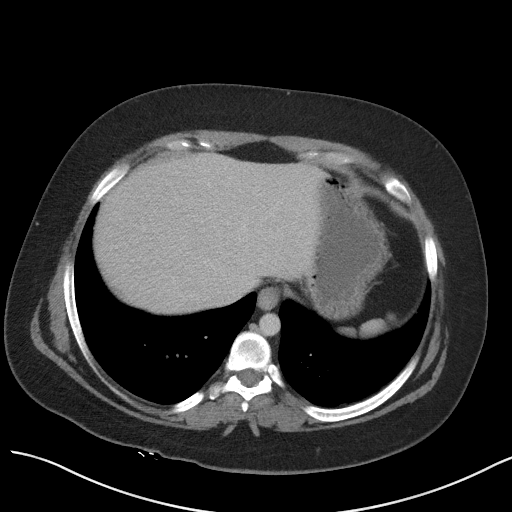
[im 94/99  soft-tissue]
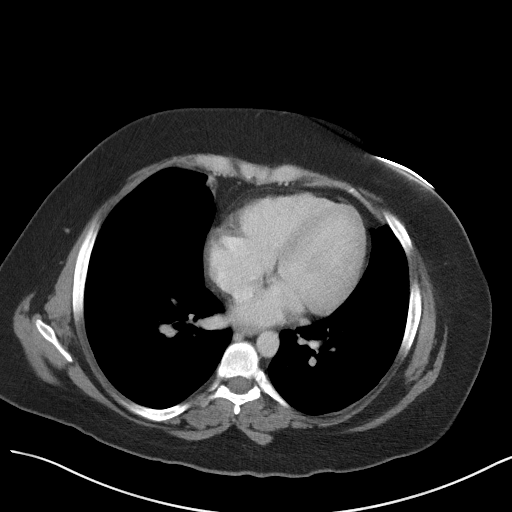

[Series 5: coronal st · coronal · 0.75mm/px · 3 of 102 slices shown]
[im 34/102  soft-tissue]
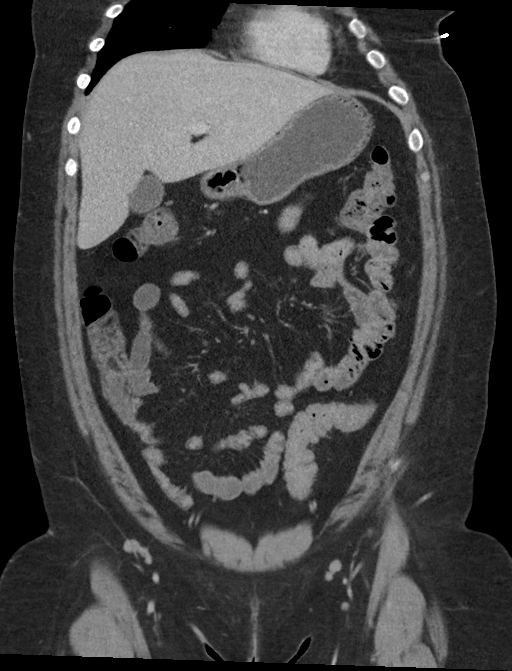
[im 45/102  soft-tissue]
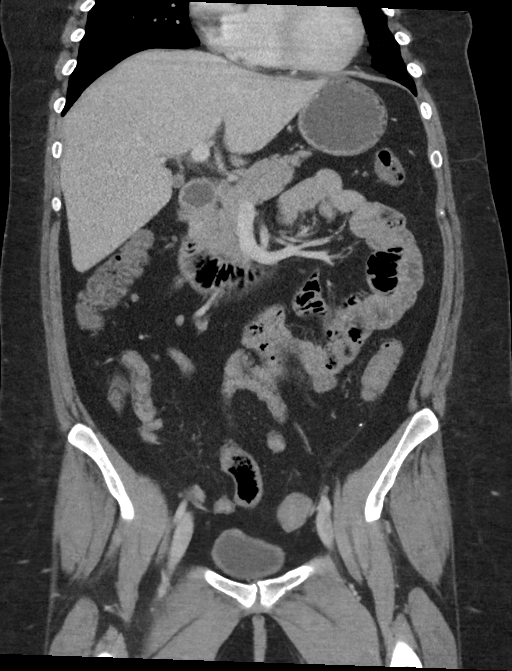
[im 57/102  soft-tissue]
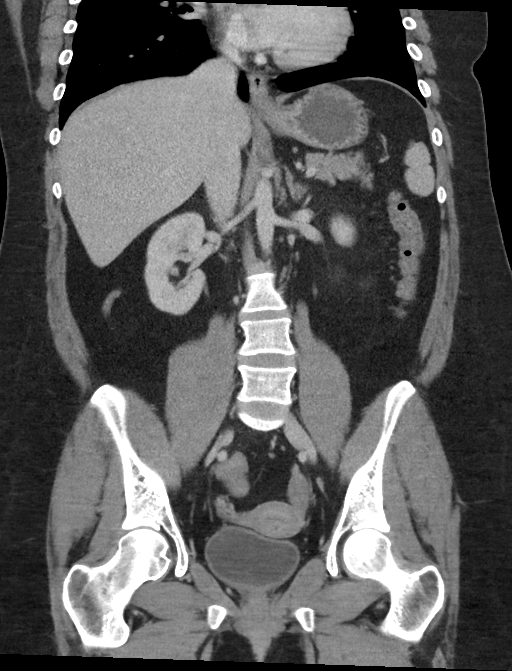

[16 of 46 positions shown; findings below may reference images not displayed]

FINDINGS: Lower chest: Minor hypoventilatory atelectasis at the left lung
base. No pleural fluid.

Hepatobiliary: Minimal focal fatty infiltration adjacent to the
falciform ligament. No suspicious hepatic lesion. Gallbladder
partially distended, no calcified stone. No biliary dilatation.

Pancreas: No ductal dilatation or inflammation.

Spleen: Normal in size without focal abnormality.

Adrenals/Urinary Tract: Normal adrenal glands. No hydronephrosis or
perinephric edema. Homogeneous renal enhancement. Urinary bladder is
physiologically distended without wall thickening.

Stomach/Bowel: Stomach physiologically distended. No perigastric
inflammation or wall thickening. No small bowel obstruction or
inflammation. No terminal ileal inflammation. Normal appendix. Mild
submucosal fatty infiltration of the ascending colon. Equivocal
transverse colonic wall thickening versus nondistention. No
pericolonic or perienteric inflammatory change. No mesenteric edema.

Vascular/Lymphatic: Portal vein is patent. Abdominal aorta and IVC
are intact. Few prominent ileocolic nodes measuring up to 10 mm,
likely reactive.

Reproductive: Uterus and bilateral adnexa are unremarkable.

Other: No free air, free fluid, or intra-abdominal fluid collection.
Small fat containing umbilical hernia.

Musculoskeletal: There are no acute or suspicious osseous
abnormalities.
IMPRESSION: Equivocal colonic wall thickening of the transverse colon without
significant pericolonic edema, possible colitis. No other acute
findings in the abdomen/pelvis.
